# Patient Record
Sex: Male | Born: 1950 | Race: White | Hispanic: No | Marital: Married | Smoking: Never smoker
Health system: Southern US, Community
[De-identification: ages and names within clinical notes are randomized; demographics above are authoritative.]

## PROBLEM LIST (undated history)

## (undated) DIAGNOSIS — E7800 Pure hypercholesterolemia, unspecified: Secondary | ICD-10-CM

## (undated) DIAGNOSIS — R011 Cardiac murmur, unspecified: Secondary | ICD-10-CM

## (undated) DIAGNOSIS — Q631 Lobulated, fused and horseshoe kidney: Secondary | ICD-10-CM

## (undated) DIAGNOSIS — K279 Peptic ulcer, site unspecified, unspecified as acute or chronic, without hemorrhage or perforation: Secondary | ICD-10-CM

## (undated) DIAGNOSIS — N209 Urinary calculus, unspecified: Secondary | ICD-10-CM

## (undated) DIAGNOSIS — I10 Essential (primary) hypertension: Secondary | ICD-10-CM

## (undated) DIAGNOSIS — I2510 Atherosclerotic heart disease of native coronary artery without angina pectoris: Secondary | ICD-10-CM

## (undated) DIAGNOSIS — K219 Gastro-esophageal reflux disease without esophagitis: Secondary | ICD-10-CM

## (undated) DIAGNOSIS — M1990 Unspecified osteoarthritis, unspecified site: Secondary | ICD-10-CM

## (undated) HISTORY — PX: KIDNEY STONE SURGERY: SHX686

## (undated) HISTORY — DX: Essential (primary) hypertension: I10

## (undated) HISTORY — PX: VASECTOMY: SHX75

## (undated) HISTORY — PX: CARPAL TUNNEL RELEASE: SHX101

## (undated) HISTORY — PX: KNEE ARTHROSCOPY: SUR90

## (undated) HISTORY — PX: CARDIAC CATHETERIZATION: SHX172

## (undated) HISTORY — PX: COLONOSCOPY: SHX174

## (undated) HISTORY — PX: OTHER SURGICAL HISTORY: SHX169

## (undated) HISTORY — DX: Peptic ulcer, site unspecified, unspecified as acute or chronic, without hemorrhage or perforation: K27.9

## (undated) HISTORY — DX: Pure hypercholesterolemia, unspecified: E78.00

---

## 2008-10-22 HISTORY — PX: CORONARY ARTERY BYPASS GRAFT: SHX141

## 2009-03-10 ENCOUNTER — Ambulatory Visit (HOSPITAL_COMMUNITY): Admission: RE | Admit: 2009-03-10 | Discharge: 2009-03-10 | Payer: Self-pay | Admitting: Urology

## 2009-07-02 ENCOUNTER — Encounter: Payer: Self-pay | Admitting: Cardiovascular Disease

## 2009-07-06 ENCOUNTER — Ambulatory Visit: Payer: Self-pay | Admitting: Cardiovascular Disease

## 2009-07-06 DIAGNOSIS — R079 Chest pain, unspecified: Secondary | ICD-10-CM

## 2009-07-06 DIAGNOSIS — I10 Essential (primary) hypertension: Secondary | ICD-10-CM | POA: Insufficient documentation

## 2009-07-06 DIAGNOSIS — E782 Mixed hyperlipidemia: Secondary | ICD-10-CM | POA: Insufficient documentation

## 2009-07-11 ENCOUNTER — Encounter: Payer: Self-pay | Admitting: Cardiovascular Disease

## 2009-07-11 ENCOUNTER — Ambulatory Visit: Payer: Self-pay | Admitting: Cardiology

## 2009-07-19 ENCOUNTER — Ambulatory Visit: Payer: Self-pay | Admitting: Cardiology

## 2009-07-19 ENCOUNTER — Encounter: Payer: Self-pay | Admitting: Adult Health

## 2009-07-19 ENCOUNTER — Ambulatory Visit (HOSPITAL_COMMUNITY): Admission: RE | Admit: 2009-07-19 | Discharge: 2009-07-19 | Payer: Self-pay | Admitting: Cardiology

## 2009-07-19 ENCOUNTER — Encounter (INDEPENDENT_AMBULATORY_CARE_PROVIDER_SITE_OTHER): Payer: Self-pay | Admitting: *Deleted

## 2009-07-19 LAB — CONVERTED CEMR LAB
Basophils Absolute: 0.1 10*3/uL (ref 0.0–0.1)
CO2: 30 meq/L (ref 19–32)
INR: 0.9 (ref 0.0–1.5)
MCHC: 35.3 g/dL (ref 30.0–36.0)
MCV: 92.9 fL (ref 78.0–100.0)
RDW: 13.5 % (ref 11.5–15.5)
Sodium: 138 meq/L (ref 135–145)
WBC: 7.2 10*3/uL (ref 4.0–10.5)
aPTT: 30 s (ref 24–37)

## 2009-07-21 ENCOUNTER — Ambulatory Visit: Payer: Self-pay | Admitting: Surgery

## 2009-07-21 ENCOUNTER — Ambulatory Visit: Payer: Self-pay | Admitting: Cardiology

## 2009-07-21 ENCOUNTER — Inpatient Hospital Stay (HOSPITAL_COMMUNITY): Admission: AD | Admit: 2009-07-21 | Discharge: 2009-07-26 | Payer: Self-pay | Admitting: Cardiology

## 2009-07-21 ENCOUNTER — Encounter: Payer: Self-pay | Admitting: Surgery

## 2009-07-21 ENCOUNTER — Inpatient Hospital Stay (HOSPITAL_BASED_OUTPATIENT_CLINIC_OR_DEPARTMENT_OTHER): Admission: RE | Admit: 2009-07-21 | Discharge: 2009-07-21 | Payer: Self-pay | Admitting: Cardiology

## 2009-07-25 ENCOUNTER — Encounter: Payer: Self-pay | Admitting: Cardiology

## 2009-07-31 ENCOUNTER — Encounter: Payer: Self-pay | Admitting: Cardiovascular Disease

## 2009-08-02 ENCOUNTER — Ambulatory Visit: Payer: Self-pay | Admitting: Surgery

## 2009-08-16 ENCOUNTER — Encounter: Admission: RE | Admit: 2009-08-16 | Discharge: 2009-08-16 | Payer: Self-pay | Admitting: Surgery

## 2009-08-17 ENCOUNTER — Ambulatory Visit: Payer: Self-pay | Admitting: Cardiovascular Disease

## 2009-08-19 ENCOUNTER — Ambulatory Visit: Payer: Self-pay | Admitting: Surgery

## 2009-08-25 ENCOUNTER — Encounter: Payer: Self-pay | Admitting: Cardiovascular Disease

## 2009-09-26 ENCOUNTER — Encounter: Payer: Self-pay | Admitting: Cardiovascular Disease

## 2009-10-07 ENCOUNTER — Telehealth: Payer: Self-pay | Admitting: Cardiovascular Disease

## 2009-10-18 ENCOUNTER — Encounter (INDEPENDENT_AMBULATORY_CARE_PROVIDER_SITE_OTHER): Payer: Self-pay | Admitting: *Deleted

## 2009-10-22 HISTORY — PX: HERNIA REPAIR: SHX51

## 2009-10-27 ENCOUNTER — Telehealth (INDEPENDENT_AMBULATORY_CARE_PROVIDER_SITE_OTHER): Payer: Self-pay | Admitting: *Deleted

## 2009-10-31 ENCOUNTER — Encounter: Payer: Self-pay | Admitting: Cardiovascular Disease

## 2009-11-08 ENCOUNTER — Ambulatory Visit: Payer: Self-pay | Admitting: Cardiovascular Disease

## 2009-11-21 ENCOUNTER — Telehealth: Payer: Self-pay | Admitting: Cardiovascular Disease

## 2009-11-28 ENCOUNTER — Telehealth (INDEPENDENT_AMBULATORY_CARE_PROVIDER_SITE_OTHER): Payer: Self-pay | Admitting: *Deleted

## 2009-12-15 ENCOUNTER — Telehealth: Payer: Self-pay | Admitting: Cardiovascular Disease

## 2009-12-26 ENCOUNTER — Telehealth: Payer: Self-pay | Admitting: Cardiovascular Disease

## 2010-01-05 IMAGING — CR DG CHEST 2V
2 series · 2 of 2 positions shown · non-contrast
Comparison: Chest x-ray of 07/25/2009

CLINICAL DATA: As post CABG, follow-up

CHEST - 2 VIEW

[view not recorded (1 of 2)]
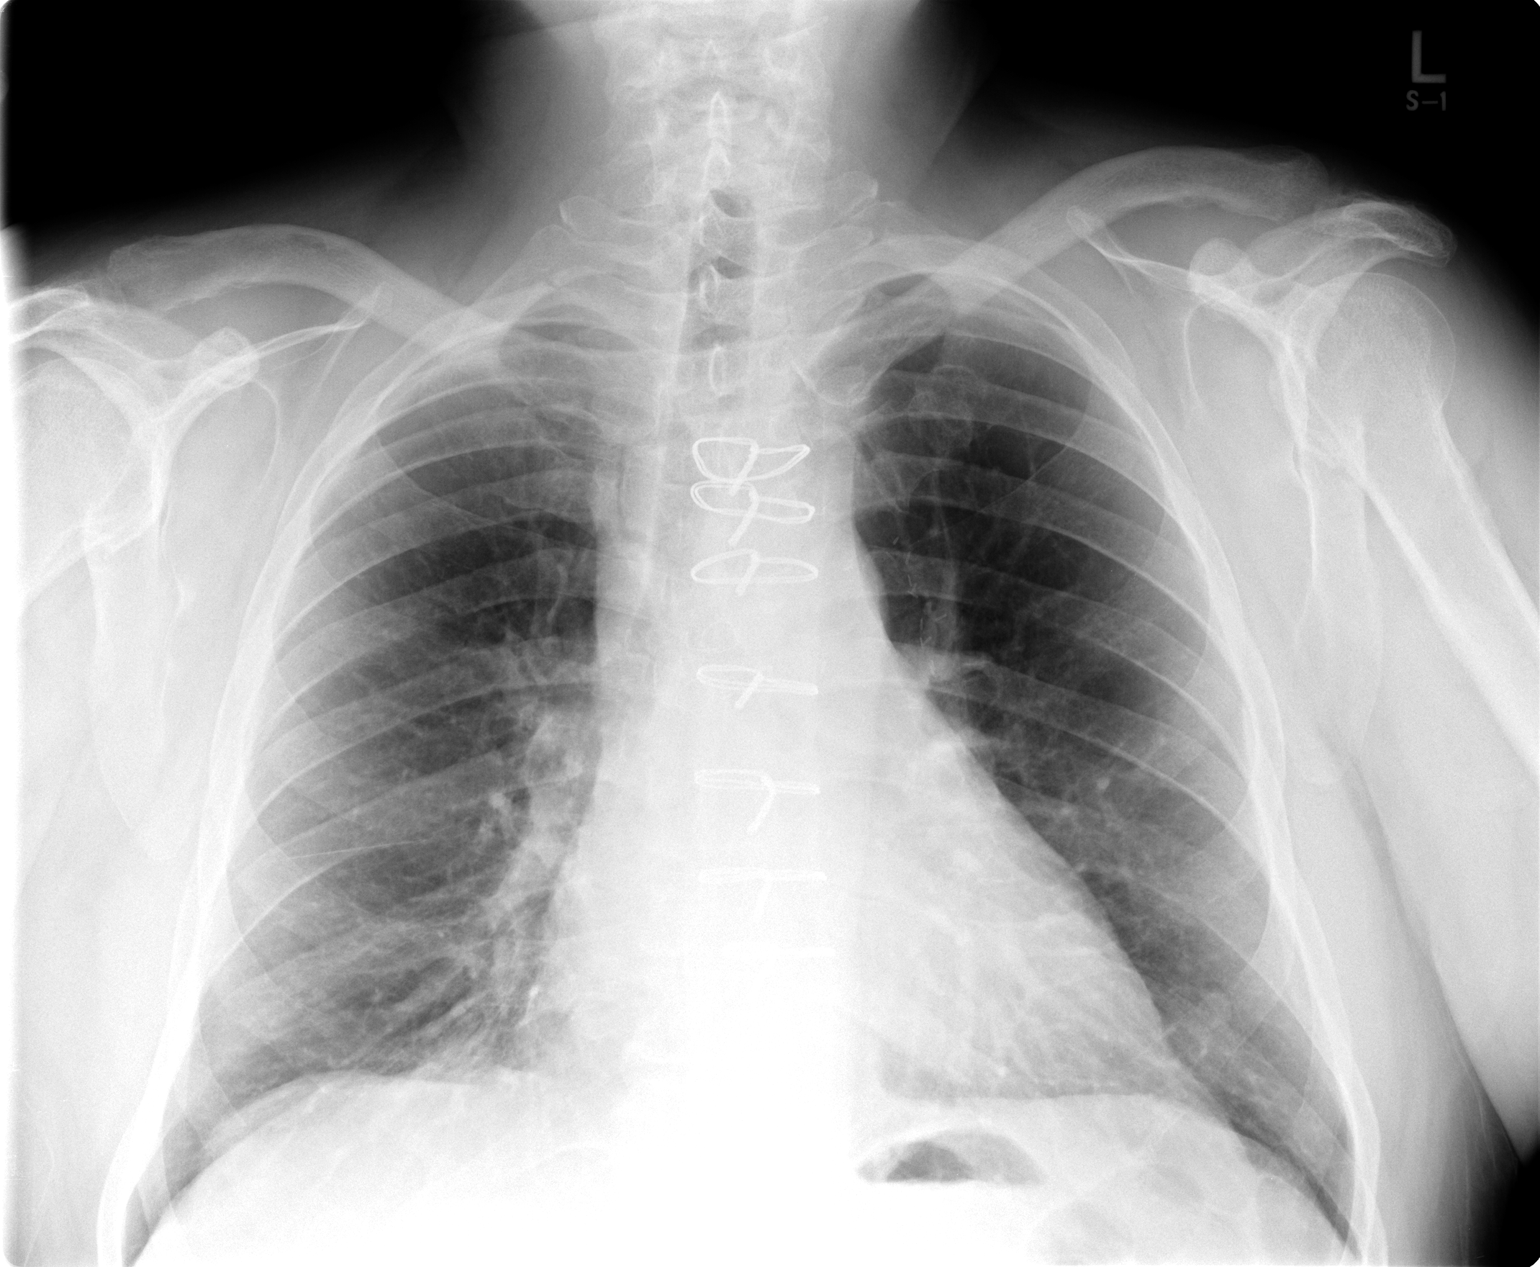

[view not recorded (2 of 2)]
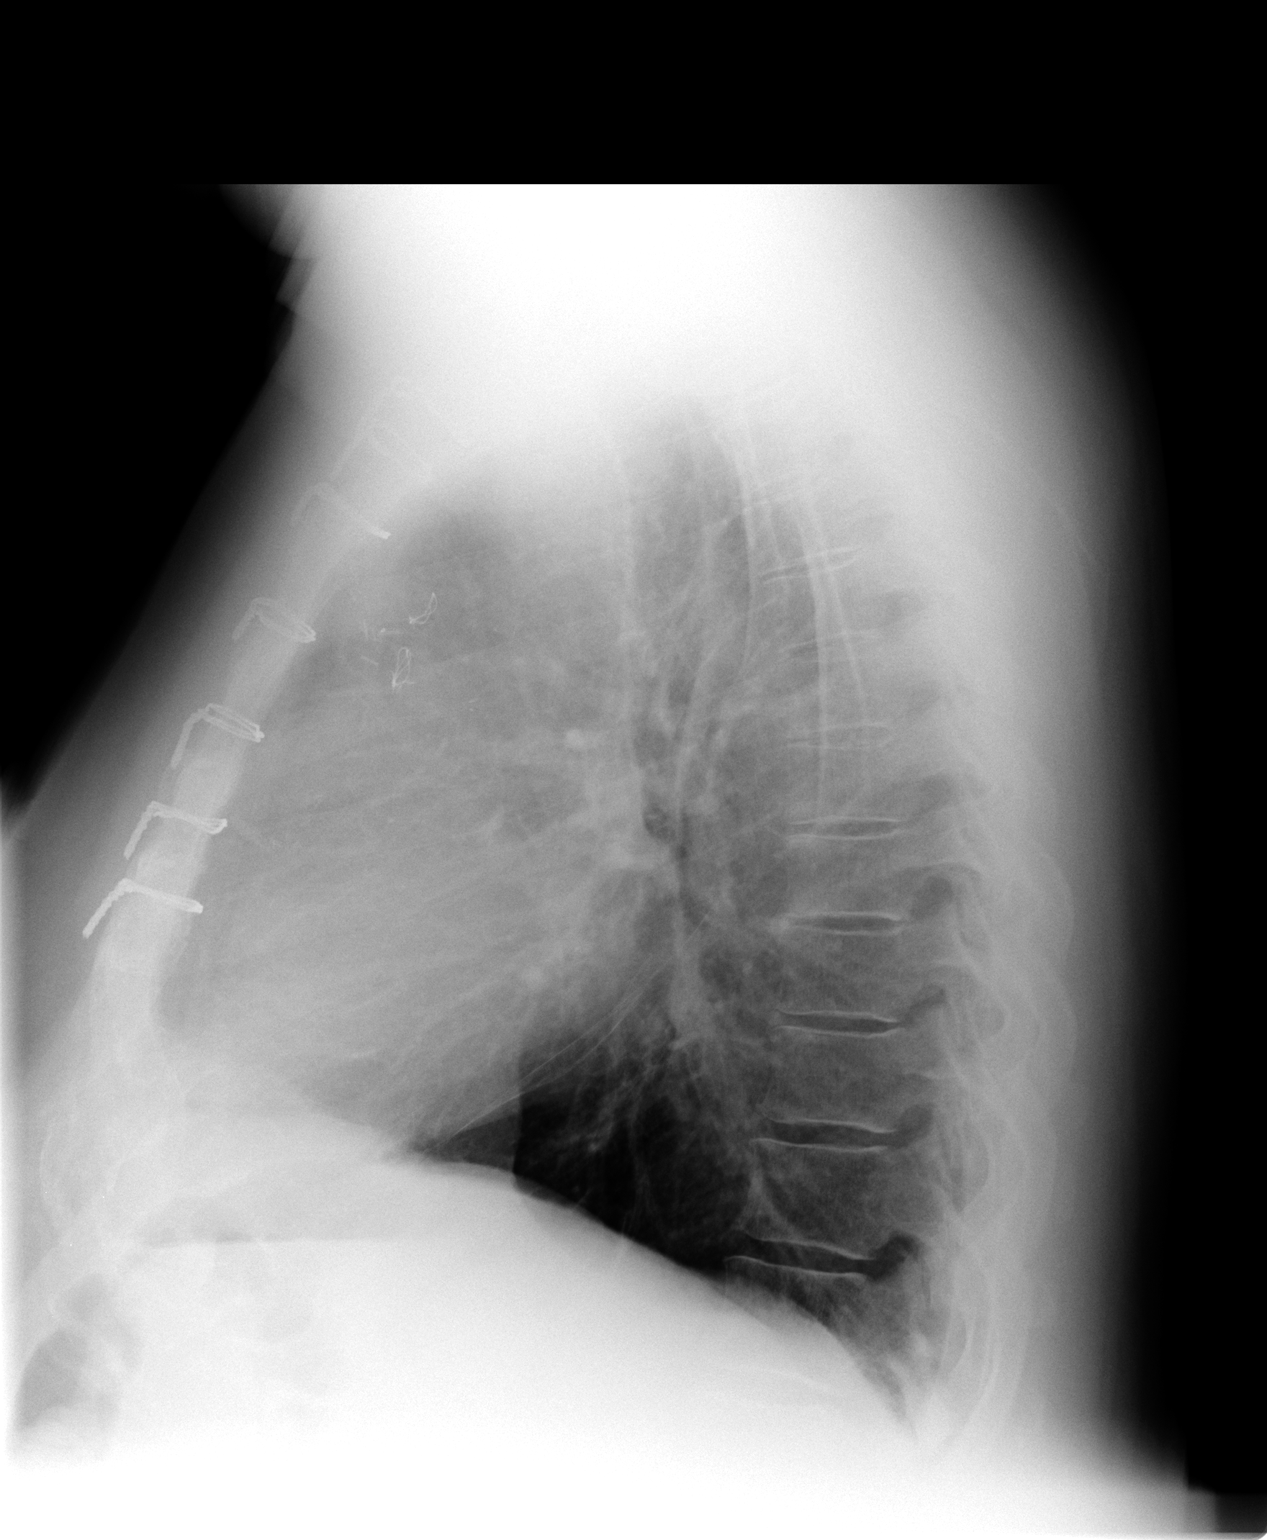

[2 of 2 positions shown; findings below may reference images not displayed]

FINDINGS: Aeration has improved.  There has been resolution of
bilateral pleural effusions and bibasilar atelectasis in the
interval.  Cardiomegaly is stable.  Median sternotomy sutures
appear intact.
IMPRESSION: Resolution of effusions and basilar atelectasis.

## 2010-05-08 ENCOUNTER — Ambulatory Visit: Payer: Self-pay | Admitting: Cardiovascular Disease

## 2010-11-21 NOTE — Progress Notes (Signed)
  recieved request for Records form [employer] forwarded to [place] for processing [facility]  November 28, 2009 10:22 AM

## 2010-11-21 NOTE — Progress Notes (Signed)
Summary: inflammatory meds  Phone Note Call from Patient Call back at Home Phone [phone]   Caller: Patient Reason for Call: Talk to Nurse Details for Reason: pt went to ortho md on last week for djd.. is possible for pt to go back on inflammatory meds.  Initial call taken by: Lorne Skeens,  November 21, 2009 9:11 AM  Follow-up for Phone Call        spoke with pt, okay given for him to take antiinflammatories. Deliah Goody, RN  November 21, 2009 10:09 AM

## 2010-11-21 NOTE — Progress Notes (Signed)
Summary: insurance letter/work  Phone Note Other Incoming Call back at [phone]   Caller: Arelina[employer] Summary of Call: checking on fax  she sent on 3/2 specialty letter for pt to go back to work Initial call taken by: Migdalia Dk,  December 26, 2009 9:07 AM  Follow-up for Phone Call        spoke with arelina, she will refax information Deliah Goody, RN  December 26, 2009 9:40 AM

## 2010-11-21 NOTE — Letter (Signed)
Summary: Dept. Of Transportation Request foe Additional Information  Dept. Of Transportation Request foe Additional Information   Imported By: Kassie Mends 11/10/2009 15:09:15  _____________________________________________________________________  External Attachment:    Type:   Image     Comment:   External Document

## 2010-11-21 NOTE — Assessment & Plan Note (Signed)
Summary: f48m/dm   Primary Provider:  Dr. Reuel Boom  CC:  chest pain.  History of Present Illness: Ricardo Jimenez is seen today post CABG 9/10 .  He was to have an outpatient cath but had resting pain and was cathed by Dr. Lillia Mountain with severe 3VD.  He had a total collaterilized LAD, 95% dominant circ and 90% non-dominant RCA that supplied his LAD collaterals.  He had a SVG to OM1, SVG OM3, SVG D1 and Lima to LAD.  I reviewed his cath films and hospital records.  He has recovered well andhas already been D/C by  Dr. Laneta Simmers . He is back working at [employer].   H e has some musculoskeletal pain but no dyspnea palpitations or edema.  he has been compliant with his meds. Dr Donzetta Sprung folows his lipids and LFTS twice a year.  He may need a left knee replacement He is cleared for surgery if need be.  He has had the right knee done in [place] before but will likely have this one in [place]  Current Problems (verified): 1)  Mixed Hyperlipidemia  (ICD-272.2) 2)  Essential Hypertension, Benign  (ICD-401.1) 3)  Chest Pain  (ICD-786.50)  Current Medications (verified): 1)  Multivitamins   Tabs (Multiple Vitamin) .[name].. 1 Tab By Mouth Once Daily 2)  Zantac 150 Mg Tabs (Ranitidine Hcl) .... Take 1 Tablet By Mouth Two Times A Day 3)  Fish Oil   Oil (Fish Oil) .[name].. 1 Tab By Mouth Once Daily 4)  Gabapentin 100 Mg Caps (Gabapentin) .... 6 Tabs By Mouth Once Daily 5)  Metoprolol Succinate 25 Mg Xr24h-Tab (Metoprolol Succinate) .[name].. 1 Tab By Mouth Two Times A Day 6)  Aspirin 325 Mg  Tabs (Aspirin) .[name].. 1 Tab By Mouth Once Daily 7)  Vytorin 10-40 Mg Tabs (Ezetimibe-Simvastatin) .... Take One Tablet By Mouth Dailyat Bedtime 8)  Colace 100 Mg Caps (Docusate Sodium) .... As Needed 9)  Tylenol Pm Extra Strength 500-25 Mg Tabs (Diphenhydramine-Apap (Sleep)) .... As Needed 10)  Tylenol Arthritis Pain 650 Mg Cr-Tabs (Acetaminophen) .... As Needed 11)  Potassium Chloride Cr 10 Meq Cr-Tabs (Potassium Chloride) .[name].. 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   Vital Signs:  Patient profile:   60 year old male Height:      69 inches Weight:      223 pounds BMI:     33.05 Pulse rate:   65 / minute Resp:     12 per minute BP sitting:   109 / 68  (left arm)  Vitals Entered By: Kem Parkinson (May 08, 2010 9:32 AM)  Physical Exam  General:  Affect appropriate Healthy:  appears stated age HEENT: normal Neck supple with no adenopathy JVP normal no bruits no thyromegaly Lungs clear with no wheezing and good diaphragmatic motion Heart:  S1/S2 no murmur,rub, gallop or click PMI normal Abdomen: benighn, BS positve, no tenderness, no AAA  no bruit.  No HSM or HJR Distal pulses intact with no bruits No edema Neuro non-focal Skin warm and dry S/P RTKR   Impression & Recommendations:  Problem # 1:  MIXED HYPERLIPIDEMIA (ICD-272.2) Continue statin labs per Dr Reuel Boom His updated medication list for this problem includes:    Vytorin 10-40 Mg Tabs (Ezetimibe-simvastatin) .[name]... Take one tablet by mouth dailyat bedtime  Problem # 2:  ESSENTIAL HYPERTENSION, BENIGN (ICD-401.1) Well controlled His updated medication list for this problem includes:    Metoprolol Succinate 25 Mg Xr24h-tab (Metoprolol succinate) .[name]... 1 tab by  mouth two times a day    Aspirin 325 Mg Tabs (Aspirin) .[name]... 1 tab by mouth once daily  Orders: EKG w/ Interpretation (93000)  Problem # 3:  CHEST PAIN (ICD-786.50) CABG 9/10.  Normal ECG with no angina His updated medication list for this problem includes:    Metoprolol Succinate 25 Mg Xr24h-tab (Metoprolol succinate) .[name]... 1 tab by mouth two times a day    Aspirin 325 Mg Tabs (Aspirin) .[name]... 1 tab by mouth once daily  Patient Instructions: 1)  Your physician recommends that you schedule a follow-up appointment in: YEAR WITH DR [place] Emms 2)  Your physician recommends that you continue on your current medications as directed. Please refer to the Current Medication list given to you today.   EKG Report  Procedure date:  05/08/2010  Findings:      NSR 65 Normal ECG

## 2010-11-21 NOTE — Progress Notes (Signed)
Summary: speak to nurse  Phone Note Call from Patient Call back at Home Phone [phone]   Caller: Patient Reason for Call: Talk to Nurse Summary of Call: req to speak to nurse Initial call taken by: Migdalia Dk,  December 15, 2009 10:46 AM  Follow-up for Phone Call        Called [employer]...form to Monia Pouch was signed on 12/07/2009 and mailed...patient aware. Follow-up by: Suzan Garibaldi RN

## 2010-11-21 NOTE — Progress Notes (Signed)
  Walk in Patient Form Recieved " Patient left Department of transporation Medical Examination" paper forwarded to [facility]. [facility] [place]  October 27, 2009 3:21 PM

## 2010-11-21 NOTE — Assessment & Plan Note (Signed)
Summary: F3M/DM   Primary Provider:  Dr. Reuel Boom  CC:  sob also pain in left arm.  History of Present Illness: Ricardo Jimenez is seen today post CABG.  He was to have an outpatient cath but had resting pain and was cathed by Dr. Lillia Mountain with severe 3VD.  He had a total collaterilized LAD, 95% dominant circ and 90% non-dominant RCA that supplied his LAD collaterals.  He had a SVG to OM1, SVG OM3, SVG D1 and Lima to LAD.  I reviewed his cath films and hospital records.  He has recovered well andhas already been D/C by  Dr. Laneta Simmers . He is back working at [employer].   H e has some musculoskeletal pain but no dyspnea palpitations or edema.  he has been compliant with his meds.  Current Problems (verified): 1)  Mixed Hyperlipidemia  (ICD-272.2) 2)  Essential Hypertension, Benign  (ICD-401.1) 3)  Chest Pain  (ICD-786.50)  Current Medications (verified): 1)  Multivitamins   Tabs (Multiple Vitamin) .[name].. 1 Tab By Mouth Once Daily 2)  Zantac 150 Mg Tabs (Ranitidine Hcl) .... Take 1 Tablet By Mouth Two Times A Day 3)  Fish Oil   Oil (Fish Oil) .[name].. 1 Tab By Mouth Once Daily 4)  Gabapentin 600 Mg Tabs (Gabapentin) .[name].. 1 Tab By Mouth Once Daily 5)  Metoprolol Succinate 25 Mg Xr24h-Tab (Metoprolol Succinate) .[name].. 1 Tab By Mouth Two Times A Day 6)  Aspirin 325 Mg  Tabs (Aspirin) .[name].. 1 Tab By Mouth Once Daily 7)  Vytorin 10-40 Mg Tabs (Ezetimibe-Simvastatin) .... Take One Tablet By Mouth Dailyat Bedtime 8)  Oxycodone-Acetaminophen 5-500 Mg Caps (Oxycodone-Acetaminophen) .... As Needed 9)  Colace 100 Mg Caps (Docusate Sodium) .... As Needed 10)  Tylenol Pm Extra Strength 500-25 Mg Tabs (Diphenhydramine-Apap (Sleep)) .... As Needed 11)  Tylenol Arthritis Pain 650 Mg Cr-Tabs (Acetaminophen) .... As Needed  Allergies (verified): No Known Drug Allergies  Past History:  Past Medical History: Last updated: 07/06/2009 Bilatateral Carpal Tunnel Surgery Right knee  replacement Arthritis HTN Hypercholesterolemia PUD  Past Surgical History: cath 07/21/2009 CABG  Review of Systems       Denies fever, malais, weight loss, blurry vision, decreased visual acuity, cough, sputum, SOB, hemoptysis, pleuritic pain, palpitaitons, heartburn, abdominal pain, melena, lower extremity edema, claudication, or rash. all other sytems reviewed and negative  Vital Signs:  Patient profile:   60 year old male Height:      69 inches Weight:      225 pounds BMI:     33.35 Pulse rate:   81 / minute Resp:     12 per minute BP sitting:   111 / 74  (left arm)  Vitals Entered By: Kem Parkinson (November 08, 2009 2:52 PM)  Physical Exam  General:  Affect appropriate Healthy:  appears stated age HEENT: normal Neck supple with no adenopathy Recent right hernia surgery with moderate calcified hematoma under incision and no signs of  infection JVP normal no bruits no thyromegaly Lungs clear with no wheezing and good diaphragmatic motion Heart:  S1/S2 no murmur,rub, gallop or click PMI normal Abdomen: benighn, BS positve, no tenderness, no AAA no bruit.  No HSM or HJR Distal pulses intact with no bruits No edema Neuro non-focal Skin warm and dry    Impression & Recommendations:  Problem # 1:  CHEST PAIN (ICD-786.50) S/P CABG 04/2009.  Normal LV function.  Activity currently limited by recent hernia surgery His updated medication list for this problem includes:    Metoprolol  Succinate 25 Mg Xr24h-tab (Metoprolol succinate) .[name]... 1 tab by mouth two times a day    Aspirin 325 Mg Tabs (Aspirin) .[name]... 1 tab by mouth once daily  Problem # 2:  ESSENTIAL HYPERTENSION, BENIGN (ICD-401.1) Well controlled continue low soidum diet His updated medication list for this problem includes:    Metoprolol Succinate 25 Mg Xr24h-tab (Metoprolol succinate) .[name]... 1 tab by mouth two times a day    Aspirin 325 Mg Tabs (Aspirin) .[name]... 1 tab by mouth once daily  Problem # 3:   MIXED HYPERLIPIDEMIA (ICD-272.2) Lipid and liver in 6 months His updated medication list for this problem includes:    Vytorin 10-40 Mg Tabs (Ezetimibe-simvastatin) .[name]... Take one tablet by mouth dailyat bedtime  Patient Instructions: 1)  Your physician recommends that you schedule a follow-up appointment in: 6 months

## 2011-01-25 LAB — CBC
HCT: 34.4 % — ABNORMAL LOW (ref 39.0–52.0)
HCT: 34.8 % — ABNORMAL LOW (ref 39.0–52.0)
HCT: 35.3 % — ABNORMAL LOW (ref 39.0–52.0)
HCT: 36.4 % — ABNORMAL LOW (ref 39.0–52.0)
HCT: 44.5 % (ref 39.0–52.0)
Hemoglobin: 11.1 g/dL — ABNORMAL LOW (ref 13.0–17.0)
Hemoglobin: 12 g/dL — ABNORMAL LOW (ref 13.0–17.0)
Hemoglobin: 12.1 g/dL — ABNORMAL LOW (ref 13.0–17.0)
Hemoglobin: 12.2 g/dL — ABNORMAL LOW (ref 13.0–17.0)
Hemoglobin: 15.7 g/dL (ref 13.0–17.0)
MCHC: 34.4 g/dL (ref 30.0–36.0)
MCHC: 35 g/dL (ref 30.0–36.0)
MCHC: 35.1 g/dL (ref 30.0–36.0)
MCHC: 35.3 g/dL (ref 30.0–36.0)
MCV: 92.9 fL (ref 78.0–100.0)
MCV: 93.1 fL (ref 78.0–100.0)
MCV: 93.3 fL (ref 78.0–100.0)
MCV: 93.3 fL (ref 78.0–100.0)
MCV: 94.2 fL (ref 78.0–100.0)
MCV: 94.8 fL (ref 78.0–100.0)
Platelets: 110 10*3/uL — ABNORMAL LOW (ref 150–400)
Platelets: 122 10*3/uL — ABNORMAL LOW (ref 150–400)
Platelets: 126 10*3/uL — ABNORMAL LOW (ref 150–400)
Platelets: 130 10*3/uL — ABNORMAL LOW (ref 150–400)
Platelets: 144 10*3/uL — ABNORMAL LOW (ref 150–400)
RBC: 3.41 MIL/uL — ABNORMAL LOW (ref 4.22–5.81)
RBC: 3.72 MIL/uL — ABNORMAL LOW (ref 4.22–5.81)
RBC: 3.73 MIL/uL — ABNORMAL LOW (ref 4.22–5.81)
RBC: 3.74 MIL/uL — ABNORMAL LOW (ref 4.22–5.81)
RDW: 13.1 % (ref 11.5–15.5)
RDW: 13.2 % (ref 11.5–15.5)
RDW: 13.3 % (ref 11.5–15.5)
RDW: 13.6 % (ref 11.5–15.5)
RDW: 13.7 % (ref 11.5–15.5)
WBC: 10.2 10*3/uL (ref 4.0–10.5)
WBC: 6.3 10*3/uL (ref 4.0–10.5)
WBC: 8.8 10*3/uL (ref 4.0–10.5)
WBC: 8.9 10*3/uL (ref 4.0–10.5)
WBC: 9.1 10*3/uL (ref 4.0–10.5)
WBC: 9.2 10*3/uL (ref 4.0–10.5)

## 2011-01-25 LAB — COMPREHENSIVE METABOLIC PANEL
ALT: 27 U/L (ref 0–53)
Albumin: 3.8 g/dL (ref 3.5–5.2)
BUN: 14 mg/dL (ref 6–23)
CO2: 26 mEq/L (ref 19–32)
Chloride: 100 mEq/L (ref 96–112)
Creatinine, Ser: 1 mg/dL (ref 0.4–1.5)
GFR calc Af Amer: >60 mL/min (ref >60)
Sodium: 136 mEq/L (ref 135–145)
Total Bilirubin: 1.6 mg/dL — ABNORMAL HIGH (ref 0.3–1.2)
Total Protein: 6.8 g/dL (ref 6.0–8.3)

## 2011-01-25 LAB — PROTIME-INR: INR: 1.3 (ref 0.00–1.49)

## 2011-01-25 LAB — CREATININE, SERUM
GFR calc Af Amer: >60 mL/min (ref >60)
GFR calc non Af Amer: >60 mL/min (ref >60)

## 2011-01-25 LAB — POCT I-STAT 4, (NA,K, GLUC, HGB,HCT)
Glucose, Bld: 107 mg/dL — ABNORMAL HIGH (ref 70–99)
Glucose, Bld: 120 mg/dL — ABNORMAL HIGH (ref 70–99)
Glucose, Bld: 93 mg/dL (ref 70–99)
Glucose, Bld: 97 mg/dL (ref 70–99)
HCT: 27 % — ABNORMAL LOW (ref 39.0–52.0)
HCT: 39 % (ref 39.0–52.0)
HCT: 42 % (ref 39.0–52.0)
Hemoglobin: 10.9 g/dL — ABNORMAL LOW (ref 13.0–17.0)
Hemoglobin: 9.2 g/dL — ABNORMAL LOW (ref 13.0–17.0)
Hemoglobin: 9.9 g/dL — ABNORMAL LOW (ref 13.0–17.0)
Potassium: 3.8 mEq/L (ref 3.5–5.1)
Potassium: 4.1 mEq/L (ref 3.5–5.1)
Potassium: 4.9 mEq/L (ref 3.5–5.1)
Potassium: 5.4 mEq/L — ABNORMAL HIGH (ref 3.5–5.1)
Sodium: 129 mEq/L — ABNORMAL LOW (ref 135–145)
Sodium: 131 mEq/L — ABNORMAL LOW (ref 135–145)
Sodium: 138 mEq/L (ref 135–145)

## 2011-01-25 LAB — POCT I-STAT, CHEM 8
BUN: 17 mg/dL (ref 6–23)
Calcium, Ion: 1.16 mmol/L (ref 1.12–1.32)
Chloride: 104 mEq/L (ref 96–112)
Chloride: 99 mEq/L (ref 96–112)
Creatinine, Ser: 0.9 mg/dL (ref 0.4–1.5)
Glucose, Bld: 126 mg/dL — ABNORMAL HIGH (ref 70–99)
HCT: 34 % — ABNORMAL LOW (ref 39.0–52.0)
Hemoglobin: 11.6 g/dL — ABNORMAL LOW (ref 13.0–17.0)
Potassium: 4.8 mEq/L (ref 3.5–5.1)
TCO2: 24 mmol/L (ref 0–100)

## 2011-01-25 LAB — GLUCOSE, CAPILLARY
Glucose-Capillary: 117 mg/dL — ABNORMAL HIGH (ref 70–99)
Glucose-Capillary: 117 mg/dL — ABNORMAL HIGH (ref 70–99)
Glucose-Capillary: 123 mg/dL — ABNORMAL HIGH (ref 70–99)

## 2011-01-25 LAB — BASIC METABOLIC PANEL
BUN: 13 mg/dL (ref 6–23)
BUN: 13 mg/dL (ref 6–23)
BUN: 20 mg/dL (ref 6–23)
CO2: 26 mEq/L (ref 19–32)
CO2: 29 mEq/L (ref 19–32)
Calcium: 8.1 mg/dL — ABNORMAL LOW (ref 8.4–10.5)
Calcium: 8.1 mg/dL — ABNORMAL LOW (ref 8.4–10.5)
Chloride: 104 mEq/L (ref 96–112)
Chloride: 96 mEq/L (ref 96–112)
Chloride: 97 mEq/L (ref 96–112)
Creatinine, Ser: 0.88 mg/dL (ref 0.4–1.5)
Creatinine, Ser: 0.89 mg/dL (ref 0.4–1.5)
Creatinine, Ser: 1 mg/dL (ref 0.4–1.5)
GFR calc Af Amer: >60 mL/min (ref >60)
GFR calc Af Amer: >60 mL/min (ref >60)
GFR calc non Af Amer: >60 mL/min (ref >60)
GFR calc non Af Amer: >60 mL/min (ref >60)
GFR calc non Af Amer: >60 mL/min (ref >60)
Glucose, Bld: 105 mg/dL — ABNORMAL HIGH (ref 70–99)
Glucose, Bld: 133 mg/dL — ABNORMAL HIGH (ref 70–99)
Glucose, Bld: 141 mg/dL — ABNORMAL HIGH (ref 70–99)
Potassium: 4.2 mEq/L (ref 3.5–5.1)
Potassium: 4.3 mEq/L (ref 3.5–5.1)
Sodium: 131 mEq/L — ABNORMAL LOW (ref 135–145)
Sodium: 132 mEq/L — ABNORMAL LOW (ref 135–145)

## 2011-01-25 LAB — MAGNESIUM
Magnesium: 2.2 mg/dL (ref 1.5–2.5)
Magnesium: 2.2 mg/dL (ref 1.5–2.5)
Magnesium: 2.7 mg/dL — ABNORMAL HIGH (ref 1.5–2.5)

## 2011-01-25 LAB — POCT I-STAT 3, ART BLOOD GAS (G3+)
Bicarbonate: 24.6 mEq/L — ABNORMAL HIGH (ref 20.0–24.0)
O2 Saturation: 95 %
O2 Saturation: 96 %
TCO2: 26 mmol/L (ref 0–100)
pCO2 arterial: 33.8 mmHg — ABNORMAL LOW (ref 35.0–45.0)
pCO2 arterial: 44.6 mmHg (ref 35.0–45.0)
pO2, Arterial: 327 mmHg — ABNORMAL HIGH (ref 80.0–100.0)
pO2, Arterial: 69 mmHg — ABNORMAL LOW (ref 80.0–100.0)
pO2, Arterial: 86 mmHg (ref 80.0–100.0)

## 2011-01-25 LAB — POCT I-STAT GLUCOSE: Operator id: 3406

## 2011-01-25 LAB — APTT: aPTT: 32 seconds (ref 24–37)

## 2011-01-25 LAB — TSH: TSH: 5.598 u[IU]/mL — ABNORMAL HIGH (ref 0.350–4.500)

## 2011-01-25 LAB — HEMOGLOBIN AND HEMATOCRIT, BLOOD
HCT: 29.9 % — ABNORMAL LOW (ref 39.0–52.0)
Hemoglobin: 10.4 g/dL — ABNORMAL LOW (ref 13.0–17.0)

## 2011-01-26 LAB — BLOOD GAS, ARTERIAL
pCO2 arterial: 32.4 mmHg — ABNORMAL LOW (ref 35.0–45.0)
pH, Arterial: 7.463 — ABNORMAL HIGH (ref 7.350–7.450)
pO2, Arterial: 96.4 mmHg (ref 80.0–100.0)

## 2011-01-26 LAB — CARDIAC PANEL(CRET KIN+CKTOT+MB+TROPI)
CK, MB: 1.6 ng/mL (ref 0.3–4.0)
Total CK: 114 U/L (ref 7–232)
Troponin I: 0.01 ng/mL (ref 0.00–0.06)

## 2011-01-26 LAB — PROTIME-INR
INR: 1 (ref 0.00–1.49)
Prothrombin Time: 12.8 seconds (ref 11.6–15.2)

## 2011-01-26 LAB — TYPE AND SCREEN: Antibody Screen: NEGATIVE

## 2011-01-30 LAB — BASIC METABOLIC PANEL
CO2: 27 mEq/L (ref 19–32)
Chloride: 107 mEq/L (ref 96–112)
GFR calc non Af Amer: >60 mL/min (ref >60)
Glucose, Bld: 106 mg/dL — ABNORMAL HIGH (ref 70–99)
Potassium: 4.7 mEq/L (ref 3.5–5.1)
Sodium: 140 mEq/L (ref 135–145)

## 2011-01-30 LAB — URINALYSIS, MICROSCOPIC ONLY
Ketones, ur: NEGATIVE mg/dL
Nitrite: NEGATIVE
Protein, ur: NEGATIVE mg/dL
Urobilinogen, UA: 0.2 mg/dL (ref 0.0–1.0)

## 2011-01-30 LAB — CBC
HCT: 44.7 % (ref 39.0–52.0)
Hemoglobin: 15.6 g/dL (ref 13.0–17.0)
MCV: 92.4 fL (ref 78.0–100.0)
RDW: 13.3 % (ref 11.5–15.5)

## 2011-02-04 ENCOUNTER — Other Ambulatory Visit: Payer: Self-pay | Admitting: Cardiology

## 2011-02-06 NOTE — Telephone Encounter (Signed)
Dr. Nishan pt. 

## 2011-02-21 ENCOUNTER — Encounter: Payer: Self-pay | Admitting: Cardiovascular Disease

## 2011-02-26 ENCOUNTER — Ambulatory Visit (INDEPENDENT_AMBULATORY_CARE_PROVIDER_SITE_OTHER): Payer: BC Managed Care – PPO | Admitting: Cardiovascular Disease

## 2011-02-26 ENCOUNTER — Encounter: Payer: Self-pay | Admitting: Cardiovascular Disease

## 2011-02-26 DIAGNOSIS — R079 Chest pain, unspecified: Secondary | ICD-10-CM

## 2011-02-26 DIAGNOSIS — E782 Mixed hyperlipidemia: Secondary | ICD-10-CM

## 2011-02-26 DIAGNOSIS — I10 Essential (primary) hypertension: Secondary | ICD-10-CM

## 2011-02-26 MED ORDER — NITROGLYCERIN 0.4 MG SL SUBL: 0.4000 mg | SUBLINGUAL_TABLET | SUBLINGUAL | Status: DC | PRN

## 2011-02-26 NOTE — Progress Notes (Signed)
Ricardo Jimenez is seen today post CABG 9/10 . He had a total collaterilized LAD, 95% dominant circ and 90% non-dominant RCA that supplied his LAD collaterals.  He had a SVG to OM1, SVG OM3, SVG D1 and Lima to LAD.  I reviewed his cath films and hospital records. [name] He is  working at [employer].  He has some musculoskeletal pain but no dyspnea palpitations or edema.  He has been compliant with his meds. Dr Donzetta Sprung folows his lipids and LFTS twice a year.  Has now had both knees replaced. Seen in ER [facility] two weeks ago and kept overnight.  R/O.  Pain sounded muscular.  Since ECG is normal can have F/U treadmill.    ROS: Denies fever, malais, weight loss, blurry vision, decreased visual acuity, cough, sputum, SOB, hemoptysis, pleuritic pain, palpitaitons, heartburn, abdominal pain, melena, lower extremity edema, claudication, or rash.   General: Affect appropriate Healthy:  appears stated age HEENT: normal Neck supple with no adenopathy JVP normal no bruits no thyromegaly Lungs clear with no wheezing and good diaphragmatic motion Heart:  S1/S2 no murmur,rub, gallop or click PMI normal Abdomen: benighn, BS positve, no tenderness, no AAA no bruit.  No HSM or HJR Distal pulses intact with no bruits No edema Neuro non-focal Skin warm and dry No muscular weakness   Current Outpatient Prescriptions  Medication Sig Dispense Refill  . acetaminophen (TYLENOL ARTHRITIS PAIN) 650 MG CR tablet Take 650 mg by mouth as needed.        [name] aspirin 325 MG tablet Take 325 mg by mouth daily.        . diphenhydramine-acetaminophen (TYLENOL PM EXTRA STRENGTH) 25-500 MG TABS as needed.        . docusate sodium (COLACE) 100 MG capsule Take 100 mg by mouth as needed.        . ezetimibe-simvastatin (VYTORIN) 10-40 MG per tablet Take 1 tablet by mouth at bedtime.        . fish oil-omega-3 fatty acids 1000 MG capsule Take 1 g by mouth daily.        [name] gabapentin (NEURONTIN) 100 MG capsule 6 tabs by mouth once daily       .  metoprolol succinate (TOPROL-XL) 25 MG 24 hr tablet Take 25 mg by mouth 2 (two) times daily.        . Multiple Vitamin (MULTIVITAMIN) tablet Take 1 tablet by mouth daily.        . potassium chloride (MICRO-K) 10 MEQ CR capsule Take 10 mEq by mouth 2 (two) times daily.        . ranitidine (ZANTAC) 150 MG tablet take 1 tablet by mouth twice a day  60 tablet  4    Allergies  Review of patient's allergies indicates no known allergies.  Electrocardiogram:  NSR normal ECG  Assessment and Plan

## 2011-02-26 NOTE — Patient Instructions (Signed)
Your physician has requested that you have an exercise tolerance test. For further information please visit [email or web address]. Please also follow instruction sheet, as given.  Your physician wants you to follow-up in: ONE YEAR You will receive a reminder letter in the mail two months in advance. If you don't receive a letter, please call our office to schedule the follow-up appointment.  

## 2011-02-26 NOTE — Assessment & Plan Note (Signed)
Well controlled.  Continue current medications and low sodium Dash type diet.    

## 2011-02-26 NOTE — Assessment & Plan Note (Signed)
Cholesterol is at goal.  Continue current dose of statin and diet Rx.  No myalgias or side effects.  F/U  LFT's in 6 months. No results found for this basename: LDLCALC   F/U labs per Dr Donzetta Sprung

## 2011-02-26 NOTE — Assessment & Plan Note (Signed)
Atypical. Grafts less than 61 years old.  Will call in new nitro.  F/U ETT

## 2011-03-06 NOTE — Assessment & Plan Note (Signed)
OFFICE VISIT   PELHAM, HENNICK  DOB:  Mar 16, 1951                                        August 19, 2009  CHART #:  [id number]   The patient returned today for followup status post coronary artery  bypass graft x4 on July 22, 2009.  He has seen Dr. Charlton Haws in  followup and no changes were made to his medical regimen.  He has been  walking daily without chest pain or shortness of breath.   PHYSICAL EXAMINATION:  Vital Signs:  Today, blood pressure 110/70, pulse  is 79 and regular, respiratory rate is 18 and unlabored, and oxygen  saturation is 98% on room air.  General:  He looks well.  Cardiac:  Regular rate and rhythm with normal heart sounds.  Lungs:  Clear.  Chest:  Chest incision is healing well and the sternum is stable.  Extremities:  His leg incision is healing well and there is no lower  extremity edema.   Followup chest x-ray shows clear lung fields and no pleural effusions.   MEDICATIONS:  1. Aspirin 325 mg daily.  2. Lasix 40 mg daily.  3. Lopressor 25 mg b.i.d.  4. Potassium 20 mEq daily.  5. Oxycodone 5 mg p.r.n. for pain.  6. Colace 100 mg daily.  7. Fish oil 2000 mg daily.  8. Gabapentin 600 mg at bedtime.  9. Vytorin 10/40 at bedtime.  10.Zantac 150 mg b.i.d.  11.Multivitamin daily.   IMPRESSION:  Overall, the patient is recovering well following his  surgery.  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  He is planning on following up with Dr. Charlton Haws in January 2011.  He will return to see me if he has any problems  with his incisions.   Evelene Croon, M.D.  Electronically Signed   BB/MEDQ  D:  08/19/2009  T:  08/20/2009  Job:  [id number]

## 2011-04-09 ENCOUNTER — Ambulatory Visit (INDEPENDENT_AMBULATORY_CARE_PROVIDER_SITE_OTHER): Payer: BC Managed Care – PPO | Admitting: Cardiovascular Disease

## 2011-04-09 DIAGNOSIS — R079 Chest pain, unspecified: Secondary | ICD-10-CM

## 2011-04-09 NOTE — Progress Notes (Signed)
Exercise Treadmill Test  Pre-Exercise Testing Evaluation Rhythm: normal sinus  Rate: 64   PR:  .15 QRS:  .09  QT:  .41 QTc: .42     Test  Exercise Tolerance Test Ordering MD: Charlton Haws, MD  Interpreting MD:  Charlton Haws, MD  Unique Test No: 1  Treadmill:  1  Indication for ETT: chest pain - rule out ischemia  Contraindication to ETT: No   Stress Modality: exercise - treadmill  Cardiac Imaging Performed: non   Protocol: standard Bruce - maximal  Max BP:  192 46  Max MPHR (bpm):  160 85% MPR (bpm):  136  MPHR obtained (bpm):  133 % MPHR obtained:  88%  Reached 85% MPHR (min:sec):   Total Exercise Time (min-sec):  6:30  Workload in METS:  10.8 Borg Scale: 18  Reason ETT Terminated:  fatigue    ST Segment Analysis At Rest: normal ST segments - no evidence of significant ST depression With Exercise: no evidence of significant ST depression  Other Information Arrhythmia:  No Angina during ETT:  absent (0) Quality of ETT:  diagnostic  ETT Interpretation:  normal - no evidence of ischemia by ST analysis  Comments: 2-5 beats of NSVT in mid exercise.  No angina. Study done on Toprol.    Recommendations: Continue medical Rx  Charlton Haws

## 2011-07-21 ENCOUNTER — Other Ambulatory Visit: Payer: Self-pay | Admitting: Cardiovascular Disease

## 2011-12-14 ENCOUNTER — Other Ambulatory Visit: Payer: Self-pay | Admitting: Cardiovascular Disease

## 2012-01-01 ENCOUNTER — Encounter: Payer: Self-pay | Admitting: Cardiovascular Disease

## 2012-01-01 ENCOUNTER — Ambulatory Visit (INDEPENDENT_AMBULATORY_CARE_PROVIDER_SITE_OTHER): Payer: BC Managed Care – PPO | Admitting: Cardiovascular Disease

## 2012-01-01 DIAGNOSIS — I10 Essential (primary) hypertension: Secondary | ICD-10-CM

## 2012-01-01 DIAGNOSIS — Z01810 Encounter for preprocedural cardiovascular examination: Secondary | ICD-10-CM

## 2012-01-01 DIAGNOSIS — E782 Mixed hyperlipidemia: Secondary | ICD-10-CM

## 2012-01-01 NOTE — Assessment & Plan Note (Signed)
CABG 2010  Normal ETT last June No SSCP Clear to have knee surgery

## 2012-01-01 NOTE — Assessment & Plan Note (Signed)
Well controlled.  Continue current medications and low sodium Dash type diet.    

## 2012-01-01 NOTE — Patient Instructions (Signed)
Your physician wants you to follow-up in:  6 MONTHS WITH DR NISHAN  You will receive a reminder letter in the mail two months in advance. If you don't receive a letter, please call our office to schedule the follow-up appointment. Your physician recommends that you continue on your current medications as directed. Please refer to the Current Medication list given to you today. 

## 2012-01-01 NOTE — Progress Notes (Signed)
Ricardo Jimenez is seen today post CABG 9/10 . He had a total collaterilized LAD, 95% dominant circ and 90% non-dominant RCA that supplied his LAD collaterals. He had a SVG to OM1, SVG OM3, SVG D1 and Lima to LAD. I reviewed his cath films and hospital records. [name] He is working at [employer]. He has some musculoskeletal pain but no dyspnea palpitations or edema. He has been compliant with his meds. Dr Donzetta Sprung folows his lipids and LFTS twice a year. Has now had both knees replaced. Seen in [facility] two weeks ago and kept overnight. R/O. Pain sounded muscular. Since ECG is normal can have F/U treadmill.   Normal ETT 04/09/11  ROS: Denies fever, malais, weight loss, blurry vision, decreased visual acuity, cough, sputum, SOB, hemoptysis, pleuritic pain, palpitaitons, heartburn, abdominal pain, melena, lower extremity edema, claudication, or rash.  All other systems reviewed and negative  General: Affect appropriate Healthy:  appears stated age HEENT: normal Neck supple with no adenopathy JVP normal no bruits no thyromegaly Lungs clear with no wheezing and good diaphragmatic motion Heart:  S1/S2 no murmur, no rub, gallop or click PMI normal Abdomen: benighn, BS positve, no tenderness, no AAA no bruit.  No HSM or HJR Distal pulses intact with no bruits No edema Neuro non-focal Skin warm and dry No muscular weakness   Current Outpatient Prescriptions  Medication Sig Dispense Refill  . aspirin 325 MG tablet Take 325 mg by mouth daily.        . diphenhydramine-acetaminophen (TYLENOL PM EXTRA STRENGTH) 25-500 MG TABS as needed.        . docusate sodium (COLACE) 100 MG capsule Take 100 mg by mouth as needed.        . ezetimibe-simvastatin (VYTORIN) 10-40 MG per tablet Take 1 tablet by mouth at bedtime.        . fish oil-omega-3 fatty acids 1000 MG capsule Take 1 g by mouth daily.        [name] gabapentin (NEURONTIN) 100 MG capsule 6 tabs by mouth once daily       . metoprolol succinate (TOPROL-XL) 25 MG 24 hr  tablet Take 25 mg by mouth 2 (two) times daily.        . Multiple Vitamin (MULTIVITAMIN) tablet Take 1 tablet by mouth daily.        . nitroGLYCERIN (NITROSTAT) 0.4 MG SL tablet Place 1 tablet (0.4 mg total) under the tongue every 5 (five) minutes as needed for chest pain.  25 tablet  12  . potassium chloride (MICRO-K) 10 MEQ CR capsule Take 10 mEq by mouth 2 (two) times daily.        . ranitidine (ZANTAC) 150 MG tablet take 1 tablet by mouth twice a day  60 tablet  4    Allergies  Review of patient's allergies indicates no known allergies.  Electrocardiogram: NSR rate 60 normal ECG  Assessment and Plan

## 2012-01-01 NOTE — Assessment & Plan Note (Signed)
Cholesterol is at goal.  Continue current dose of statin and diet Rx.  No myalgias or side effects.  F/U  LFT's in 6 months. No results found for this basename: LDLCALC             

## 2012-01-25 ENCOUNTER — Encounter (HOSPITAL_COMMUNITY): Payer: Self-pay | Admitting: Pharmacy Technician

## 2012-01-31 ENCOUNTER — Other Ambulatory Visit: Payer: Self-pay | Admitting: Orthopedic Surgery

## 2012-02-01 NOTE — Pre-Procedure Instructions (Addendum)
19 Ricardo Jimenez  02/01/2012   Your procedure is scheduled on:  4.22.13  Report to [facility] at 800 AM.  Call this number if you have problems the morning of surgery: [phone]   Remember:   Do not eat food:After Midnight.  May have clear liquids: up to 4 Hours before arrival. 400 am  Clear liquids include soda, tea, black coffee, apple or grape juice, broth.  Take these medicines the morning of surgery with A SIP OF WATER: gabapentin, metropolol, zantac STOP multi vit, aspirin, fish oil ,  ,gluscosamine-chondroitin now   Do not wear jewelry, make-up or nail polish.  Do not wear lotions, powders, or perfumes. You may wear deodorant.  Do not shave 48 hours prior to surgery.  Do not bring valuables to the hospital.  Contacts, dentures or bridgework may not be worn into surgery.  Leave suitcase in the car. After surgery it may be brought to your room.  For patients admitted to the hospital, checkout time is 11:00 AM the day of discharge.   Patients discharged the day of surgery will not be allowed to drive home.  Name and phone number of your driver: Lanora Manis [phone]  Special Instructions: Incentive Spirometry - Practice and bring it with you on the day of surgery. and CHG Shower Use Special Wash: 1/2 bottle night before surgery and 1/2 bottle morning of surgery.   Please read over the following fact sheets that you were given: Blood Transfusion Information, Total Joint Packet, MRSA Information and Surgical Site Infection Prevention,pain, coughing and deep breathing

## 2012-02-04 ENCOUNTER — Encounter (HOSPITAL_COMMUNITY)
Admission: RE | Admit: 2012-02-04 | Discharge: 2012-02-04 | Disposition: A | Discharge status: Home / Self Care | Payer: BC Managed Care – PPO | Source: Ambulatory Visit | Attending: Orthopedic Surgery | Admitting: Orthopedic Surgery

## 2012-02-04 ENCOUNTER — Encounter (HOSPITAL_COMMUNITY): Payer: Self-pay

## 2012-02-04 HISTORY — DX: Gastro-esophageal reflux disease without esophagitis: K21.9

## 2012-02-04 HISTORY — DX: Urinary calculus, unspecified: N20.9

## 2012-02-04 LAB — CBC
HCT: 41.8 % (ref 39.0–52.0)
Hemoglobin: 14.2 g/dL (ref 13.0–17.0)
MCV: 91.7 fL (ref 78.0–100.0)
RBC: 4.56 MIL/uL (ref 4.22–5.81)
WBC: 5 10*3/uL (ref 4.0–10.5)

## 2012-02-04 LAB — COMPREHENSIVE METABOLIC PANEL
Alkaline Phosphatase: 77 U/L (ref 39–117)
BUN: 10 mg/dL (ref 6–23)
CO2: 28 mEq/L (ref 19–32)
Chloride: 106 mEq/L (ref 96–112)
Creatinine, Ser: 0.94 mg/dL (ref 0.50–1.35)
GFR calc non Af Amer: 88 mL/min — ABNORMAL LOW (ref >90)
Glucose, Bld: 105 mg/dL — ABNORMAL HIGH (ref 70–99)
Potassium: 4.6 mEq/L (ref 3.5–5.1)
Total Bilirubin: 0.3 mg/dL (ref 0.3–1.2)

## 2012-02-04 LAB — URINALYSIS, ROUTINE W REFLEX MICROSCOPIC
Glucose, UA: NEGATIVE mg/dL
Ketones, ur: NEGATIVE mg/dL
Protein, ur: 30 mg/dL — AB
pH: 6 (ref 5.0–8.0)

## 2012-02-04 LAB — DIFFERENTIAL
Basophils Absolute: 0 10*3/uL (ref 0.0–0.1)
Basophils Relative: 1 % (ref 0–1)
Eosinophils Absolute: 0.2 10*3/uL (ref 0.0–0.7)
Neutro Abs: 2.3 10*3/uL (ref 1.7–7.7)
Neutrophils Relative %: 46 % (ref 43–77)

## 2012-02-04 LAB — URINE MICROSCOPIC-ADD ON

## 2012-02-04 LAB — PROTIME-INR: INR: 0.98 (ref 0.00–1.49)

## 2012-02-04 LAB — SURGICAL PCR SCREEN: Staphylococcus aureus: NEGATIVE

## 2012-02-04 LAB — TYPE AND SCREEN: ABO/RH(D): O NEG

## 2012-02-04 MED ORDER — CHLORHEXIDINE GLUCONATE 4 % EX LIQD: 60.0000 mL | Freq: Once | CUTANEOUS | Status: DC

## 2012-02-05 LAB — URINE CULTURE
Colony Count: NO GROWTH
Culture  Setup Time: 201304151145
Culture: NO GROWTH

## 2012-02-10 MED ORDER — ACETAMINOPHEN 10 MG/ML IV SOLN
1000.0000 mg | Freq: Four times a day (QID) | INTRAVENOUS | Status: DC
  Administered 2012-02-11: 1000 mg via INTRAVENOUS
  Filled 2012-02-10 (×4): qty 100

## 2012-02-10 MED ORDER — CEFAZOLIN SODIUM-DEXTROSE 2-3 GM-% IV SOLR
2.0000 g | INTRAVENOUS | Status: AC
  Administered 2012-02-11: 2 g via INTRAVENOUS
  Filled 2012-02-10: qty 50

## 2012-02-11 ENCOUNTER — Encounter (HOSPITAL_COMMUNITY): Payer: Self-pay | Admitting: Certified Registered Nurse Anesthetist

## 2012-02-11 ENCOUNTER — Inpatient Hospital Stay (HOSPITAL_COMMUNITY)
Admission: RE | Admit: 2012-02-11 | Discharge: 2012-02-13 | DRG: 209 | Disposition: A | Discharge status: Home Health | Payer: BC Managed Care – PPO | Source: Ambulatory Visit | Attending: Orthopedic Surgery | Admitting: Orthopedic Surgery

## 2012-02-11 ENCOUNTER — Encounter (HOSPITAL_COMMUNITY): Payer: Self-pay | Admitting: *Deleted

## 2012-02-11 ENCOUNTER — Ambulatory Visit (HOSPITAL_COMMUNITY): Payer: BC Managed Care – PPO | Admitting: Certified Registered Nurse Anesthetist

## 2012-02-11 ENCOUNTER — Encounter (HOSPITAL_COMMUNITY)
Admission: RE | Disposition: A | Discharge status: Home Health | Payer: Self-pay | Source: Ambulatory Visit | Attending: Orthopedic Surgery

## 2012-02-11 DIAGNOSIS — E7800 Pure hypercholesterolemia, unspecified: Secondary | ICD-10-CM | POA: Diagnosis present

## 2012-02-11 DIAGNOSIS — D62 Acute posthemorrhagic anemia: Secondary | ICD-10-CM | POA: Diagnosis not present

## 2012-02-11 DIAGNOSIS — Z87442 Personal history of urinary calculi: Secondary | ICD-10-CM

## 2012-02-11 DIAGNOSIS — Z01812 Encounter for preprocedural laboratory examination: Secondary | ICD-10-CM

## 2012-02-11 DIAGNOSIS — Z8711 Personal history of peptic ulcer disease: Secondary | ICD-10-CM

## 2012-02-11 DIAGNOSIS — Q638 Other specified congenital malformations of kidney: Secondary | ICD-10-CM

## 2012-02-11 DIAGNOSIS — E782 Mixed hyperlipidemia: Secondary | ICD-10-CM | POA: Diagnosis present

## 2012-02-11 DIAGNOSIS — I10 Essential (primary) hypertension: Secondary | ICD-10-CM | POA: Diagnosis present

## 2012-02-11 DIAGNOSIS — K219 Gastro-esophageal reflux disease without esophagitis: Secondary | ICD-10-CM | POA: Diagnosis present

## 2012-02-11 DIAGNOSIS — M1710 Unilateral primary osteoarthritis, unspecified knee: Principal | ICD-10-CM | POA: Diagnosis present

## 2012-02-11 DIAGNOSIS — M1712 Unilateral primary osteoarthritis, left knee: Secondary | ICD-10-CM

## 2012-02-11 DIAGNOSIS — Z79899 Other long term (current) drug therapy: Secondary | ICD-10-CM

## 2012-02-11 HISTORY — PX: TOTAL KNEE ARTHROPLASTY: SHX125

## 2012-02-11 SURGERY — ARTHROPLASTY, KNEE, TOTAL
Anesthesia: General | Site: Knee | Laterality: Left | Wound class: Clean

## 2012-02-11 MED ORDER — METHOCARBAMOL 100 MG/ML IJ SOLN
500.0000 mg | Freq: Four times a day (QID) | INTRAVENOUS | Status: DC | PRN
  Filled 2012-02-11: qty 5

## 2012-02-11 MED ORDER — CEFAZOLIN SODIUM-DEXTROSE 2-3 GM-% IV SOLR
2.0000 g | Freq: Four times a day (QID) | INTRAVENOUS | Status: AC
  Administered 2012-02-11 – 2012-02-12 (×3): 2 g via INTRAVENOUS
  Filled 2012-02-11 (×4): qty 50

## 2012-02-11 MED ORDER — ONDANSETRON HCL 4 MG PO TABS: 4.0000 mg | ORAL_TABLET | Freq: Four times a day (QID) | ORAL | Status: DC | PRN

## 2012-02-11 MED ORDER — FENTANYL CITRATE 0.05 MG/ML IJ SOLN
100.0000 ug | INTRAMUSCULAR | Status: DC | PRN
  Administered 2012-02-11: 100 ug via INTRAVENOUS

## 2012-02-11 MED ORDER — ACETAMINOPHEN 10 MG/ML IV SOLN
1000.0000 mg | Freq: Once | INTRAVENOUS | Status: DC
  Filled 2012-02-11: qty 100

## 2012-02-11 MED ORDER — ALUM & MAG HYDROXIDE-SIMETH 200-200-20 MG/5ML PO SUSP: 30.0000 mL | ORAL | Status: DC | PRN

## 2012-02-11 MED ORDER — BUPIVACAINE-EPINEPHRINE PF 0.5-1:200000 % IJ SOLN
INTRAMUSCULAR | Status: DC | PRN
  Administered 2012-02-11: 30 mL

## 2012-02-11 MED ORDER — FENTANYL CITRATE 0.05 MG/ML IJ SOLN
INTRAMUSCULAR | Status: DC | PRN
  Administered 2012-02-11 (×3): 50 ug via INTRAVENOUS

## 2012-02-11 MED ORDER — FLEET ENEMA 7-19 GM/118ML RE ENEM: 1.0000 | ENEMA | Freq: Once | RECTAL | Status: AC | PRN

## 2012-02-11 MED ORDER — ACETAMINOPHEN 10 MG/ML IV SOLN
INTRAVENOUS | Status: AC
  Filled 2012-02-11: qty 100

## 2012-02-11 MED ORDER — MIDAZOLAM HCL 2 MG/2ML IJ SOLN
2.0000 mg | INTRAMUSCULAR | Status: DC | PRN
  Administered 2012-02-11: 2 mg via INTRAVENOUS

## 2012-02-11 MED ORDER — MIDAZOLAM HCL 2 MG/2ML IJ SOLN
INTRAMUSCULAR | Status: AC
  Filled 2012-02-11: qty 2

## 2012-02-11 MED ORDER — GABAPENTIN 300 MG PO CAPS: 300.0000 mg | ORAL_CAPSULE | Freq: Two times a day (BID) | ORAL | Status: DC

## 2012-02-11 MED ORDER — ONDANSETRON HCL 4 MG/2ML IJ SOLN: 4.0000 mg | Freq: Four times a day (QID) | INTRAMUSCULAR | Status: DC | PRN

## 2012-02-11 MED ORDER — HYDROMORPHONE HCL PF 1 MG/ML IJ SOLN
0.2500 mg | INTRAMUSCULAR | Status: DC | PRN
  Administered 2012-02-11 (×2): 0.5 mg via INTRAVENOUS

## 2012-02-11 MED ORDER — METOCLOPRAMIDE HCL 5 MG/ML IJ SOLN: 5.0000 mg | Freq: Three times a day (TID) | INTRAMUSCULAR | Status: DC | PRN

## 2012-02-11 MED ORDER — BUPIVACAINE 0.25 % ON-Q PUMP SINGLE CATH 300ML
300.0000 mL | INJECTION | Status: DC
  Filled 2012-02-11: qty 300

## 2012-02-11 MED ORDER — DOCUSATE SODIUM 100 MG PO CAPS
100.0000 mg | ORAL_CAPSULE | Freq: Two times a day (BID) | ORAL | Status: DC
  Administered 2012-02-11 – 2012-02-13 (×4): 100 mg via ORAL
  Filled 2012-02-11 (×4): qty 1

## 2012-02-11 MED ORDER — PROPOFOL 10 MG/ML IV BOLUS
INTRAVENOUS | Status: DC | PRN
  Administered 2012-02-11: 200 mg via INTRAVENOUS

## 2012-02-11 MED ORDER — METOPROLOL TARTRATE 25 MG PO TABS
25.0000 mg | ORAL_TABLET | Freq: Two times a day (BID) | ORAL | Status: DC
  Administered 2012-02-11 – 2012-02-13 (×4): 25 mg via ORAL
  Filled 2012-02-11 (×5): qty 1

## 2012-02-11 MED ORDER — DIPHENHYDRAMINE HCL 12.5 MG/5ML PO ELIX: 12.5000 mg | ORAL_SOLUTION | ORAL | Status: DC | PRN

## 2012-02-11 MED ORDER — SODIUM CHLORIDE 0.9 % IV SOLN
INTRAVENOUS | Status: DC
  Administered 2012-02-11: via INTRAVENOUS

## 2012-02-11 MED ORDER — NITROGLYCERIN 0.4 MG SL SUBL: 0.4000 mg | SUBLINGUAL_TABLET | SUBLINGUAL | Status: DC | PRN

## 2012-02-11 MED ORDER — METHOCARBAMOL 500 MG PO TABS
500.0000 mg | ORAL_TABLET | Freq: Four times a day (QID) | ORAL | Status: DC | PRN
  Administered 2012-02-12 (×2): 500 mg via ORAL
  Filled 2012-02-11 (×3): qty 1

## 2012-02-11 MED ORDER — DEXAMETHASONE SODIUM PHOSPHATE 4 MG/ML IJ SOLN
INTRAMUSCULAR | Status: DC | PRN
  Administered 2012-02-11: 10 mg via INTRAVENOUS

## 2012-02-11 MED ORDER — LACTATED RINGERS IV SOLN
INTRAVENOUS | Status: DC
  Administered 2012-02-11: 10:00:00 via INTRAVENOUS

## 2012-02-11 MED ORDER — BUPIVACAINE ON-Q PAIN PUMP (FOR ORDER SET NO CHG)
INJECTION | Status: DC
  Filled 2012-02-11: qty 1

## 2012-02-11 MED ORDER — FENTANYL CITRATE 0.05 MG/ML IJ SOLN
INTRAMUSCULAR | Status: AC
  Filled 2012-02-11: qty 2

## 2012-02-11 MED ORDER — EPHEDRINE SULFATE 50 MG/ML IJ SOLN
INTRAMUSCULAR | Status: DC | PRN
  Administered 2012-02-11: 10 mg via INTRAVENOUS

## 2012-02-11 MED ORDER — HYDROMORPHONE HCL PF 1 MG/ML IJ SOLN
0.5000 mg | INTRAMUSCULAR | Status: DC | PRN
  Administered 2012-02-11 – 2012-02-12 (×4): 1 mg via INTRAVENOUS
  Filled 2012-02-11 (×4): qty 1

## 2012-02-11 MED ORDER — NEOSTIGMINE METHYLSULFATE 1 MG/ML IJ SOLN
INTRAMUSCULAR | Status: DC | PRN
  Administered 2012-02-11: 5 mg via INTRAVENOUS

## 2012-02-11 MED ORDER — BUPIVACAINE 0.25 % ON-Q PUMP SINGLE CATH 300ML
INJECTION | Status: DC | PRN
  Administered 2012-02-11: 300 mL

## 2012-02-11 MED ORDER — METOCLOPRAMIDE HCL 10 MG PO TABS: 5.0000 mg | ORAL_TABLET | Freq: Three times a day (TID) | ORAL | Status: DC | PRN

## 2012-02-11 MED ORDER — OXYCODONE HCL 5 MG PO TABS
5.0000 mg | ORAL_TABLET | ORAL | Status: DC | PRN
  Administered 2012-02-12 – 2012-02-13 (×4): 10 mg via ORAL
  Filled 2012-02-11 (×4): qty 2

## 2012-02-11 MED ORDER — OXYCODONE HCL 10 MG PO TB12
10.0000 mg | ORAL_TABLET | Freq: Two times a day (BID) | ORAL | Status: DC
  Administered 2012-02-11 – 2012-02-13 (×4): 10 mg via ORAL
  Filled 2012-02-11 (×4): qty 1

## 2012-02-11 MED ORDER — BUPIVACAINE-EPINEPHRINE 0.25% -1:200000 IJ SOLN
INTRAMUSCULAR | Status: DC | PRN
  Administered 2012-02-11: 20 mL

## 2012-02-11 MED ORDER — ACETAMINOPHEN 325 MG PO TABS: 650.0000 mg | ORAL_TABLET | Freq: Four times a day (QID) | ORAL | Status: DC | PRN

## 2012-02-11 MED ORDER — SENNOSIDES-DOCUSATE SODIUM 8.6-50 MG PO TABS: 1.0000 | ORAL_TABLET | Freq: Every evening | ORAL | Status: DC | PRN

## 2012-02-11 MED ORDER — BISACODYL 5 MG PO TBEC: 5.0000 mg | DELAYED_RELEASE_TABLET | Freq: Every day | ORAL | Status: DC | PRN

## 2012-02-11 MED ORDER — PHENOL 1.4 % MT LIQD: 1.0000 | OROMUCOSAL | Status: DC | PRN

## 2012-02-11 MED ORDER — CELECOXIB 200 MG PO CAPS
200.0000 mg | ORAL_CAPSULE | Freq: Two times a day (BID) | ORAL | Status: DC
  Administered 2012-02-11 – 2012-02-13 (×4): 200 mg via ORAL
  Filled 2012-02-11 (×5): qty 1

## 2012-02-11 MED ORDER — POTASSIUM CHLORIDE CRYS ER 10 MEQ PO TBCR
5.0000 meq | EXTENDED_RELEASE_TABLET | Freq: Two times a day (BID) | ORAL | Status: DC
  Administered 2012-02-11: 5 meq via ORAL
  Filled 2012-02-11 (×5): qty 1

## 2012-02-11 MED ORDER — GLYCOPYRROLATE 0.2 MG/ML IJ SOLN
INTRAMUSCULAR | Status: DC | PRN
  Administered 2012-02-11: .9 mg via INTRAVENOUS

## 2012-02-11 MED ORDER — GABAPENTIN 300 MG PO CAPS
600.0000 mg | ORAL_CAPSULE | Freq: Every day | ORAL | Status: DC
  Administered 2012-02-11 – 2012-02-12 (×2): 600 mg via ORAL
  Filled 2012-02-11 (×3): qty 2

## 2012-02-11 MED ORDER — MENTHOL 3 MG MT LOZG
1.0000 | LOZENGE | OROMUCOSAL | Status: DC | PRN
  Filled 2012-02-11: qty 9

## 2012-02-11 MED ORDER — ROCURONIUM BROMIDE 100 MG/10ML IV SOLN
INTRAVENOUS | Status: DC | PRN
  Administered 2012-02-11: 50 mg via INTRAVENOUS

## 2012-02-11 MED ORDER — SODIUM CHLORIDE 0.9 % IV SOLN: INTRAVENOUS | Status: DC

## 2012-02-11 MED ORDER — SODIUM CHLORIDE 0.9 % IR SOLN
Status: DC | PRN
  Administered 2012-02-11: 4000 mL

## 2012-02-11 MED ORDER — GABAPENTIN 300 MG PO CAPS
300.0000 mg | ORAL_CAPSULE | Freq: Every day | ORAL | Status: DC
  Administered 2012-02-12 – 2012-02-13 (×2): 300 mg via ORAL
  Filled 2012-02-11 (×2): qty 1

## 2012-02-11 MED ORDER — FAMOTIDINE 20 MG PO TABS
20.0000 mg | ORAL_TABLET | Freq: Every day | ORAL | Status: DC
  Administered 2012-02-12 – 2012-02-13 (×2): 20 mg via ORAL
  Filled 2012-02-11 (×2): qty 1

## 2012-02-11 MED ORDER — EZETIMIBE-SIMVASTATIN 10-40 MG PO TABS
1.0000 | ORAL_TABLET | Freq: Every day | ORAL | Status: DC
  Administered 2012-02-11 – 2012-02-12 (×2): 1 via ORAL
  Filled 2012-02-11 (×3): qty 1

## 2012-02-11 MED ORDER — ZOLPIDEM TARTRATE 5 MG PO TABS
5.0000 mg | ORAL_TABLET | Freq: Every evening | ORAL | Status: DC | PRN
  Administered 2012-02-11 – 2012-02-13 (×2): 5 mg via ORAL
  Filled 2012-02-11 (×2): qty 1

## 2012-02-11 MED ORDER — POTASSIUM CITRATE ER 5 MEQ (540 MG) PO TBCR: 5.0000 meq | EXTENDED_RELEASE_TABLET | Freq: Two times a day (BID) | ORAL | Status: DC

## 2012-02-11 MED ORDER — ENOXAPARIN SODIUM 40 MG/0.4ML ~~LOC~~ SOLN
40.0000 mg | SUBCUTANEOUS | Status: DC
  Administered 2012-02-12 – 2012-02-13 (×2): 40 mg via SUBCUTANEOUS
  Filled 2012-02-11 (×3): qty 0.4

## 2012-02-11 MED ORDER — LACTATED RINGERS IV SOLN
INTRAVENOUS | Status: DC | PRN
  Administered 2012-02-11 (×2): via INTRAVENOUS

## 2012-02-11 MED ORDER — ACETAMINOPHEN 650 MG RE SUPP: 650.0000 mg | Freq: Four times a day (QID) | RECTAL | Status: DC | PRN

## 2012-02-11 MED ORDER — ONDANSETRON HCL 4 MG/2ML IJ SOLN
INTRAMUSCULAR | Status: DC | PRN
  Administered 2012-02-11 (×2): 4 mg via INTRAVENOUS

## 2012-02-11 SURGICAL SUPPLY — 60 items
BANDAGE ESMARK 6X9 LF (GAUZE/BANDAGES/DRESSINGS) ×1 IMPLANT
BLADE SAGITTAL 13X1.27X60 (BLADE) ×2 IMPLANT
BLADE SAW SGTL 83.5X18.5 (BLADE) ×2 IMPLANT
BNDG ESMARK 6X9 LF (GAUZE/BANDAGES/DRESSINGS) ×2
BOWL SMART MIX CTS (DISPOSABLE) ×2 IMPLANT
CATH KIT ON Q 5IN SLV (PAIN MANAGEMENT) ×2 IMPLANT
CEMENT BONE SIMPLEX SPEEDSET (Cement) ×4 IMPLANT
CLOTH BEACON ORANGE TIMEOUT ST (SAFETY) ×2 IMPLANT
COVER BACK TABLE 24X17X13 BIG (DRAPES) ×2 IMPLANT
COVER SURGICAL LIGHT HANDLE (MISCELLANEOUS) ×4 IMPLANT
CUFF TOURNIQUET SINGLE 34IN LL (TOURNIQUET CUFF) ×2 IMPLANT
DRAPE EXTREMITY T 121X128X90 (DRAPE) ×2 IMPLANT
DRAPE INCISE IOBAN 66X45 STRL (DRAPES) ×4 IMPLANT
DRAPE PROXIMA HALF (DRAPES) ×2 IMPLANT
DRAPE U-SHAPE 47X51 STRL (DRAPES) ×2 IMPLANT
DRSG ADAPTIC 3X8 NADH LF (GAUZE/BANDAGES/DRESSINGS) IMPLANT
DRSG PAD ABDOMINAL 8X10 ST (GAUZE/BANDAGES/DRESSINGS) IMPLANT
DURAPREP 26ML APPLICATOR (WOUND CARE) ×4 IMPLANT
ELECT REM PT RETURN 9FT ADLT (ELECTROSURGICAL) ×2
ELECTRODE REM PT RTRN 9FT ADLT (ELECTROSURGICAL) ×1 IMPLANT
EVACUATOR 1/8 PVC DRAIN (DRAIN) ×2 IMPLANT
GLOVE BIOGEL M 7.0 STRL (GLOVE) IMPLANT
GLOVE BIOGEL PI IND STRL 7.5 (GLOVE) IMPLANT
GLOVE BIOGEL PI IND STRL 8 (GLOVE) ×1 IMPLANT
GLOVE BIOGEL PI IND STRL 8.5 (GLOVE) ×2 IMPLANT
GLOVE BIOGEL PI INDICATOR 7.5 (GLOVE)
GLOVE BIOGEL PI INDICATOR 8 (GLOVE) ×1
GLOVE BIOGEL PI INDICATOR 8.5 (GLOVE) ×2
GLOVE SURG ORTHO 8.0 STRL STRW (GLOVE) ×6 IMPLANT
GLOVE SURG SS PI 7.5 STRL IVOR (GLOVE) ×2 IMPLANT
GOWN PREVENTION PLUS XLARGE (GOWN DISPOSABLE) ×4 IMPLANT
GOWN STRL NON-REIN LRG LVL3 (GOWN DISPOSABLE) IMPLANT
GOWN STRL REIN XL XLG (GOWN DISPOSABLE) ×2 IMPLANT
HANDPIECE INTERPULSE COAX TIP (DISPOSABLE) ×1
HOOD PEEL AWAY FACE SHEILD DIS (HOOD) ×6 IMPLANT
KIT BASIN OR (CUSTOM PROCEDURE TRAY) ×2 IMPLANT
KIT ROOM TURNOVER OR (KITS) ×2 IMPLANT
MANIFOLD NEPTUNE II (INSTRUMENTS) ×2 IMPLANT
NEEDLE 22X1 1/2 (OR ONLY) (NEEDLE) ×2 IMPLANT
NS IRRIG 1000ML POUR BTL (IV SOLUTION) ×2 IMPLANT
PACK TOTAL JOINT (CUSTOM PROCEDURE TRAY) ×2 IMPLANT
PAD ARMBOARD 7.5X6 YLW CONV (MISCELLANEOUS) ×4 IMPLANT
PADDING CAST COTTON 6X4 STRL (CAST SUPPLIES) IMPLANT
POSITIONER HEAD PRONE TRACH (MISCELLANEOUS) ×2 IMPLANT
SET HNDPC FAN SPRY TIP SCT (DISPOSABLE) ×1 IMPLANT
SPONGE GAUZE 4X4 12PLY (GAUZE/BANDAGES/DRESSINGS) IMPLANT
STAPLER VISISTAT 35W (STAPLE) ×2 IMPLANT
SUCTION FRAZIER TIP 10 FR DISP (SUCTIONS) ×2 IMPLANT
SUT BONE WAX W31G (SUTURE) ×2 IMPLANT
SUT VIC AB 0 CTB1 27 (SUTURE) ×4 IMPLANT
SUT VIC AB 1 CT1 27 (SUTURE) ×1
SUT VIC AB 1 CT1 27XBRD ANBCTR (SUTURE) ×1 IMPLANT
SUT VIC AB 2-0 CT1 27 (SUTURE) ×2
SUT VIC AB 2-0 CT1 TAPERPNT 27 (SUTURE) ×2 IMPLANT
SUT VLOC 180 0 24IN GS25 (SUTURE) ×2 IMPLANT
SYR CONTROL 10ML LL (SYRINGE) ×2 IMPLANT
TOWEL OR 17X24 6PK STRL BLUE (TOWEL DISPOSABLE) ×2 IMPLANT
TOWEL OR 17X26 10 PK STRL BLUE (TOWEL DISPOSABLE) ×2 IMPLANT
TRAY FOLEY CATH 14FR (SET/KITS/TRAYS/PACK) IMPLANT
WATER STERILE IRR 1000ML POUR (IV SOLUTION) ×4 IMPLANT

## 2012-02-11 NOTE — Progress Notes (Signed)
Orthopedic Tech Progress Note Patient Details:  Ricardo Jimenez 07-19-1951 [id number]  CPM Left Knee CPM Left Knee: On Left Knee Flexion (Degrees): 90  Left Knee Extension (Degrees): 0  Patient in at 1215  [name], Marcelle Smiling T 02/11/2012, 1:16 PM

## 2012-02-11 NOTE — Progress Notes (Signed)
Orthopedic Tech Progress Note Patient Details:  Ricardo Jimenez [birth year] [id number]    Trapeze bar patient helper  [name] 02/11/2012, 4:07 PM

## 2012-02-11 NOTE — Anesthesia Procedure Notes (Addendum)
Anesthesia Regional Block:  Femoral nerve block  Pre-Anesthetic Checklist: ,, timeout performed, Correct Patient, Correct Site, Correct Laterality, Correct Procedure, Correct Position, site marked, Risks and benefits discussed, pre-op evaluation,  At surgeon's request and post-op pain management  Laterality: Left  Prep: Maximum Sterile Barrier Precautions used and chloraprep       Needles:  Injection technique: Single-shot  Needle Type: Other   (Arrow 50mm)    Needle Gauge: 22 and 22 G    Additional Needles:  Procedures: nerve stimulator Femoral nerve block  Nerve Stimulator or Paresthesia:  Response: Patellar respose, 0.4 mA,   Additional Responses:   Narrative:  Start time: 02/11/2012 9:35 AM End time: 02/11/2012 9:45 AM Injection made incrementally with aspirations every 5 mL. Anesthesiologist: Fitzgerald,MD  Additional Notes: 2% Lidocaine skin wheel.   Femoral nerve block Procedure Name: LMA Insertion Date/Time: 02/11/2012 10:09 AM Performed by: Margaree Mackintosh Pre-anesthesia Checklist: Patient identified, Timeout performed, Emergency Drugs available, Suction available and Patient being monitored Patient Re-evaluated:Patient Re-evaluated prior to inductionOxygen Delivery Method: Circle system utilized Preoxygenation: Pre-oxygenation with 100% oxygen Intubation Type: IV induction LMA: LMA inserted LMA Size: 4.0 Number of attempts: 1 Placement Confirmation: positive ETCO2 and breath sounds checked- equal and bilateral Tube secured with: Tape Dental Injury: Teeth and Oropharynx as per pre-operative assessment     Procedure Name: Intubation Date/Time: 02/11/2012 10:23 AM Performed by: Margaree Mackintosh Pre-anesthesia Checklist: Patient identified, Timeout performed, Emergency Drugs available, Suction available and Patient being monitored Patient Re-evaluated:Patient Re-evaluated prior to inductionOxygen Delivery Method: Circle system utilized Preoxygenation:  Pre-oxygenation with 100% oxygen Intubation Type: Combination inhalational/ intravenous induction Ventilation: Oral airway inserted - appropriate to patient size and Two handed mask ventilation required Laryngoscope Size: Mac, 3, Miller and 2 Grade View: Grade IV Tube type: Oral Tube size: 7.5 mm Number of attempts: 3 Airway Equipment and Method: Stylet and Video-laryngoscopy Placement Confirmation: ETT inserted through vocal cords under direct vision,  positive ETCO2 and breath sounds checked- equal and bilateral Secured at: 21 cm Tube secured with: Tape Dental Injury: Teeth and Oropharynx as per pre-operative assessment  Difficulty Due To: Difficult Airway- due to anterior larynx and Difficult Airway- due to reduced neck mobility Comments: Glidescope utilized

## 2012-02-11 NOTE — Progress Notes (Signed)
Orthopedic Tech Progress Note Patient Details:  Ricardo Jimenez 16-Jul-1951 [id number] Viewed order from doctor's order list Patient ID: Ricardo Jimenez, male   DOB: 04/11/1951, 61 y.o.   MRN: [id number]   [name] 02/11/2012, 4:07 PM

## 2012-02-11 NOTE — H&P (Signed)
Ricardo Jimenez MRN:  [id number] DOB/SEX:  1950-12-16/male  CHIEF COMPLAINT:  Painful left Knee  HISTORY: Patient is a 61 y.o. male presented with a history of pain in the left knee. Onset of symptoms was gradual starting several years ago with gradually worsening course since that time. The patient noted no past surgery on the left knee. Prior procedures on the knee include arthroscopy. Patient has been treated conservatively with over-the-counter NSAIDs and activity modification. Patient currently rates pain in the knee at 10 out of 10 with activity. There is pain at night.  PAST MEDICAL HISTORY: Patient Active Problem List  Diagnoses Date Noted  . Preop cardiovascular exam 01/01/2012  . MIXED HYPERLIPIDEMIA 07/06/2009  . ESSENTIAL HYPERTENSION, BENIGN 07/06/2009  . CHEST PAIN 07/06/2009   Past Medical History  Diagnosis Date  . Arthritis   . HTN (hypertension)   . Hypercholesterolemia   . PUD (peptic ulcer disease)   . 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 Kidney anomaly, congenital     horseshoe kidney  . GERD (gastroesophageal reflux disease)    Past Surgical History  Procedure Date  . Other surgical history     BIL.CARPAL TUNNEL SURG.  . Rt.knee replacement   . Cardiac catheterization CABG  . Kidney stone surgery   . Carpal tunnel release     bil  . Knee arthroscopy     rt x2, lft x3  . Coronary artery bypass graft 2010  . Hernia repair 2011    rt ing     MEDICATIONS:   No prescriptions prior to admission    ALLERGIES:  No Known Allergies  REVIEW OF SYSTEMS:  Pertinent items are noted in HPI.   FAMILY HISTORY:   Family History  Problem Relation Age of Onset  . COPD Father     AGE 67    SOCIAL HISTORY:   History  Substance Use Topics  . Smoking status: Never Smoker   . Smokeless tobacco: Not on file   Comment: occ beer  . Alcohol Use: Yes     EXAMINATION:  Vital signs in last 24 hours:    General appearance: alert, cooperative and no  distress Lungs: clear to auscultation bilaterally Heart: regular rate and rhythm, S1, S2 normal, no murmur, click, rub or gallop Abdomen: soft, non-tender; bowel sounds normal; no masses,  no organomegaly Extremities: extremities normal, atraumatic, no cyanosis or edema and Homans sign is negative, no sign of DVT Pulses: 2+ and symmetric Skin: Skin color, texture, turgor normal. No rashes or lesions Neurologic: Alert and oriented X 3, normal strength and tone. Normal symmetric reflexes. Normal coordination and gait  Musculoskeletal:  ROM 0-110, Ligaments intact,  Imaging Review Plain radiographs demonstrate severe degenerative joint disease of the left knee. The overall alignment is mild valgus. The bone quality appears to be good for age and reported activity level.  Assessment/Plan: End stage arthritis, left knee   The patient history, physical examination and imaging studies are consistent with advanced degenerative joint disease of the left knee. The patient has failed conservative treatment.  The clearance notes were reviewed.  After discussion with the patient it was felt that Total Knee Replacement was indicated. The procedure,  risks, and benefits of total knee arthroplasty were presented and reviewed. The risks including but not limited to aseptic loosening, infection, blood clots, vascular injury, stiffness, patella tracking problems complications among others were discussed. The patient acknowledged the explanation, agreed to proceed with the plan.  [name] 02/11/2012, 7:16 AM

## 2012-02-11 NOTE — Anesthesia Preprocedure Evaluation (Signed)
Anesthesia Evaluation  Patient identified by MRN, date of birth, ID band Patient awake    Reviewed: Allergy & Precautions, H&P , NPO status , Patient's Chart, lab work & pertinent test results, reviewed documented beta blocker date and time   Airway Mallampati: II TM Distance: >3 FB Neck ROM: Full    Dental No notable dental hx. (+) Teeth Intact, Dental Advisory Given and Chipped   Pulmonary neg pulmonary ROS,  breath sounds clear to auscultation  Pulmonary exam normal       Cardiovascular hypertension, On Medications and On Home Beta Blockers + CAD and + CABG Rhythm:Regular Rate:Normal     Neuro/Psych negative neurological ROS  negative psych ROS   GI/Hepatic Neg liver ROS, PUD, GERD-  Medicated and Controlled,  Endo/Other  negative endocrine ROS  Renal/GU negative Renal ROS  negative genitourinary   Musculoskeletal   Abdominal   Peds  Hematology negative hematology ROS (+)   Anesthesia Other Findings   Reproductive/Obstetrics negative OB ROS                           Anesthesia Physical Anesthesia Plan  ASA: III  Anesthesia Plan: General   Post-op Pain Management:    Induction: Intravenous  Airway Management Planned: LMA  Additional Equipment:   Intra-op Plan:   Post-operative Plan: Extubation in OR  Informed Consent: I have reviewed the patients History and Physical, chart, labs and discussed the procedure including the risks, benefits and alternatives for the proposed anesthesia with the patient or authorized representative who has indicated his/her understanding and acceptance.   Dental advisory given  Plan Discussed with: CRNA  Anesthesia Plan Comments:         Anesthesia Quick Evaluation

## 2012-02-11 NOTE — Transfer of Care (Signed)
Immediate Anesthesia Transfer of Care Note  Patient: Ricardo Jimenez  Procedure(s) Performed: Procedure(s) (LRB): TOTAL KNEE ARTHROPLASTY (Left)  Patient Location: PACU  Anesthesia Type: GA combined with regional for post-op pain  Level of Consciousness: awake, alert  and oriented  Airway & Oxygen Therapy: Patient Spontanous Breathing and Patient connected to nasal cannula oxygen  Post-op Assessment: Report given to PACU RN and Post -op Vital signs reviewed and stable  Post vital signs: Reviewed and stable  Complications: No apparent anesthesia complications

## 2012-02-11 NOTE — Preoperative (Signed)
Beta Blockers   Reason not to administer Beta Blockers:Not Applicable, took metoprolol this am 

## 2012-02-11 NOTE — Plan of Care (Signed)
Problem: Consults Goal: Diagnosis- Total Joint Replacement Primary Total Knee     

## 2012-02-11 NOTE — Anesthesia Postprocedure Evaluation (Signed)
Anesthesia Post Note  Patient: Ricardo Jimenez  Procedure(s) Performed: Procedure(s) (LRB): TOTAL KNEE ARTHROPLASTY (Left)  Anesthesia type: General  Patient location: PACU  Post pain: Pain level controlled and Adequate analgesia  Post assessment: Post-op Vital signs reviewed, Patient's Cardiovascular Status Stable, Respiratory Function Stable, Patent Airway and Pain level controlled  Last Vitals:  Filed Vitals:   02/11/12 1212  BP:   Pulse: 74  Temp:   Resp: 27    Post vital signs: Reviewed and stable  Level of consciousness: awake, alert  and oriented  Complications: No apparent anesthesia complications

## 2012-02-12 ENCOUNTER — Encounter (HOSPITAL_COMMUNITY): Payer: Self-pay | Admitting: Orthopedic Surgery

## 2012-02-12 LAB — CBC
MCH: 31.3 pg (ref 26.0–34.0)
MCV: 90.6 fL (ref 78.0–100.0)
Platelets: 194 10*3/uL (ref 150–400)
RBC: 3.93 MIL/uL — ABNORMAL LOW (ref 4.22–5.81)

## 2012-02-12 LAB — BASIC METABOLIC PANEL
BUN: 16 mg/dL (ref 6–23)
CO2: 26 mEq/L (ref 19–32)
Calcium: 8.8 mg/dL (ref 8.4–10.5)
Creatinine, Ser: 0.97 mg/dL (ref 0.50–1.35)
Glucose, Bld: 151 mg/dL — ABNORMAL HIGH (ref 70–99)

## 2012-02-12 NOTE — Progress Notes (Signed)
Physical Therapy Treatment Patient Details Name: Ricardo Jimenez MRN: [id number] DOB: 15-May-1951 Today's Date: 02/12/2012 Time: 4098-1191 PT Time Calculation (min): 22 min  PT Assessment / Plan / Recommendation Comments on Treatment Session  Patient making good progress with ambulation distance and tolerance to movement.  Will need supervision for safety at home with LOB posterior.    Follow Up Recommendations  Home health PT    Equipment Recommendations  None recommended by PT    Frequency 7X/week   Plan Discharge plan remains appropriate    Precautions / Restrictions Precautions Precautions: Knee Restrictions Weight Bearing Restrictions:  (WBAT)   Pertinent Vitals/Pain 4/10 left knee with ambulation    Mobility  Bed Mobility Bed Mobility: Supine to Sit Supine to Sit: 5: Supervision Sitting - Scoot to Edge of Bed: 5: Supervision Sit to Supine: 5: Supervision Details for Bed Mobility Assistance: cues for crossed leg technique though not needed Transfers Sit to Stand: 4: Min guard;With upper extremity assist;From bed Stand to Sit: 4: Min guard;Without upper extremity assist Details for Transfer Assistance: for safety, initially LOB posterior upon standing with self recovery legs against bed Ambulation/Gait Ambulation/Gait Assistance: 4: Min guard;5: Supervision Ambulation Distance (Feet): 300 Feet Assistive device: Rolling walker Gait Pattern: Step-through pattern;Decreased step length - left    Exercises Total Joint Exercises Heel Slides: AROM;Left;15 reps;Seated (sliding foot on floor)   PT Goals Acute Rehab PT Goals Pt will go Supine/Side to Sit: with modified independence PT Goal: Supine/Side to Sit - Progress: Progressing toward goal Pt will go Sit to Supine/Side: with modified independence PT Goal: Sit to Supine/Side - Progress: Progressing toward goal Pt will go Sit to Stand: with modified independence PT Goal: Sit to Stand - Progress: Progressing toward  goal Pt will Ambulate: >150 feet;with least restrictive assistive device;with modified independence PT Goal: Ambulate - Progress: Progressing toward goal  Visit Information  Last PT Received On: 02/12/12    Subjective Data  Subjective: Just got settled for nap   Cognition  Overall Cognitive Status: Appears within functional limits for tasks assessed/performed Arousal/Alertness: Awake/alert Orientation Level: Appears intact for tasks assessed Behavior During Session: [facility] for tasks performed    Balance     End of Session PT - End of Session Equipment Utilized During Treatment: Gait belt Activity Tolerance: Patient tolerated treatment well Patient left: in bed;with call bell/phone within reach CPM Left Knee Left Knee Flexion (Degrees): 75  Left Knee Extension (Degrees): -2     Adam Sanjuan,CYNDI 02/12/2012, 3:28 PM

## 2012-02-12 NOTE — Progress Notes (Signed)
Georgena Spurling, MD   Altamese Cabal, PA-C [address] [place], [place], [place]  [address]                             [phone]   PROGRESS NOTE  Subjective:  negative for Chest Pain  negative for Shortness of Breath  negative for Nausea/Vomiting   negative for Calf Pain  negative for Bowel Movement   Tolerating Diet: yes         Patient reports pain as 3 on 0-10 scale.    Objective: Vital signs in last 24 hours:   Patient Vitals for the past 24 hrs:  BP Temp Pulse Resp SpO2 Height Weight  02/12/12 0628 145/77 mmHg 97.2 F (36.2 C) 81  18  98 % - -  02/12/12 0142 137/73 mmHg 97.7 F (36.5 C) 93  18  95 % - -  02/11/12 2200 135/77 mmHg 97.2 F (36.2 C) 100  16  95 % - -  02/11/12 1551 - - - - - 5\' 8"  (1.727 m) 98.431 kg (217 lb)  02/11/12 1330 - 97.3 F (36.3 C) - - - - -  02/11/12 1315 - - 63  7  100 % - -  02/11/12 1312 128/82 mmHg - - - - - -  02/11/12 1308 - - 63  6  100 % - -  02/11/12 1300 132/73 mmHg - 62  13  100 % - -  02/11/12 1255 - - 64  12  100 % - -  02/11/12 1245 - - 65  10  100 % - -  02/11/12 1242 158/72 mmHg - - - - - -  02/11/12 1230 - - 71  15  100 % - -  02/11/12 1227 155/76 mmHg - - - - - -  02/11/12 1215 - - 71  13  99 % - -  02/11/12 1212 144/84 mmHg - 74  27  99 % - -  02/11/12 1209 - 97.8 F (36.6 C) - - - - -  02/11/12 0951 - - 61  12  100 % - -  02/11/12 0950 - - 61  12  98 % - -  02/11/12 0949 - - 60  10  99 % - -  02/11/12 0948 - - 61  15  99 % - -  02/11/12 0947 - - 60  12  99 % - -  02/11/12 0946 - - 62  13  95 % - -  02/11/12 0945 - - 61  15  97 % - -  02/11/12 0944 - - 64  17  95 % - -  02/11/12 0943 - - 63  20  98 % - -  02/11/12 0942 - - 63  17  99 % - -  02/11/12 0941 - - 63  21  100 % - -  02/11/12 0940 - - 62  19  100 % - -  02/11/12 0939 - - 62  20  100 % - -  02/11/12 0938 - - 61  19  100 % - -    @flow {1959:LAST@   Intake/Output from previous day:   04/22 0701 - 04/23 0700 In: 1952.5 [P.O.:240;  I.V.:1712.5] Out: 2125 [Urine:1300; Drains:800]   Intake/Output this shift:   04/23 0701 - 04/23 1900 In: -  Out: 600 [Urine:600]   Intake/Output      04/22 0701 - 04/23 0700 04/23 0701 -  04/24 0700   P.O. 240    I.V. (mL/kg) 1712.5 (17.4)    Total Intake(mL/kg) 1952.5 (19.8)    Urine (mL/kg/hr) 1300 (0.6) 600   Drains 800    Blood 25    Total Output 2125 600   Net -172.5 -600           LABORATORY DATA:  Basename 02/12/12 0518  WBC 16.5*  HGB 12.3*  HCT 35.6*  PLT 194    Basename 02/12/12 0518  NA 136  K 5.4*  CL 101  CO2 26  BUN 16  CREATININE 0.97  GLUCOSE 151*  CALCIUM 8.8   Lab Results  Component Value Date   INR 0.98 02/04/2012   INR 1.3 07/22/2009   INR 1.0 07/22/2009    Examination:  General appearance: alert, cooperative and no distress Extremities: Homans sign is negative, no sign of DVT  Wound Exam: clean, dry, intact   Drainage:  Scant/small amount Serosanguinous exudate  Motor Exam: EHL and FHL Intact  Sensory Exam: Deep Peroneal normal  Vascular Exam:    Assessment:    1 Day Post-Op  Procedure(s) (LRB): TOTAL KNEE ARTHROPLASTY (Left)  ADDITIONAL DIAGNOSIS:  Active Problems:  * No active hospital problems. *   Acute Blood Loss Anemia   Plan: Physical Therapy as ordered Weight Bearing as Tolerated (WBAT)  DVT Prophylaxis:  Lovenox  DISCHARGE PLAN: Home  DISCHARGE NEEDS: HHPT, CPM, Walker and 3-in-1 comode seat         Macai Sisneros 02/12/2012, 8:15 AM

## 2012-02-12 NOTE — Progress Notes (Signed)
Utilization review completed. Lavaughn Haberle, RN, BSN.  02/12/12  

## 2012-02-12 NOTE — Progress Notes (Signed)
OT Screen  OT order received. Chart reviewed. Pt has all DME.Pt at overall S  - mod I with ADL. No OT needs at this times.Ot signing off. [facility], OTR/L  [phone] 02/12/2012

## 2012-02-12 NOTE — Evaluation (Signed)
Physical Therapy Evaluation Patient Details Name: Ricardo Jimenez MRN: [id number] DOB: 15-Dec-1950 Today's Date: 02/12/2012 Time: 4098-1191 PT Time Calculation (min): 30 min  PT Assessment / Plan / Recommendation Clinical Impression  Patient s/p left TKA presents with decreased mobility due to pain and AROM/strength deficits left LE.  He will benefit from skilled PT in the acute setting to maximize independence and safety for d/c home with wife assist.    PT Assessment  Patient needs continued PT services    Follow Up Recommendations  Home health PT    Equipment Recommendations  None recommended by PT    Frequency 7X/week    Precautions / Restrictions Precautions Precautions: Knee Precaution Booklet Issued: No Restrictions Weight Bearing Restrictions: No (WBAT)   Pertinent Vitals/Pain 3/10 left knee with ambulation      Mobility  Bed Mobility Bed Mobility: Supine to Sit;Sitting - Scoot to Edge of Bed Supine to Sit: 4: Min assist Sitting - Scoot to Edge of Bed: 5: Supervision Details for Bed Mobility Assistance: assist for left LE Transfers Transfers: Stand to Sit;Sit to Stand Sit to Stand: 4: Min guard;With upper extremity assist;From bed Stand to Sit: 4: Min guard;With armrests;To chair/3-in-1 Details for Transfer Assistance: cues for hand placement with transfers Ambulation/Gait Ambulation/Gait Assistance: 4: Min guard Ambulation Distance (Feet): 200 Feet Assistive device: Rolling walker Ambulation/Gait Assistance Details: able to perform step through sequence, though little increased pain with stepping past left foot with right    Exercises Total Joint Exercises Ankle Circles/Pumps: AROM;Both;10 reps;Supine Quad Sets: AROM;Left;10 reps;Supine Heel Slides: AAROM;AROM;Right;Left;10 reps;Supine Hip ABduction/ADduction: AAROM;Left;10 reps;Supine   PT Goals Acute Rehab PT Goals PT Goal Formulation: With patient Time For Goal Achievement: 02/16/12 Potential to  Achieve Goals: Good Pt will go Supine/Side to Sit: with modified independence PT Goal: Supine/Side to Sit - Progress: Goal set today Pt will go Sit to Supine/Side: with modified independence PT Goal: Sit to Supine/Side - Progress: Goal set today Pt will go Sit to Stand: with modified independence PT Goal: Sit to Stand - Progress: Goal set today Pt will Ambulate: >150 feet;with least restrictive assistive device;with modified independence PT Goal: Ambulate - Progress: Goal set today Pt will Go Up / Down Stairs: 1-2 stairs;with supervision;with rolling walker PT Goal: Up/Down Stairs - Progress: Goal set today Pt will Perform Home Exercise Program: with supervision, verbal cues required/provided PT Goal: Perform Home Exercise Program - Progress: Goal set today  Visit Information  Last PT Received On: 02/12/12 Assistance Needed: +1    Subjective Data  Subjective: Who will set up my home health Patient Stated Goal: To return home   Prior Functioning  Home Living Lives With: Spouse Type of Home: House Home Access: Stairs to enter Entrance Stairs-Number of Steps: 1 Home Layout: One level Bathroom Shower/Tub: Engineer, manufacturing systems: Standard Home Adaptive Equipment: Bedside commode/3-in-1;Shower chair with back;Walker - rolling;Straight cane Prior Function Level of Independence: Independent Driving: Yes Vocation: Full time employment Comments: Hospital doctor for [employer] Communication: No difficulties    Cognition  Overall Cognitive Status: Appears within functional limits for tasks assessed/performed Arousal/Alertness: Awake/alert Orientation Level: Appears intact for tasks assessed Behavior During Session: [facility] for tasks performed    Extremity/Trunk Assessment Right Lower Extremity Assessment RLE ROM/Strength/Tone: Within functional levels RLE Sensation: WFL - Light Touch Left Lower Extremity Assessment LLE ROM/Strength/Tone: Deficits;Due to pain LLE  ROM/Strength/Tone Deficits: limited knee flexion approx 30* AAROM, knee extension -10* from neutral, ankle WFL LLE Sensation: WFL - Light Touch  Balance    End of Session PT - End of Session Equipment Utilized During Treatment: Gait belt Activity Tolerance: Patient tolerated treatment well Patient left: in chair;with call bell/phone within reach   [facility] 02/12/2012, 11:27 AM

## 2012-02-13 LAB — CBC
MCH: 32.2 pg (ref 26.0–34.0)
MCHC: 34.6 g/dL (ref 30.0–36.0)
MCV: 93 fL (ref 78.0–100.0)
Platelets: 143 10*3/uL — ABNORMAL LOW (ref 150–400)
RDW: 13.2 % (ref 11.5–15.5)
WBC: 9.5 10*3/uL (ref 4.0–10.5)

## 2012-02-13 LAB — BASIC METABOLIC PANEL
Calcium: 8.8 mg/dL (ref 8.4–10.5)
Creatinine, Ser: 1.06 mg/dL (ref 0.50–1.35)
GFR calc Af Amer: 86 mL/min — ABNORMAL LOW (ref >90)
GFR calc non Af Amer: 74 mL/min — ABNORMAL LOW (ref >90)

## 2012-02-13 MED ORDER — OXYCODONE HCL 10 MG PO TB12: 10.0000 mg | ORAL_TABLET | Freq: Two times a day (BID) | ORAL | Status: DC

## 2012-02-13 MED ORDER — ENOXAPARIN SODIUM 40 MG/0.4ML ~~LOC~~ SOLN: 40.0000 mg | SUBCUTANEOUS | Status: DC

## 2012-02-13 MED ORDER — POTASSIUM CHLORIDE 20 MEQ/15ML (10%) PO LIQD
5.0000 meq | Freq: Two times a day (BID) | ORAL | Status: DC
  Filled 2012-02-13 (×2): qty 7.5

## 2012-02-13 MED ORDER — OXYCODONE HCL 5 MG PO TABS: 5.0000 mg | ORAL_TABLET | ORAL | Status: AC | PRN

## 2012-02-13 MED ORDER — METHOCARBAMOL 500 MG PO TABS: 500.0000 mg | ORAL_TABLET | Freq: Four times a day (QID) | ORAL | Status: AC | PRN

## 2012-02-13 NOTE — Discharge Instructions (Signed)
Home Health to be provided by [facility] [phone]  Diet: As you were doing prior to hospitalization   Activity:  Increase activity slowly as tolerated                  No lifting or driving for 6 weeks  Shower:  May shower without a dressing once there is no drainage from your wound.                 Do NOT wash over the wound.  If drainage remains, cover wound with saran                  Wrap and then shower.  Clean incision with betadine and change dressing                        After saran wrap removed.  Dressing:  You may change your dressing on Thursday                    Then change the dressing daily with sterile 4"x4"s gauze dressing                     And TED hose for knees.  Use paper tape to hold dressing in place                     For hips.  You may clean the incision with alcohol prior to redressing.  Weight Bearing:  Weight bearing as tolerated as taught in physical therapy.  Use a                                walker or Crutches as instructed.  To prevent constipation: you may use a stool softener such as -               Colace ( over the counter) 100 mg by mouth twice a day                Drink plenty of fluids ( prune juice may be helpful) and high fiber foods                Miralax ( over the counter) for constipation as needed.    Precautions:  If you experience chest pain or shortness of breath - call 911 immediately               For transfer to the hospital emergency department!!               If you develop a fever greater that 101 F, purulent drainage from wound,                             increased redness or drainage from wound, or calf pain -- Call the office at                                                 [phone].  Follow- Up Appointment:  Please call for an appointment to be seen on 02/26/12  [place] - [phone]                   

## 2012-02-13 NOTE — Progress Notes (Signed)
PT Progress Note:     02/13/12 1200  PT Visit Information  Last PT Received On 02/13/12  Assistance Needed +1  PT Time Calculation  PT Start Time 0845  PT Stop Time 0914  PT Time Calculation (min) 29 min  Precautions  Precautions Knee  Cognition  Overall Cognitive Status Appears within functional limits for tasks assessed/performed  Arousal/Alertness Awake/alert  Orientation Level Appears intact for tasks assessed  Behavior During Session [facility] for tasks performed  Bed Mobility  Bed Mobility Supine to Sit;Sit to Supine  Supine to Sit 6: Modified independent (Device/Increase time);HOB flat  Sitting - Scoot to Edge of Bed 6: Modified independent (Device/Increase time)  Sit to Supine 6: Modified independent (Device/Increase time);HOB flat  Transfers  Transfers Stand to Sit;Sit to Stand  Sit to Stand From bed;With upper extremity assist;5: Supervision  Stand to Sit 5: Supervision;With upper extremity assist;To bed  Details for Transfer Assistance for safety due to LOB posteriorly in last PT session but none noted today  Ambulation/Gait  Ambulation/Gait Assistance 5: Supervision  Ambulation Distance (Feet) 200 Feet  Assistive device Rolling walker  Ambulation/Gait Assistance Details focus of ambulation was fluidity/smoothness of gait pattern.    Gait Pattern Step-through pattern  Stairs Yes  Stairs Assistance 4: Min guard  Stair Management Technique No rails;Forwards;With walker  Number of Stairs 1  (2x's)  Exercises  Exercises Total Joint  Total Joint Exercises  Ankle Circles/Pumps AROM;Both;15 reps;Supine  Quad Sets AROM;Strengthening;Both;15 reps;Supine  Heel Slides AAROM;Strengthening;10 reps;Left;Supine  Straight Leg Raises AAROM;Strengthening;Left;10 reps;Supine  Knee Flexion AAROM;PROM;Left;10 reps;Seated  PT - End of Session  Equipment Utilized During Treatment Gait belt  Activity Tolerance Patient tolerated treatment well  Patient left in bed;with call bell/phone  within reach  Nurse Communication Mobility status  PT - Assessment/Plan  Comments on Treatment Session Pt continues to make great progress with PT goals.  Did well with stair training this AM.  Pt mobilizing at an adequate level to safety d/c home when MD feels pt medically ready.    PT Plan Discharge plan remains appropriate  PT Frequency 7X/week  Follow Up Recommendations Home health PT  Equipment Recommended None recommended by PT  Acute Rehab PT Goals  PT Goal: Supine/Side to Sit - Progress Met  PT Goal: Sit to Supine/Side - Progress Met  PT Goal: Sit to Stand - Progress Progressing toward goal  PT Goal: Ambulate - Progress Progressing toward goal  PT Goal: Up/Down Stairs - Progress Progressing toward goal  PT Goal: Perform Home Exercise Program - Progress Progressing toward goal       Verdell Face, [place] [phone] 02/13/2012

## 2012-02-13 NOTE — Progress Notes (Signed)
PT Cancel Note:   Pt deferring 2nd PT session due to wanting to rest before going home & reports having increased pain after AM session.  Pt reports he has no questions/concerns to address with this PTA before d/cing home.    Verdell Face, [place] [phone] 02/13/2012

## 2012-02-13 NOTE — Progress Notes (Signed)
  Georgena Spurling, MD   Altamese Cabal, PA-C [address] [place], [place], [place]  [address]                             [phone]   PROGRESS NOTE  Subjective:  negative for Chest Pain  negative for Shortness of Breath  negative for Nausea/Vomiting   negative for Calf Pain  negative for Bowel Movement   Tolerating Diet: yes         Patient reports pain as 3 on 0-10 scale.    Objective: Vital signs in last 24 hours:   Patient Vitals for the past 24 hrs:  BP Temp Temp src Pulse Resp SpO2  02/13/12 0604 116/72 mmHg 97.1 F (36.2 C) - 74  18  96 %  02/12/12 2108 115/69 mmHg 97.9 F (36.6 C) Oral 79  18  97 %  02/12/12 1455 116/73 mmHg 97 F (36.1 C) - 71  20  97 %    @flow {1959:LAST@   Intake/Output from previous day:   04/23 0701 - 04/24 0700 In: 960 [P.O.:960] Out: 1650 [Urine:1600; Drains:50]   Intake/Output this shift:       Intake/Output      04/23 0701 - 04/24 0700 04/24 0701 - 04/25 0700   P.O. 960    I.V. (mL/kg)     Total Intake(mL/kg) 960 (9.8)    Urine (mL/kg/hr) 1600 (0.7)    Drains 50    Blood     Total Output 1650    Net -690            LABORATORY DATA:  Basename 02/13/12 0619 02/12/12 0518  WBC 9.5 16.5*  HGB 11.1* 12.3*  HCT 32.1* 35.6*  PLT 143* 194    Basename 02/13/12 0619 02/12/12 0518  NA 138 136  K 4.7 5.4*  CL 103 101  CO2 31 26  BUN 16 16  CREATININE 1.06 0.97  GLUCOSE 107* 151*  CALCIUM 8.8 8.8   Lab Results  Component Value Date   INR 0.98 02/04/2012   INR 1.3 07/22/2009   INR 1.0 07/22/2009    Examination:  General appearance: alert, cooperative and no distress Extremities: Homans sign is negative, no sign of DVT  Wound Exam: clean, dry, intact   Drainage:  None: wound tissue dry  Motor Exam: EHL and FHL Intact  Sensory Exam: Deep Peroneal normal  Vascular Exam:    Assessment:    2 Days Post-Op  Procedure(s) (LRB): TOTAL KNEE ARTHROPLASTY (Left)  ADDITIONAL DIAGNOSIS:  Active Problems:  * No active  hospital problems. *   Acute Blood Loss Anemia   Plan: Physical Therapy as ordered Weight Bearing as Tolerated (WBAT)  DVT Prophylaxis:  Lovenox  DISCHARGE PLAN: Home  DISCHARGE NEEDS: HHPT, CPM, Walker and 3-in-1 comode seat         Misako Roeder 02/13/2012, 12:34 PM

## 2012-02-13 NOTE — Discharge Summary (Signed)
PATIENT ID:      Ricardo Jimenez  MRN:     [id number] DOB/AGE:    1951/03/27 / 61 y.o.     DISCHARGE SUMMARY  ADMISSION DATE:    02/11/2012 DISCHARGE DATE:   02/13/2012   ADMISSION DIAGNOSIS: osteoarthritis left knee    DISCHARGE DIAGNOSIS:  osteoarthritis left knee    ADDITIONAL DIAGNOSIS: Active Problems:  * No active hospital problems. *   Past Medical History  Diagnosis Date  . Arthritis   . HTN (hypertension)   . Hypercholesterolemia   . PUD (peptic ulcer disease)   . 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 Kidney anomaly, congenital     horseshoe kidney  . GERD (gastroesophageal reflux disease)     PROCEDURE: Procedure(s): TOTAL KNEE ARTHROPLASTY on 02/11/2012  CONSULTS:     HISTORY:  See H&P in chart  HOSPITAL COURSE:  [name] is a 61 y.o. admitted on 02/11/2012 and found to have a diagnosis of osteoarthritis left knee.  After appropriate laboratory studies were obtained  they were taken to the operating room on 02/11/2012 and underwent Procedure(s): TOTAL KNEE ARTHROPLASTY.   They were given perioperative antibiotics:  Anti-infectives     Start     Dose/Rate Route Frequency Ordered Stop   02/11/12 1630   ceFAZolin (ANCEF) IVPB 2 g/50 mL premix        2 g 100 mL/hr over 30 Minutes Intravenous Every 6 hours 02/11/12 1402 23-Feb-2012 0444   02/10/12 1353   ceFAZolin (ANCEF) IVPB 2 g/50 mL premix        2 g 100 mL/hr over 30 Minutes Intravenous 60 min pre-op 02/10/12 1353 02/11/12 1025        .  Tolerated the procedure well.  Placed with a foley intraoperatively.  Given Ofirmev at induction and for 48 hours.    POD #1, allowed out of bed to a chair.  PT for ambulation and exercise program.  Foley D/C'd in morning.  IV saline locked.  O2 discontionued.  POD #2, continued PT and ambulation .  The remainder of the hospital course was dedicated to ambulation and strengthening.   The patient was discharged on 2 Days Post-Op in  Good condition.  Blood products  given:none  DIAGNOSTIC STUDIES: Recent vital signs: Patient Vitals for the past 24 hrs:  BP Temp Temp src Pulse Resp SpO2  02/13/12 0604 116/72 mmHg 97.1 F (36.2 C) - 74  18  96 %  2012/02/23 2108 115/69 mmHg 97.9 F (36.6 C) Oral 79  18  97 %  02-23-12 1455 116/73 mmHg 97 F (36.1 C) - 71  20  97 %       Recent laboratory studies:  [facility] 02/13/12 0619 Feb 23, 2012 0518  WBC 9.5 16.5*  HGB 11.1* 12.3*  HCT 32.1* 35.6*  PLT 143* 194    Basename 02/13/12 0619 23-Feb-2012 0518  NA 138 136  K 4.7 5.4*  CL 103 101  CO2 31 26  BUN 16 16  CREATININE 1.06 0.97  GLUCOSE 107* 151*  CALCIUM 8.8 8.8   Lab Results  Component Value Date   INR 0.98 02/04/2012   INR 1.3 07/22/2009   INR 1.0 07/22/2009     Recent Radiographic Studies :  Dg Chest 2 View  02/04/2012  *RADIOLOGY REPORT*  Clinical Data: Preoperative evaluation.  CHEST - 2 VIEW  Comparison: 08/16/2009.  Findings: Cardiac silhouette is upper range of normal size. The patient has undergone previous median sternotomy and  coronary artery bypass grafting.  Mediastinal and hilar contours appear stable.  There is slight flattening of diaphragm on lateral image which may reflect element of hyperinflation.  No pulmonary infiltrates or nodules are evident. No pleural abnormality is evident.  Changes of degenerative disc disease and degenerative spondylosis are present.  IMPRESSION: Slight flattening of diaphragm on lateral image may reflect element of hyperinflation.  No acute superimposed pulmonary or pleural abnormalities are evident.  Post CABG.  Original Report Authenticated By: Crawford Givens, M.D.    DISCHARGE INSTRUCTIONS: Discharge Orders    Future Orders Please Complete By Expires   Diet - low sodium heart healthy      Call MD / Call 911      Comments:   If you experience chest pain or shortness of breath, CALL 911 and be transported to the hospital emergency room.  If you develope a fever above 101 F, pus (white drainage) or increased  drainage or redness at the wound, or calf pain, call your surgeon's office.   Constipation Prevention      Comments:   Drink plenty of fluids.  Prune juice may be helpful.  You may use a stool softener, such as Colace (over the counter) 100 mg twice a day.  Use MiraLax (over the counter) for constipation as needed.   Increase activity slowly as tolerated      Weight Bearing as taught in Physical Therapy      Comments:   Use a walker or crutches as instructed.   Driving restrictions      Comments:   No driving for 6 weeks   Lifting restrictions      Comments:   No lifting for 6 weeks   CPM      Comments:   Continuous passive motion machine (CPM):      Use the CPM from 0 to 90 for 6-8 hours per day.      You may increase by 10 per day.  You may break it up into 2 or 3 sessions per day.      Use CPM for 2 weeks or until you are told to stop.   TED hose      Comments:   Use stockings (TED hose) for 3 weeks on both leg(s).  You may remove them at night for sleeping.   Change dressing      Comments:   Change dressing on thursday, then change the dressing daily with sterile 4 x 4 inch gauze dressing and apply TED hose.  You may clean the incision with alcohol prior to redressing.   Do not put a pillow under the knee. Place it under the heel.         DISCHARGE MEDICATIONS:   Medication List  As of 02/13/2012 12:40 PM   STOP taking these medications         aspirin 325 MG tablet      fish oil-omega-3 fatty acids 1000 MG capsule         TAKE these medications         acetaminophen 650 MG CR tablet   Commonly known as: TYLENOL   Take 650 mg by mouth every morning.      COLACE 100 MG capsule   Generic drug: docusate sodium   Take 100 mg by mouth as needed. For stool softner      enoxaparin 40 MG/0.4ML injection   Commonly known as: LOVENOX   Inject 0.4 mLs (40 mg total) into the skin  daily.      ezetimibe-simvastatin 10-40 MG per tablet   Commonly known as: VYTORIN   Take 1  tablet by mouth at bedtime.      gabapentin 300 MG capsule   Commonly known as: NEURONTIN   Take 300-600 mg by mouth 2 (two) times daily. Take 300 mg in the morning and 600 mg at bedtime        glucosamine-chondroitin 500-400 MG tablet   Take 1 tablet by mouth daily.      methocarbamol 500 MG tablet   Commonly known as: ROBAXIN   Take 1-2 tablets (500-1,000 mg total) by mouth every 6 (six) hours as needed.      metoprolol tartrate 25 MG tablet   Commonly known as: LOPRESSOR   Take 25 mg by mouth 2 (two) times daily.      multivitamin tablet   Take 1 tablet by mouth daily.      nitroGLYCERIN 0.4 MG SL tablet   Commonly known as: NITROSTAT   Place 1 tablet (0.4 mg total) under the tongue every 5 (five) minutes as needed for chest pain.      oxyCODONE 5 MG immediate release tablet   Commonly known as: Oxy IR/ROXICODONE   Take 1-2 tablets (5-10 mg total) by mouth every 3 (three) hours as needed.      oxyCODONE 10 MG 12 hr tablet   Commonly known as: OXYCONTIN   Take 1 tablet (10 mg total) by mouth every 12 (twelve) hours.      potassium citrate 5 MEQ (540 MG) SR tablet   Commonly known as: UROCIT-K   Take 5 mEq by mouth 2 (two) times daily.      ranitidine 150 MG tablet   Commonly known as: ZANTAC   take 1 tablet by mouth twice a day      TYLENOL PM EXTRA STRENGTH 25-500 MG Tabs   Generic drug: diphenhydramine-acetaminophen   Take 1 tablet by mouth at bedtime as needed. For sleep      diphenhydramine-acetaminophen 25-500 MG Tabs   Commonly known as: TYLENOL PM   Take 1 tablet by mouth at bedtime as needed.            FOLLOW UP VISIT:   Follow-up Information    Follow up with Raymon Mutton, MD. Call on 02/26/2012.   Contact information:   [address] [place] [address] [phone]          DISPOSITION:  Home    CONDITION:  Good   Blue Ruggerio 02/13/2012, 12:40 PM

## 2012-02-14 NOTE — Op Note (Signed)
TOTAL KNEE REPLACEMENT OPERATIVE NOTE:  02/11/2012  10:11 PM  PATIENT:  Ricardo Jimenez  61 y.o. male  PRE-OPERATIVE DIAGNOSIS:  osteoarthritis left knee  POST-OPERATIVE DIAGNOSIS:  osteoarthritis left knee  PROCEDURE:  Procedure(s): TOTAL KNEE ARTHROPLASTY  SURGEON:  Surgeon(s): Raymon Mutton, MD  PHYSICIAN ASSISTANT: Altamese Cabal, [facility]  ANESTHESIA:   general  DRAINS: Hemovac and On-Q Marcaine Pain Pump  SPECIMEN: None  COUNTS:  Correct  TOURNIQUET:   Total Tourniquet Time Documented: Thigh (Left) - 52 minutes  DICTATION:  Indication for procedure:    The patient is a 61 y.o. male who has failed conservative treatment for osteoarthritis left knee.  Informed consent was obtained prior to anesthesia. The risks versus benefits of the operation were explain and in a way the patient can, and did, understand.   Description of procedure:     The patient was taken to the operating room and placed under anesthesia.  The patient was positioned in the usual fashion taking care that all body parts were adequately padded and/or protected.  I foley catheter was not placed.  A tourniquet was applied and the leg prepped and draped in the usual sterile fashion.  The extremity was exsanguinated with the esmarch and tourniquet inflated to 350 mmHg.  Pre-operative range of motion was normal.  The knee was in 5 degree of mild varus.  A midline incision approximately 6-7 inches long was made with a #10 blade.  A new blade was used to make a parapatellar arthrotomy going 2-3 cm into the quadriceps tendon, over the patella, and alongside the medial aspect of the patellar tendon.  A synovectomy was then performed with the #10 blade and forceps. I then elevated the deep MCL off the medial tibial metaphysis subperiosteally around to the semimembranosus attachment.    I everted the patella and used calipers to measure patellar thickness.  I used the reamer to ream down to appropriate thickness to  recreate the native thickness.  I then removed excess bone with the rongeur and sagittal saw.  I used the appropriately sized template and drilled the three lug holes.  I then put the trial in place and measured the thickness with the calipers to ensure recreation of the native thickness.  The trial was then removed and the patella subluxed and the knee brought into flexion.  A homan retractor was place to retract and protect the patella and lateral structures.  A Z-retractor was place medially to protect the medial structures.  The extra-medullary alignment system was used to make cut the tibial articular surface perpendicular to the anamotic axis of the tibia and in 3 degrees of posterior slope.  The cut surface and alignment jig was removed.  I then used the intramedullary alignment guide to make a 6 valgus cut on the distal femur.  I then marked out the epicondylar axis on the distal femur.  The posterior condylar axis measured 3 degrees.  I then used the anterior referencing sizer and measured the femur to be a size F.  The 4-In-1 cutting block was screwed into place in external rotation matching the posterior condylar angle, making our cuts perpendicular to the epicondylar axis.  Anterior, posterior and chamfer cuts were made with the sagittal saw.  The cutting block and cut pieces were removed.  A lamina spreader was placed in 90 degrees of flexion.  The ACL, PCL, menisci, and posterior condylar osteophytes were removed.  A 10 mm spacer blocked was found to offer good  flexion and extension gap balance after mild in degree releasing.   The scoop retractor was then placed and the femoral finishing block was pinned in place.  The small sagittal saw was used as well as the lug drill to finish the femur.  The block and cut surfaces were removed and the medullary canal hole filled with autograft bone from the cut pieces.  The tibia was delivered forward in deep flexion and external rotation.  A size 6 tray  was selected and pinned into place centered on the medial 1/3 of the tibial tubercle.  The reamer and keel was used to prepare the tibia through the tray.    I then trialed with the size F femur, size 6 tibia, a 10 mm insert and the 32 patella.  I had excellent flexion/extension gap balance, excellent patella tracking.  Flexion was full and beyond 120 degrees; extension was zero.  These components were chosen and the staff opened them to me on the back table while the knee was lavaged copiously and the cement mixed.  I cemented in the components and removed all excess cement.  The polyethylene tibial component was snapped into place and the knee placed in extension while cement was hardening.  The capsule was infilltrated with 20cc of .25% Marcaine with epinephrine.  A hemovac was place in the joint exiting superolaterally.  A pain pump was place superomedially superficial to the arthrotomy.  Once the cement was hard, the tourniquet was let down.  Hemostasis was obtained.  The arthrotomy was closed with figure-8 #1 vicryl sutures.  The deep soft tissues were closed with #0 vicryls and the subcuticular layer closed with a running #2-0 vicryl.  The skin was reapproximated and closed with skin staples.  The wound was dressed with xeroform, 4 x4's, 2 ABD sponges, a single layer of webril and a TED stocking.   The patient was then awakened, extubated, and taken to the recovery room in stable condition.  BLOOD LOSS:  300cc DRAINS: 1 hemovac, 1 pain catheter COMPLICATIONS:  None.  PLAN OF CARE: Admit to inpatient   PATIENT DISPOSITION:  PACU - hemodynamically stable.   Delay start of Pharmacological VTE agent (>24hrs) due to surgical blood loss or risk of bleeding:  not applicable  Please fax a copy of this op note to my office at [phone] (please only include page 1 and 2 of the Case Information op note)

## 2012-02-15 NOTE — Progress Notes (Signed)
CARE MANAGEMENT NOTE 02/15/2012  Comments:  02/15/12 0830 Vance Peper, RN BSN CM received a call from [facility] on 02/14/12 stating they do not accept patient's insurance. CM contacted several HH agencies in the [place] area to get [facility] PT arrraned, without success. Contacted Everlean Alstrom the [place] and informed him of the situation, stated his office would arrange outpatient therapy and will contact the patient.

## 2012-06-27 ENCOUNTER — Other Ambulatory Visit: Payer: Self-pay | Admitting: Cardiovascular Disease

## 2012-10-08 ENCOUNTER — Telehealth: Payer: Self-pay | Admitting: Cardiovascular Disease

## 2012-10-08 NOTE — Telephone Encounter (Signed)
New Problem: ° ° ° °I called the patient and was unable to reach them. I left a message on their voicemail with my name, the reason I called, the name of their physician, and a number to call back to schedule their appointment. ° °

## 2012-11-19 ENCOUNTER — Ambulatory Visit: Payer: BC Managed Care – PPO | Admitting: Cardiovascular Disease

## 2012-12-16 ENCOUNTER — Ambulatory Visit (INDEPENDENT_AMBULATORY_CARE_PROVIDER_SITE_OTHER): Payer: BC Managed Care – PPO | Admitting: Cardiovascular Disease

## 2012-12-16 ENCOUNTER — Encounter: Payer: Self-pay | Admitting: Cardiovascular Disease

## 2012-12-16 VITALS — BP 122/70 | HR 74 | Ht 69.0 in | Wt 231.8 lb

## 2012-12-16 DIAGNOSIS — I2510 Atherosclerotic heart disease of native coronary artery without angina pectoris: Secondary | ICD-10-CM | POA: Insufficient documentation

## 2012-12-16 DIAGNOSIS — I10 Essential (primary) hypertension: Secondary | ICD-10-CM

## 2012-12-16 DIAGNOSIS — E782 Mixed hyperlipidemia: Secondary | ICD-10-CM

## 2012-12-16 MED ORDER — NITROGLYCERIN 0.4 MG SL SUBL: 0.4000 mg | SUBLINGUAL_TABLET | SUBLINGUAL | Status: AC | PRN

## 2012-12-16 NOTE — Assessment & Plan Note (Signed)
Cholesterol is at goal.  Continue current dose of statin and diet Rx.  No myalgias or side effects.  F/U  LFT's in 6 months. No results found for this basename: [facility]  Labs with Dr Almond Lint

## 2012-12-16 NOTE — Progress Notes (Signed)
Patient ID: Ricardo Jimenez, male   DOB: 04-May-1951, 62 y.o.   MRN: [id number] [name] is seen today post CABG 9/10 . He had a total collaterilized LAD, 95% dominant circ and 90% non-dominant RCA that supplied his LAD collaterals. He had a SVG to OM1, SVG OM3, SVG D1 and [place] to LAD. I reviewed his cath films and hospital records. [name] He is working at [employer]. He has some musculoskeletal pain but no dyspnea palpitations or edema. He has been compliant with his meds. Dr Donzetta Sprung folows his lipids and LFTS twice a year. Has now had both knees replaced.  Normal ETT 04/09/11  No complaints.  Needs new nitro.    ROS: Denies fever, malais, weight loss, blurry vision, decreased visual acuity, cough, sputum, SOB, hemoptysis, pleuritic pain, palpitaitons, heartburn, abdominal pain, melena, lower extremity edema, claudication, or rash.  All other systems reviewed and negative  General: Affect appropriate Healthy:  appears stated age HEENT: normal Neck supple with no adenopathy JVP normal no bruits no thyromegaly Lungs clear with no wheezing and good diaphragmatic motion Heart:  S1/S2 no murmur, no rub, gallop or click PMI normal Abdomen: benighn, BS positve, no tenderness, no AAA no bruit.  No HSM or HJR Distal pulses intact with no bruits No edema S/P bilateral knee replacements Neuro non-focal Skin warm and dry No muscular weakness   Current Outpatient Prescriptions  Medication Sig Dispense Refill  . acetaminophen (TYLENOL) 650 MG CR tablet Take 650 mg by mouth every morning.      . diphenhydramine-acetaminophen (TYLENOL PM EXTRA STRENGTH) 25-500 MG TABS Take 1 tablet by mouth at bedtime as needed. For sleep      . docusate sodium (COLACE) 100 MG capsule Take 100 mg by mouth as needed. For stool softner      . gabapentin (NEURONTIN) 300 MG capsule Take 300-600 mg by mouth 2 (two) times daily. Take 300 mg in the morning and 600 mg at bedtime       . glucosamine-chondroitin 500-400 MG tablet  Take 1 tablet by mouth daily.      . metoprolol tartrate (LOPRESSOR) 25 MG tablet Take 25 mg by mouth 2 (two) times daily.       . Multiple Vitamin (MULTIVITAMIN) tablet Take 1 tablet by mouth daily.        [name] NITROSTAT 0.4 MG SL tablet place 1 tablet under the tongue every 5 minutes if needed for chest pain  25 tablet  12  . potassium citrate (UROCIT-K) 5 MEQ (540 MG) SR tablet Take 5 mEq by mouth 2 (two) times daily.      . ranitidine (ZANTAC) 150 MG tablet take 1 tablet by mouth twice a day  60 tablet  4  . simvastatin (ZOCOR) 20 MG tablet Take 20 mg by mouth every evening.       No current facility-administered medications for this visit.    Allergies  Review of patient's allergies indicates no known allergies.  Electrocardiogram:  01/01/12  SR rate 60 normal   Assessment and Plan

## 2012-12-16 NOTE — Patient Instructions (Addendum)
Your physician wants you to follow-up in: YEAR WITH DR NISHAN  You will receive a reminder letter in the mail two months in advance. If you don't receive a letter, please call our office to schedule the follow-up appointment.  Your physician recommends that you continue on your current medications as directed. Please refer to the Current Medication list given to you today. 

## 2012-12-16 NOTE — Assessment & Plan Note (Signed)
Well controlled.  Continue current medications and low sodium Dash type diet.    

## 2012-12-16 NOTE — Assessment & Plan Note (Signed)
Stable with no angina and good activity level.  Continue medical Rx Refill for nitro called in 

## 2013-12-24 ENCOUNTER — Encounter: Payer: Self-pay | Admitting: General Surgery

## 2013-12-24 ENCOUNTER — Other Ambulatory Visit: Payer: Self-pay | Admitting: Cardiovascular Disease

## 2013-12-24 ENCOUNTER — Ambulatory Visit (INDEPENDENT_AMBULATORY_CARE_PROVIDER_SITE_OTHER): Payer: Self-pay | Admitting: Cardiovascular Disease

## 2013-12-24 ENCOUNTER — Encounter: Payer: Self-pay | Admitting: Cardiovascular Disease

## 2013-12-24 VITALS — BP 147/87 | HR 64 | Ht 69.0 in | Wt 236.0 lb

## 2013-12-24 DIAGNOSIS — I209 Angina pectoris, unspecified: Secondary | ICD-10-CM

## 2013-12-24 DIAGNOSIS — I208 Other forms of angina pectoris: Secondary | ICD-10-CM

## 2013-12-24 DIAGNOSIS — I2510 Atherosclerotic heart disease of native coronary artery without angina pectoris: Secondary | ICD-10-CM

## 2013-12-24 DIAGNOSIS — I10 Essential (primary) hypertension: Secondary | ICD-10-CM

## 2013-12-24 LAB — BASIC METABOLIC PANEL
BUN: 20 mg/dL (ref 6–23)
CHLORIDE: 105 meq/L (ref 96–112)
CO2: 26 meq/L (ref 19–32)
Calcium: 9.3 mg/dL (ref 8.4–10.5)
Creatinine, Ser: 1.2 mg/dL (ref 0.4–1.5)
GFR: 63.69 mL/min (ref >60.00)
GLUCOSE: 107 mg/dL — AB (ref 70–99)
POTASSIUM: 3.9 meq/L (ref 3.5–5.1)
Sodium: 139 mEq/L (ref 135–145)

## 2013-12-24 LAB — CBC WITH DIFFERENTIAL/PLATELET
Basophils Absolute: 0 10*3/uL (ref 0.0–0.1)
Basophils Relative: 0.7 % (ref 0.0–3.0)
EOS ABS: 0.3 10*3/uL (ref 0.0–0.7)
EOS PCT: 4.6 % (ref 0.0–5.0)
HEMATOCRIT: 43.1 % (ref 39.0–52.0)
Hemoglobin: 14.6 g/dL (ref 13.0–17.0)
LYMPHS ABS: 2.1 10*3/uL (ref 0.7–4.0)
Lymphocytes Relative: 37.9 % (ref 12.0–46.0)
MCHC: 34 g/dL (ref 30.0–36.0)
MCV: 92.3 fl (ref 78.0–100.0)
MONO ABS: 0.5 10*3/uL (ref 0.1–1.0)
Monocytes Relative: 8.9 % (ref 3.0–12.0)
Neutro Abs: 2.7 10*3/uL (ref 1.4–7.7)
Neutrophils Relative %: 47.9 % (ref 43.0–77.0)
PLATELETS: 192 10*3/uL (ref 150.0–400.0)
RBC: 4.67 Mil/uL (ref 4.22–5.81)
RDW: 13.5 % (ref 11.5–14.6)
WBC: 5.6 10*3/uL (ref 4.5–10.5)

## 2013-12-24 LAB — PROTIME-INR
INR: 1.1 ratio — ABNORMAL HIGH (ref 0.8–1.0)
PROTHROMBIN TIME: 11.4 s (ref 10.2–12.4)

## 2013-12-24 MED ORDER — ISOSORBIDE MONONITRATE ER 30 MG PO TB24: 30.0000 mg | ORAL_TABLET | Freq: Every day | ORAL | Status: DC

## 2013-12-24 NOTE — Assessment & Plan Note (Signed)
New onset angina.  No rest pain.  ECG normal at rest  Have arranged cath with Dr Tamala Julian Tuesday.  Patient did not want to proceed tomorrow Add imdur 30 mg to beta blocker He knows to go to ER for any prolonged pain not helped by nitro.  Labs today.  Risks discussed willing to proceed.

## 2013-12-24 NOTE — Assessment & Plan Note (Signed)
Well controlled.  Continue current medications and low sodium Dash type diet.    

## 2013-12-24 NOTE — Patient Instructions (Signed)
Your physician has recommended you make the following change in your medication: 1. Start Imdur 30 MG 1 tablet Daily  Your physician has requested that you have a cardiac catheterization. Cardiac catheterization is used to diagnose and/or treat various heart conditions. Doctors may recommend this procedure for a number of different reasons. The most common reason is to evaluate chest pain. Chest pain can be a symptom of coronary artery disease (CAD), and cardiac catheterization can show whether plaque is narrowing or blocking your heart's arteries. This procedure is also used to evaluate the valves, as well as measure the blood flow and oxygen levels in different parts of your heart. For further information please visit [email or web address]. Please follow instruction sheet, as given. ( scheduled on 12/29/13 at 9:30)

## 2013-12-24 NOTE — Assessment & Plan Note (Signed)
Cholesterol is at goal.  Continue current dose of statin and diet Rx.  No myalgias or side effects.  F/U  LFT's in 6 months. No results found for this basename: [facility]  Labs with primary in [place]

## 2013-12-24 NOTE — Progress Notes (Signed)
Patient ID: Ricardo Jimenez, male   DOB: 1951/01/01, 63 y.o.   MRN: [id number] Ricardo Jimenez is seen today post CABG 9/10 . He had a total collaterilized LAD, 95% dominant circ and 90% non-dominant RCA that supplied his LAD collaterals. He had a SVG to OM1, SVG OM3, SVG D1 and [place] to LAD. I reviewed his cath films and hospital records. [name] He is working at [employer]. He has some musculoskeletal pain but no dyspnea palpitations or edema. He has been compliant with his meds. Dr Gar Ponto folows his lipids and LFTS twice a year. Has now had both knees replaced.  Normal ETT 04/09/11   Over last week has had SSCP consistant with angina No rest pain First episode with bowling.  Lasted a minute or two and relieved with one nitro Recurred during basketball practices and opening gym for [employer].  He does not want cath tomorrow as offerred due to  [facility] responsibilities.      ROS: Denies fever, malais, weight loss, blurry vision, decreased visual acuity, cough, sputum, SOB, hemoptysis, pleuritic pain, palpitaitons, heartburn, abdominal pain, melena, lower extremity edema, claudication, or rash.  All other systems reviewed and negative  General: Affect appropriate Healthy:  appears stated age 63: normal Neck supple with no adenopathy JVP normal no bruits no thyromegaly Lungs clear with no wheezing and good diaphragmatic motion Heart:  S1/S2 no murmur, no rub, gallop or click PMI normal Abdomen: benighn, BS positve, no tenderness, no AAA no bruit.  No HSM or HJR Distal pulses intact with no bruits No edema Neuro non-focal Skin warm and dry No muscular weakness   Current Outpatient Prescriptions  Medication Sig Dispense Refill  . acetaminophen (TYLENOL) 650 MG CR tablet Take 650 mg by mouth every morning.      . diphenhydramine-acetaminophen (TYLENOL PM EXTRA STRENGTH) 25-500 MG TABS Take 1 tablet by mouth at bedtime as needed. For sleep      . docusate sodium (COLACE) 100 MG capsule  Take 100 mg by mouth as needed. For stool softner      . gabapentin (NEURONTIN) 300 MG capsule Take 300-600 mg by mouth 2 (two) times daily. Take 300 mg in the morning and 600 mg at bedtime       . glucosamine-chondroitin 500-400 MG tablet Take 1 tablet by mouth daily.      . metoprolol tartrate (LOPRESSOR) 25 MG tablet Take 25 mg by mouth 2 (two) times daily.       . Multiple Vitamin (MULTIVITAMIN) tablet Take 1 tablet by mouth daily.        . nitroGLYCERIN (NITROSTAT) 0.4 MG SL tablet Place 1 tablet (0.4 mg total) under the tongue every 5 (five) minutes as needed for chest pain.  25 tablet  4  . potassium citrate (UROCIT-K) 5 MEQ (540 MG) SR tablet Take 5 mEq by mouth 2 (two) times daily.      . ranitidine (ZANTAC) 150 MG tablet take 1 tablet by mouth twice a day  60 tablet  4  . simvastatin (ZOCOR) 20 MG tablet Take 20 mg by mouth every evening.       No current facility-administered medications for this visit.    Allergies  Review of patient's allergies indicates no known allergies.  Electrocardiogram:  NSR normal ECG   Assessment and Plan

## 2013-12-27 ENCOUNTER — Encounter (HOSPITAL_COMMUNITY): Payer: Self-pay | Admitting: Nurse Practitioner

## 2013-12-27 ENCOUNTER — Inpatient Hospital Stay (HOSPITAL_COMMUNITY)
Admission: AD | Admit: 2013-12-27 | Discharge: 2013-12-29 | DRG: 247 | Disposition: A | Discharge status: Home / Self Care | Payer: No Typology Code available for payment source | Source: Other Acute Inpatient Hospital | Attending: Internal Medicine | Admitting: Internal Medicine

## 2013-12-27 DIAGNOSIS — Z955 Presence of coronary angioplasty implant and graft: Secondary | ICD-10-CM

## 2013-12-27 DIAGNOSIS — R079 Chest pain, unspecified: Secondary | ICD-10-CM

## 2013-12-27 DIAGNOSIS — I2582 Chronic total occlusion of coronary artery: Secondary | ICD-10-CM | POA: Diagnosis present

## 2013-12-27 DIAGNOSIS — Z79899 Other long term (current) drug therapy: Secondary | ICD-10-CM

## 2013-12-27 DIAGNOSIS — Q638 Other specified congenital malformations of kidney: Secondary | ICD-10-CM

## 2013-12-27 DIAGNOSIS — K219 Gastro-esophageal reflux disease without esophagitis: Secondary | ICD-10-CM | POA: Diagnosis present

## 2013-12-27 DIAGNOSIS — Z8711 Personal history of peptic ulcer disease: Secondary | ICD-10-CM

## 2013-12-27 DIAGNOSIS — E782 Mixed hyperlipidemia: Secondary | ICD-10-CM

## 2013-12-27 DIAGNOSIS — Z96659 Presence of unspecified artificial knee joint: Secondary | ICD-10-CM

## 2013-12-27 DIAGNOSIS — I10 Essential (primary) hypertension: Secondary | ICD-10-CM

## 2013-12-27 DIAGNOSIS — Z7982 Long term (current) use of aspirin: Secondary | ICD-10-CM

## 2013-12-27 DIAGNOSIS — I2510 Atherosclerotic heart disease of native coronary artery without angina pectoris: Secondary | ICD-10-CM

## 2013-12-27 DIAGNOSIS — I25810 Atherosclerosis of coronary artery bypass graft(s) without angina pectoris: Secondary | ICD-10-CM | POA: Diagnosis present

## 2013-12-27 DIAGNOSIS — I214 Non-ST elevation (NSTEMI) myocardial infarction: Principal | ICD-10-CM

## 2013-12-27 DIAGNOSIS — Z7902 Long term (current) use of antithrombotics/antiplatelets: Secondary | ICD-10-CM

## 2013-12-27 DIAGNOSIS — M1990 Unspecified osteoarthritis, unspecified site: Secondary | ICD-10-CM | POA: Diagnosis present

## 2013-12-27 HISTORY — DX: Atherosclerotic heart disease of native coronary artery without angina pectoris: I25.10

## 2013-12-27 HISTORY — DX: Lobulated, fused and horseshoe kidney: Q63.1

## 2013-12-27 HISTORY — DX: Unspecified osteoarthritis, unspecified site: M19.90

## 2013-12-27 LAB — TROPONIN I
Troponin I: 0.37 ng/mL (ref <0.30)
Troponin I: <0.3 ng/mL (ref <0.30)

## 2013-12-27 LAB — HEPARIN LEVEL (UNFRACTIONATED): Heparin Unfractionated: 0.37 IU/mL (ref 0.30–0.70)

## 2013-12-27 MED ORDER — GABAPENTIN 300 MG PO CAPS
600.0000 mg | ORAL_CAPSULE | Freq: Every day | ORAL | Status: DC
  Administered 2013-12-27 – 2013-12-28 (×2): 600 mg via ORAL
  Filled 2013-12-27 (×4): qty 2

## 2013-12-27 MED ORDER — GABAPENTIN 300 MG PO CAPS
300.0000 mg | ORAL_CAPSULE | Freq: Every morning | ORAL | Status: DC
  Administered 2013-12-27 – 2013-12-29 (×3): 300 mg via ORAL
  Filled 2013-12-27 (×3): qty 1

## 2013-12-27 MED ORDER — SODIUM CHLORIDE 0.9 % IJ SOLN: 3.0000 mL | INTRAMUSCULAR | Status: DC | PRN

## 2013-12-27 MED ORDER — SODIUM CHLORIDE 0.9 % IJ SOLN
3.0000 mL | Freq: Two times a day (BID) | INTRAMUSCULAR | Status: DC
  Administered 2013-12-27: 3 mL via INTRAVENOUS

## 2013-12-27 MED ORDER — POTASSIUM CITRATE ER 10 MEQ (1080 MG) PO TBCR
10.0000 meq | EXTENDED_RELEASE_TABLET | Freq: Every morning | ORAL | Status: DC
  Filled 2013-12-27: qty 1

## 2013-12-27 MED ORDER — HEPARIN (PORCINE) IN NACL 100-0.45 UNIT/ML-% IJ SOLN
1700.0000 [IU]/h | INTRAMUSCULAR | Status: DC
  Administered 2013-12-28: 1500 [IU]/h via INTRAVENOUS
  Filled 2013-12-27 (×3): qty 250

## 2013-12-27 MED ORDER — FAMOTIDINE 20 MG PO TABS
20.0000 mg | ORAL_TABLET | Freq: Every day | ORAL | Status: DC
  Administered 2013-12-27 – 2013-12-28 (×2): 20 mg via ORAL
  Filled 2013-12-27 (×3): qty 1

## 2013-12-27 MED ORDER — ONE-DAILY MULTI VITAMINS PO TABS: 1.0000 | ORAL_TABLET | Freq: Every day | ORAL | Status: DC

## 2013-12-27 MED ORDER — GABAPENTIN 300 MG PO CAPS: 300.0000 mg | ORAL_CAPSULE | Freq: Two times a day (BID) | ORAL | Status: DC

## 2013-12-27 MED ORDER — HEPARIN BOLUS VIA INFUSION
2500.0000 [IU] | Freq: Once | INTRAVENOUS | Status: AC
  Administered 2013-12-27: 2500 [IU] via INTRAVENOUS
  Filled 2013-12-27: qty 2500

## 2013-12-27 MED ORDER — ISOSORBIDE MONONITRATE ER 30 MG PO TB24
30.0000 mg | ORAL_TABLET | Freq: Every day | ORAL | Status: DC
  Administered 2013-12-27 – 2013-12-29 (×3): 30 mg via ORAL
  Filled 2013-12-27 (×3): qty 1

## 2013-12-27 MED ORDER — ASPIRIN 81 MG PO CHEW
81.0000 mg | CHEWABLE_TABLET | ORAL | Status: AC
  Administered 2013-12-28: 81 mg via ORAL

## 2013-12-27 MED ORDER — METOPROLOL TARTRATE 25 MG PO TABS
25.0000 mg | ORAL_TABLET | Freq: Two times a day (BID) | ORAL | Status: DC
  Administered 2013-12-27 – 2013-12-29 (×4): 25 mg via ORAL
  Filled 2013-12-27 (×7): qty 1

## 2013-12-27 MED ORDER — POTASSIUM CITRATE ER 5 MEQ (540 MG) PO TBCR: 5.0000 meq | EXTENDED_RELEASE_TABLET | Freq: Two times a day (BID) | ORAL | Status: DC

## 2013-12-27 MED ORDER — POTASSIUM CHLORIDE CRYS ER 10 MEQ PO TBCR
10.0000 meq | EXTENDED_RELEASE_TABLET | Freq: Every day | ORAL | Status: DC
  Administered 2013-12-27 – 2013-12-28 (×2): 10 meq via ORAL
  Filled 2013-12-27 (×4): qty 1

## 2013-12-27 MED ORDER — SODIUM CHLORIDE 0.9 % IV SOLN
1.0000 mL/kg/h | INTRAVENOUS | Status: DC
  Administered 2013-12-28 (×2): 1 mL/kg/h via INTRAVENOUS

## 2013-12-27 MED ORDER — DIPHENHYDRAMINE HCL 25 MG PO CAPS: 25.0000 mg | ORAL_CAPSULE | Freq: Every evening | ORAL | Status: DC | PRN

## 2013-12-27 MED ORDER — ADULT MULTIVITAMIN W/MINERALS CH
1.0000 | ORAL_TABLET | Freq: Every day | ORAL | Status: DC
  Administered 2013-12-27 – 2013-12-28 (×2): 1 via ORAL
  Filled 2013-12-27 (×3): qty 1

## 2013-12-27 MED ORDER — SIMVASTATIN 20 MG PO TABS
20.0000 mg | ORAL_TABLET | Freq: Every evening | ORAL | Status: DC
  Administered 2013-12-27 – 2013-12-28 (×2): 20 mg via ORAL
  Filled 2013-12-27 (×2): qty 1

## 2013-12-27 MED ORDER — SODIUM CHLORIDE 0.9 % IV SOLN: 250.0000 mL | INTRAVENOUS | Status: DC | PRN

## 2013-12-27 MED ORDER — DIPHENHYDRAMINE-APAP (SLEEP) 25-500 MG PO TABS: 1.0000 | ORAL_TABLET | Freq: Every evening | ORAL | Status: DC | PRN

## 2013-12-27 MED ORDER — NITROGLYCERIN 0.4 MG SL SUBL: 0.4000 mg | SUBLINGUAL_TABLET | SUBLINGUAL | Status: DC | PRN

## 2013-12-27 MED ORDER — ASPIRIN EC 81 MG PO TBEC
81.0000 mg | DELAYED_RELEASE_TABLET | Freq: Every day | ORAL | Status: DC
  Administered 2013-12-29: 09:00:00 81 mg via ORAL
  Filled 2013-12-27 (×2): qty 1

## 2013-12-27 MED ORDER — SODIUM CHLORIDE 0.9 % IJ SOLN: 3.0000 mL | Freq: Two times a day (BID) | INTRAMUSCULAR | Status: DC

## 2013-12-27 NOTE — Progress Notes (Signed)
ANTICOAGULATION CONSULT NOTE - Follow Up Consult  Pharmacy Consult for Heparin Indication: chest pain/ACS  No Known Allergies  Patient Measurements: Height: 5\' 10"  (177.8 cm) Weight: 235 lb 14.3 oz (107 kg) IBW/kg (Calculated) : 73   X1.25= 91 Heparin Dosing Weight: 96  Vital Signs: Temp: 97.5 F (36.4 C) (03/08 1332) Temp src: Oral (03/08 1332) BP: 95/64 mmHg (03/08 1332) Pulse Rate: 55 (03/08 1332)  Labs:  Recent Labs  12/27/13 1235 12/27/13 1245  HEPARINUNFRC <0.10*  --   TROPONINI  --  0.37*    Estimated Creatinine Clearance: 77.2 ml/min (by C-G formula based on Cr of 1.2).   Medical History: Past Medical History  Diagnosis Date  . CAD (coronary artery disease)     a. 06/2009 Cath: 3VD, nondominant RCA;  b. 06/2009 CABG x 4: LIMA->LAD, VG->OM1, VG->OM3, VG->D1;  c. 03/2011 ETT: normal.  . HTN (hypertension)   . Hypercholesterolemia   . PUD (peptic ulcer disease)   . 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 GERD (gastroesophageal reflux disease)   . Osteoarthritis   . Horseshoe kidney     Assessment: 63 yo M admitted 12/27/2013 on transfer from [facility] with CP.  Pharmacy consulted to continue heparin.  PMH: CAD (CABG 2010), HTN, hyperlipidemia, PUD, GERD, OA,   Coag:  ACS, on heparin from [facility] at 1000 units/hr. Plan for cath lab tomorrow. Heparin level reported as < 0.1  Goal of Therapy:  Heparin level 0.3-0.7 units/ml Monitor platelets by anticoagulation protocol: Yes   Plan:  1. Increase heparin to 1500 units/hr, 2500 unitsx1 2. Follow up heparin level at steady state 3. Daily heparin level and CBC   Thank you for allowing pharmacy to be a part of this patients care team.  Rowe Maico Pharm.D., BCPS Clinical Pharmacist 12/27/2013 2:30 PM Pager: [phone] Phone: [phone]

## 2013-12-27 NOTE — Progress Notes (Addendum)
ANTICOAGULATION CONSULT NOTE - Initial Consult  Pharmacy Consult for Heparin Indication: chest pain/ACS  No Known Allergies  Patient Measurements: Height: 5\' 10"  (177.8 cm) Weight: 235 lb 14.3 oz (107 kg) IBW/kg (Calculated) : 73   X1.25= 91 Heparin Dosing Weight: 96  Vital Signs: Temp: 98 F (36.7 C) (03/08 0955) BP: 120/71 mmHg (03/08 0955) Pulse Rate: 67 (03/08 0955)  Labs: No results found for this basename: HGB, HCT, PLT, APTT, LABPROT, INR, HEPARINUNFRC, CREATININE, CKTOTAL, CKMB, TROPONINI,  in the last 72 hours  Estimated Creatinine Clearance: 77.2 ml/min (by C-G formula based on Cr of 1.2).   Medical History: Past Medical History  Diagnosis Date  . CAD (coronary artery disease)     a. 06/2009 Cath: 3VD, nondominant RCA;  b. 06/2009 CABG x 4: LIMA->LAD, VG->OM1, VG->OM3, VG->D1;  c. 03/2011 ETT: normal.  . HTN (hypertension)   . Hypercholesterolemia   . PUD (peptic ulcer disease)   . 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 GERD (gastroesophageal reflux disease)   . Osteoarthritis   . Horseshoe kidney     Assessment: 63 yo M admitted 12/27/2013 on transfer from [facility] with CP.  Pharmacy consulted to continue heparin.  PMH: CAD (CABG 2010), HTN, hyperlipidemia, PUD, GERD, OA,   Coag:  ACS, on heparin from [facility] at 1000 units/hr. Plan for cath lab tomorrow  Goal of Therapy:  Heparin level 0.3-0.7 units/ml Monitor platelets by anticoagulation protocol: Yes   Plan:  1. Continue heparin at 1000 units/hr 2. Follow up heparin level at steady state 3. Daily heparin level and CBC   Thank you for allowing pharmacy to be a part of this patients care team.  Rowe Brigham Pharm.D., BCPS Clinical Pharmacist 12/27/2013 10:16 AM Pager: [phone] Phone: [phone]

## 2013-12-27 NOTE — Progress Notes (Signed)
CRITICAL VALUE ALERT  Critical value received:  Troponin 0.37  Date of notification:  12/27/2013  Time of notification:  0174  Critical value read back:yes  Nurse who received alert:  Dorna Bloom, RN  MD notified (1st page):  Ignacia Bayley, NP

## 2013-12-27 NOTE — Progress Notes (Signed)
[place] for Heparin Indication: chest pain/ACS  No Known Allergies  Patient Measurements: Height: 5\' 10"  (177.8 cm) Weight: 235 lb 14.3 oz (107 kg) IBW/kg (Calculated) : 73    Heparin Dosing Weight: 96  Vital Signs: Temp: 98.5 F (36.9 C) (03/08 2100) Temp src: Oral (03/08 1332) BP: 110/63 mmHg (03/08 2137) Pulse Rate: 65 (03/08 2137)  Labs:  Recent Labs  12/27/13 1235 12/27/13 1245 12/27/13 1858 12/27/13 2145  HEPARINUNFRC <0.10*  --   --  0.37  TROPONINI  --  0.37* <0.30  --     Estimated Creatinine Clearance: 77.2 ml/min (by C-G formula based on Cr of 1.2).  Assessment: 63 y.o. male with chest pain for heparin   Goal of Therapy:  Heparin level 0.3-0.7 units/ml Monitor platelets by anticoagulation protocol: Yes   Plan:  Continue Heparin at current rate  Phillis Knack, PharmD, BCPS

## 2013-12-27 NOTE — H&P (Signed)
Patient ID: Ricardo Jimenez MRN: [id number], DOB/AGE: 06-25-1951   Admit date: 12/27/2013  Primary Physician: Gar Ponto, MD Primary Cardiologist: P. Johnsie Cancel, MD   Pt. Profile:  63 y/o male with a h/o CAD s/p CABG in 2010 who was recently seen by P. Johnsie Cancel, MD on 3/5 secondary to [place] and was set up for cath but was admitted to [facility] yesterday with recurrent pain and found to have a mildly elevated troponin.  Problem List  Past Medical History  Diagnosis Date  . CAD (coronary artery disease)     a. 06/2009 Cath: 3VD, nondominant RCA;  b. 06/2009 CABG x 4: LIMA->LAD, VG->OM1, VG->OM3, VG->D1;  c. 03/2011 ETT: normal.  . HTN (hypertension)   . Hypercholesterolemia   . PUD (peptic ulcer disease)   . 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 GERD (gastroesophageal reflux disease)   . Osteoarthritis   . Horseshoe kidney     Past Surgical History  Procedure Laterality Date  . Other surgical history      BIL.CARPAL TUNNEL SURG.  . Rt.knee replacement    . Cardiac catheterization  CABG  . Kidney stone surgery    . Carpal tunnel release      bil  . Knee arthroscopy      rt x2, lft x3  . Coronary artery bypass graft  2010  . Hernia repair  2011    rt ing  . Total knee arthroplasty  02/11/2012    Procedure: TOTAL KNEE ARTHROPLASTY;  Surgeon: Rudean Haskell, MD;  Location: [place];  Service: Orthopedics;  Laterality: Left;  left total knee replacement    Allergies  No Known Allergies  HPI  63 y/o male with a h/o CAD s/p CABG x 4 in 06/2009.  He was recently seen in clinic by Dr. Johnsie Cancel on 3/5, and reported recurrent exertional chest discomfort over the preceding week.  Ss were similar to prior angina and decision was made to pursue diagnostic catheterization, which was scheduled for 3/10.  Unfortunately, he continued to experience exertional angina with less and less activity and over the past few nights, he has also had nocturnal angina.  Last night, he noted exertional chest  discomfort while helping move some things at church, which resolved with rest.  When he tried to get to sleep last night, he had recurrent chest discomfort which did not initially resolve with sl ntg.  His wife drove him to the [facility] ED and by the time he arrived, he was pain free.  There, initial ECG showed lateral ST depression, which resolved by f/u ECG under an hour later.  His troponin was mildly elevated @ 0.06 and he was placed on heparin and transferred to [facility] for further evaluation.  He is currently pain free and denies palpitations, dyspnea, pnd, orthopnea, n, v, dizziness, syncope, edema, weight gain, or early satiety.   Home Medications  Prior to Admission medications   Medication Sig Start Date End Date Taking? Authorizing Provider  acetaminophen (TYLENOL) 650 MG CR tablet Take 650 mg by mouth every morning.    Historical Provider, MD  diphenhydramine-acetaminophen (TYLENOL PM EXTRA STRENGTH) 25-500 MG TABS Take 1 tablet by mouth at bedtime as needed. For sleep    Historical Provider, MD  docusate sodium (COLACE) 100 MG capsule Take 100 mg by mouth as needed. For stool softner    Historical Provider, MD  gabapentin (NEURONTIN) 300 MG capsule Take 300-600 mg by mouth 2 (two) times daily. Take 300  mg in the morning and 600 mg at bedtime     Historical Provider, MD  glucosamine-chondroitin 500-400 MG tablet Take 1 tablet by mouth daily.    Historical Provider, MD  isosorbide mononitrate (IMDUR) 30 MG 24 hr tablet Take 1 tablet (30 mg total) by mouth daily. 12/24/13   Josue Hector, MD  metoprolol tartrate (LOPRESSOR) 25 MG tablet Take 25 mg by mouth 2 (two) times daily.  12/15/11   Historical Provider, MD  Multiple Vitamin (MULTIVITAMIN) tablet Take 1 tablet by mouth daily.      Historical Provider, MD  nitroGLYCERIN (NITROSTAT) 0.4 MG SL tablet Place 1 tablet (0.4 mg total) under the tongue every 5 (five) minutes as needed for chest pain. 12/16/12   Josue Hector, MD  potassium citrate  (UROCIT-K) 5 MEQ (540 MG) SR tablet Take 5 mEq by mouth 2 (two) times daily.    Historical Provider, MD  ranitidine (ZANTAC) 150 MG tablet take 1 tablet by mouth twice a day 07/21/11   Josue Hector, MD  simvastatin (ZOCOR) 20 MG tablet Take 20 mg by mouth every evening.    Historical Provider, MD   Family History  Family History  Problem Relation Age of Onset  . COPD Father     AGE 59   Social History  History   Social History  . Marital Status: Married    Spouse Name: N/A    Number of Children: N/A  . Years of Education: N/A   Occupational History  . Not on file.   Social History Main Topics  . Smoking status: Never Smoker   . Smokeless tobacco: Not on file     Comment: occ beer  . Alcohol Use: Yes  . Drug Use: No  . Sexual Activity: Not on file   Other Topics Concern  . Not on file   Social History Narrative   Married and lives in [place], [place] with his wife.  2 children.  Works as Photographer.    Review of Systems General:  No chills, fever, night sweats or weight changes.  Cardiovascular:  +++ chest pain, +++ dyspnea on exertion, no edema, orthopnea, palpitations, paroxysmal nocturnal dyspnea. Dermatological: No rash, lesions/masses Respiratory: No cough, +++ dyspnea Urologic: No hematuria, dysuria Abdominal:   No nausea, vomiting, diarrhea, bright red blood per rectum, melena, or hematemesis Neurologic:  No visual changes, wkns, changes in mental status. All other systems reviewed and are otherwise negative except as noted above.  Physical Exam  Blood pressure 120/71, pulse 67, temperature 98 F (36.7 C), resp. rate 18, height 5\' 10"  (1.778 m), weight 235 lb 14.3 oz (107 kg), SpO2 98.00%.  General: Pleasant, NAD Psych: Normal affect. Neuro: Alert and oriented X 3. Moves all extremities spontaneously. HEENT: Normal  Neck: Supple without bruits or JVD. Lungs:  Resp regular and unlabored, CTA. Heart: RRR no s3, s4, or murmurs. Abdomen: Soft, non-tender,  non-distended, BS + x 4.  Extremities: No clubbing, cyanosis or edema. DP/PT/Radials 2+ and equal bilaterally.  Nl Allen's on left.  Labs   Hemoglobin 14.5, hematocrit 42.7, WBC 7.4, platelets 163  Sodium 139, potassium 3.9, chloride 107, CO2 24, BUN 19, creatinine 1.16, glucose 108  CK 113, MB 2.0, BNP 179, troponin I 0.06  PT 13.7, INR 1.1  Radiology/Studies  Chest x-ray from [facility] hospital suggests probable CHF.   ECG  3/8 5:56 AM: SB, 58, baseline artifact, 39mm lat ST dep.  3/8 6:46 AM: SB 58, resolution of lateral  changes.  ASSESSMENT AND PLAN  1.  NSTEMI/CAD:  Pt has been having exertional angina for the past 7-10 days and over the past several nights has also been having rest/nocturnal angina.  Last night's episode was longer than prior episodes thus prompting his presentation to the ED where his initial ECG showed lateral ischemia, which improved on f/u.  Troponin is mildly elevated @ 0.06.  He is currently pain free.  Cont IV heparin and home doses of bb, statin, asa, and oral nitrate.  Will plan for cath tomorrow or sooner if necessary for recurrent/recalcitrant chest pain.  The patient understands that risks include but are not limited to stroke (1 in 1000), death (1 in 53), kidney failure [usually temporary] (1 in 500), bleeding (1 in 200), allergic reaction [possibly serious] (1 in 200), and agrees to proceed.  Of note, CXR from [facility] was read as possible CHF.  Pt is euvolemic on exam.  BNP only 179.  No need for diuresis.  2.  HTN:  Stable.  Cont bb.  3.  HL:  Cont statin.  Signed, Murray Hodgkins, NP 12/27/2013, 10:30 AM  Cardiology Attending  Patient seen and examined. Agree with above history, physical exam, assessment and plan. I have reviewed all pertinent findings. He has been transferred with [place]. We will plan left heart cath tomorrow. Additional rec's pending results of the heart cath.  Mikle Bosworth.D.

## 2013-12-28 ENCOUNTER — Encounter (HOSPITAL_COMMUNITY)
Admission: AD | Disposition: A | Discharge status: Home / Self Care | Payer: No Typology Code available for payment source | Source: Other Acute Inpatient Hospital | Attending: Internal Medicine

## 2013-12-28 DIAGNOSIS — E785 Hyperlipidemia, unspecified: Secondary | ICD-10-CM

## 2013-12-28 DIAGNOSIS — I25810 Atherosclerosis of coronary artery bypass graft(s) without angina pectoris: Secondary | ICD-10-CM

## 2013-12-28 HISTORY — PX: LEFT HEART CATHETERIZATION WITH CORONARY/GRAFT ANGIOGRAM: SHX5450

## 2013-12-28 LAB — TROPONIN I: Troponin I: <0.3 ng/mL (ref <0.30)

## 2013-12-28 LAB — COMPREHENSIVE METABOLIC PANEL
ALBUMIN: 3.4 g/dL — AB (ref 3.5–5.2)
ALT: 18 U/L (ref 0–53)
AST: 25 U/L (ref 0–37)
Alkaline Phosphatase: 55 U/L (ref 39–117)
BUN: 17 mg/dL (ref 6–23)
CO2: 24 mEq/L (ref 19–32)
CREATININE: 1.08 mg/dL (ref 0.50–1.35)
Calcium: 9 mg/dL (ref 8.4–10.5)
Chloride: 106 mEq/L (ref 96–112)
GFR calc Af Amer: 82 mL/min — ABNORMAL LOW (ref >90)
GFR calc non Af Amer: 71 mL/min — ABNORMAL LOW (ref >90)
Glucose, Bld: 99 mg/dL (ref 70–99)
POTASSIUM: 4.9 meq/L (ref 3.7–5.3)
Sodium: 142 mEq/L (ref 137–147)
TOTAL PROTEIN: 6.7 g/dL (ref 6.0–8.3)
Total Bilirubin: 0.4 mg/dL (ref 0.3–1.2)

## 2013-12-28 LAB — CBC
HCT: 40.8 % (ref 39.0–52.0)
Hemoglobin: 14.2 g/dL (ref 13.0–17.0)
MCH: 31.7 pg (ref 26.0–34.0)
MCHC: 34.8 g/dL (ref 30.0–36.0)
MCV: 91.1 fL (ref 78.0–100.0)
PLATELETS: 164 10*3/uL (ref 150–400)
RBC: 4.48 MIL/uL (ref 4.22–5.81)
RDW: 13.3 % (ref 11.5–15.5)
WBC: 6.6 10*3/uL (ref 4.0–10.5)

## 2013-12-28 LAB — LIPID PANEL
Cholesterol: 230 mg/dL — ABNORMAL HIGH (ref 0–200)
HDL: 43 mg/dL (ref >39)
LDL CALC: 138 mg/dL — AB (ref 0–99)
TRIGLYCERIDES: 243 mg/dL — AB (ref <150)
Total CHOL/HDL Ratio: 5.3 RATIO
VLDL: 49 mg/dL — ABNORMAL HIGH (ref 0–40)

## 2013-12-28 LAB — HEPARIN LEVEL (UNFRACTIONATED)
Heparin Unfractionated: 0.25 IU/mL — ABNORMAL LOW (ref 0.30–0.70)
Heparin Unfractionated: 0.42 IU/mL (ref 0.30–0.70)

## 2013-12-28 LAB — POCT ACTIVATED CLOTTING TIME: ACTIVATED CLOTTING TIME: 664 s

## 2013-12-28 SURGERY — LEFT HEART CATHETERIZATION WITH CORONARY/GRAFT ANGIOGRAM
Anesthesia: LOCAL

## 2013-12-28 MED ORDER — ATORVASTATIN CALCIUM 80 MG PO TABS
80.0000 mg | ORAL_TABLET | Freq: Every day | ORAL | Status: DC
  Filled 2013-12-28: qty 1

## 2013-12-28 MED ORDER — TICAGRELOR 90 MG PO TABS
90.0000 mg | ORAL_TABLET | Freq: Two times a day (BID) | ORAL | Status: DC
  Administered 2013-12-29: 09:00:00 90 mg via ORAL
  Filled 2013-12-28 (×2): qty 1

## 2013-12-28 MED ORDER — HYDROCODONE-ACETAMINOPHEN 5-325 MG PO TABS: 1.0000 | ORAL_TABLET | ORAL | Status: DC | PRN

## 2013-12-28 MED ORDER — SODIUM CHLORIDE 0.9 % IV SOLN: INTRAVENOUS | Status: AC

## 2013-12-28 MED ORDER — ACETAMINOPHEN 325 MG PO TABS
650.0000 mg | ORAL_TABLET | ORAL | Status: DC | PRN
  Administered 2013-12-28: 650 mg via ORAL
  Filled 2013-12-28: qty 2

## 2013-12-28 MED ORDER — SODIUM CHLORIDE 0.9 % IV SOLN
1.7500 mg/kg/h | Freq: Once | INTRAVENOUS | Status: DC
  Filled 2013-12-28: qty 250

## 2013-12-28 MED FILL — Heparin Sodium (Porcine) 100 Unt/ML in Sodium Chloride 0.45%: INTRAMUSCULAR | Qty: 250 | Status: AC

## 2013-12-28 NOTE — CV Procedure (Signed)
Cardiac Catheterization Operative Report  Ricardo Jimenez [id number] 3/9/20155:57 PM Gar Ponto, MD  Procedure Performed:  1. PTCA/DES x 1 distal body of SVG to OM1 2. PTCA with balloon angioplasty of mid AV groove Circumflex through the SVG into OM1   Operator: Lauree Chandler, MD  Indication: 63 yo male with CAD s/p CABG with unstable angina. Diagnostic cath per Dr. Haroldine Laws with severe disease distal body of SVG to OM1. This graft retrograde fills the mid AV groove Circumflex and two small distal OM branches.                                        Procedure Details: The risks, benefits, complications, treatment options, and expected outcomes were discussed with the patient. The patient and/or family concurred with the proposed plan, giving informed consent. When I entered the case, there was a 5 [place] sheath present in the right femoral artery. I upsized the sheath to a 6 [place] system and then gave a bolus of Angiomax followed by a drip. He was given Brilinta 180 mg po x 1. An AL-1 guiding catheter was used to engage the SVG to the OM. When the ACT was over 200, I passed a Cougar IC wire down the SVG into the first OM branch. I could not distally protect the vessel due to the distal location of the lesion in the body of the vein graft. A 2.5 x 12 mm balloon was used to post-dilate the stenosis. A 3.5 x 18 mm Xience DES was deployed in the distal body of the vein graft. After stent deployment, there was excellent flow into the OM1 but no re-flow into the AV groove segment that had filled retrograde from the OM through the SVG. I attempted to restore flow by using IC NTG and IC Verapamil and this was felt to be a "no re-flow " phenomenon but I could not re-establish flow with medications alone. I then passed a wire into the AV groove circumflex through the SVG. I could not deliver a balloon into the AV groove Circumflex. I then placed a GuideLiner into the mid body of the SVG by  using a 2.0 x 15 mm balloon to guide this catheter down the graft. With this in place, I was able to pass a 2.0 x 15 mm balloon down the SVG and retrograde back into the AV groove Circumflex. The balloon was inflated 5 times in the mid AV groove Circumflex. Flow was restored into the AV groove Circumflex and into OM2 however I could not restore flow into OM3. The patient had no chest pain. Final angiography demonstrated excellent flow down the SVG into OM1, excellent flow retrograde from OM1 into the AV groove Circumflex and filling the distal OM branch.   There were no immediate complications. The patient was taken to the recovery area in stable condition.   Impression: 1. Unstable angina/NSTEMI secondary to severe stenosis distal body of SVG into OM, now s/p successful PTCA/DES x 1 distal body of SVG to OM1 2. Severe disease in the mid AV groove Circumflex that is supplied by retrograde flow from the vein graft and through the OM branch, now s/p successful PTCA/balloon angioplasty only of the mid AV groove Circumflex.   Recommendations: He will need ASA and Brilinta for at least one year. Continue other cardiac medications. Discharge home in am if stable.  Complications:  None; patient tolerated the procedure well.

## 2013-12-28 NOTE — CV Procedure (Signed)
Cardiac Cath Procedure Note:  Indication: NSTEMI  Procedures performed:  1) Selective coronary angiography 2) SVG angiography 3) LIMA angiography 4) Left heart catheterization 5) Left ventriculogram  Description of procedure:   The risks and indication of the procedure were explained. Consent was signed and placed on the chart. An appropriate timeout was taken prior to the procedure. The right groin was prepped and draped in the routine sterile fashion and anesthetized with 1% local lidocaine.   A 5 FR arterial sheath was placed in the right femoral artery using a modified Seldinger technique. Standard catheters including a JL4, JR4 and angled pigtail were used. All catheter exchanges were made over a wire.  Complications:  None apparent  Findings:  Ao Pressure: 92/58 (72) LV Pressure: 102/6/16 There was no signficant gradient across the aortic valve on pullback.  Left main: Mild distal tapering  LAD: Totally occluded proximally  LCX: Totally occluded proximally  RCA: Non-dominant vessel. No significant stenosis  LIMA - LAD: Widely patent   SVG-Diagonal: Diffuse 40% through midsection. Otherwise patent  SVG - OM1: Mild plaque throughout with 99%b stenosis distally. Graft back fills OM-1, mid AV groove LCX and OM2 and OM3. Moderate plaque through mid AV groove LCX  SVG - OM3: Occluded ostially   LV-gram done in the RAO projection: Ejection fraction = 55%. No regional wall motion abnormalities.   Assessment: 1. Severe 2v CAD 2. Graft anatomy as above with occlude SVG-OM3 and 99% lesion in SVG-OM1  Plan/Discussion:  Culprit lesion is lesion in SVG-OM1. Plan PCI.   Josie Burleigh 4:42 PM

## 2013-12-28 NOTE — Progress Notes (Signed)
ANTICOAGULATION CONSULT NOTE - Follow Up Consult  Pharmacy Consult for Heparin  Indication: chest pain/ACS  No Known Allergies  Patient Measurements: Height: 5\' 10"  (177.8 cm) Weight: 235 lb 14.3 oz (107 kg) IBW/kg (Calculated) : 73 Heparin Dosing Weight: 96 kg  Vital Signs: Temp: 97.7 F (36.5 C) (03/09 0500) BP: 144/83 mmHg (03/09 0500) Pulse Rate: 66 (03/09 0500)  Labs:  Recent Labs  12/27/13 1245 12/27/13 1858 12/27/13 2145 12/28/13 0255 12/28/13 1225  HGB  --   --   --  14.2  --   HCT  --   --   --  40.8  --   PLT  --   --   --  164  --   HEPARINUNFRC  --   --  0.37 0.25* 0.42  CREATININE  --   --   --  1.08  --   TROPONINI 0.37* <0.30  --  <0.30  --     Estimated Creatinine Clearance: 85.8 ml/min (by C-G formula based on Cr of 1.08).   Medications: Heparin @ 1700 units/hr  Assessment: 63 y/o M on heparin for CP, plan is for cath today. Has required rate increases d/t subtherapeutic levels. Most recent heparin level therapeutic at 0.42units/mL. CBC is stable and no bleeding noted.  Goal of Therapy:  Heparin level 0.3-0.7 units/ml Monitor platelets by anticoagulation protocol: Yes   Plan:  1. Continue heparin at 1700 units/hr 2. Daily CBC/HL- will not order confirmatory level for this evening as plan is for cath today 3. Monitor for bleeding  Nicky Kras D. Charnise Lovan, PharmD, BCPS Clinical Pharmacist Pager: [phone] 12/28/2013 2:10 PM

## 2013-12-28 NOTE — H&P (View-Only) (Signed)
  Patient: Ricardo Jimenez / Admit Date: 12/27/2013 / Date of Encounter: 12/28/2013, 10:32 AM  Subjective  Had mild recurrence of CP overnight. No CP or SOB this AM.  Objective   Telemetry: NSR  Physical Exam: Blood pressure 144/83, pulse 66, temperature 97.7 F (36.5 C), temperature source Oral, resp. rate 16, height 5' 10" (1.778 m), weight 235 lb 14.3 oz (107 kg), SpO2 97.00%. General: Well developed, well nourished WM, in no acute distress. Head: Normocephalic, atraumatic, sclera non-icteric, no xanthomas, nares are without discharge. Neck: Negative for carotid bruits. JVP not elevated. Lungs: Clear bilaterally to auscultation without wheezes, rales, or rhonchi. Breathing is unlabored. Heart: RRR S1 S2 without murmurs, rubs, or gallops.  Abdomen: Soft, non-tender, non-distended with normoactive bowel sounds. No rebound/guarding. Extremities: No clubbing or cyanosis. No edema. Distal pedal pulses are 2+ and equal bilaterally. Neuro: Alert and oriented X 3. Moves all extremities spontaneously. Psych:  Responds to questions appropriately with a normal affect.  No intake or output data in the 24 hours ending 12/28/13 1032  Inpatient Medications:  . aspirin EC  81 mg Oral Daily  . famotidine  20 mg Oral Daily  . gabapentin  300 mg Oral q morning - 10a  . gabapentin  600 mg Oral QHS  . isosorbide mononitrate  30 mg Oral Daily  . metoprolol tartrate  25 mg Oral BID  . multivitamin with minerals  1 tablet Oral Daily  . potassium chloride  10 mEq Oral Daily  . simvastatin  20 mg Oral QPM  . sodium chloride  3 mL Intravenous Q12H  . sodium chloride  3 mL Intravenous Q12H   Infusions:  . sodium chloride 1 mL/kg/hr (12/28/13 0746)  . heparin 1,700 Units/hr (12/28/13 0445)    Labs:  Recent Labs  12/28/13 0255  NA 142  K 4.9  CL 106  CO2 24  GLUCOSE 99  BUN 17  CREATININE 1.08  CALCIUM 9.0    Recent Labs  12/28/13 0255  AST 25  ALT 18  ALKPHOS 55  BILITOT 0.4  PROT 6.7   ALBUMIN 3.4*    Recent Labs  12/28/13 0255  WBC 6.6  HGB 14.2  HCT 40.8  MCV 91.1  PLT 164    Recent Labs  12/27/13 1245 12/27/13 1858 12/28/13 0255  TROPONINI 0.37* <0.30 <0.30   Radiology/Studies:  CXR at [facility] read as CHF but BNP was low and patient was not felt to require diuresis   Assessment and Plan  1. NSTEMI/CAD: Pt has been having exertional angina for the past 7-10 days and over the past several nights has also been having rest/nocturnal angina. Last night's episode was longer than prior episodes thus prompting his presentation to the ED where his initial ECG showed lateral ischemia, which improved on f/u. Troponin was mildly elevated at [facility], first set here 0.37. Subsequent sets negative. Cont IV heparin and home doses of bb, statin, asa, and oral nitrate. Cath today as planned.  2. HTN: Stable. Cont BB. 3. HL: escalate statin. LDL uncontrolled -138 on Zocor. F/u lipids/LFTs in 6 weeks.  Signed, Kaylib Furness PA-C  

## 2013-12-28 NOTE — H&P (View-Only) (Signed)
Pt. Seen and examined. Agree with the NP/PA-C note as written.  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 NSTEMI by troponin. Plan for LHC with possible PCI today.  Pixie Casino, MD, [facility] Attending Cardiologist [place]

## 2013-12-28 NOTE — Interval H&P Note (Signed)
Cath Lab Visit (complete for each Cath Lab visit)  Clinical Evaluation Leading to the Procedure:   ACS: yes  Non-ACS:    Anginal Classification: CCS IV  Anti-ischemic medical therapy: Maximal Therapy (2 or more classes of medications)  Non-Invasive Test Results: No non-invasive testing performed  Prior CABG: Previous CABG      History and Physical Interval Note:  12/28/2013 4:12 PM  Ricardo Jimenez  has presented today for surgery, with the diagnosis of cp  The various methods of treatment have been discussed with the patient and family. After consideration of risks, benefits and other options for treatment, the patient has consented to  Procedure(s): LEFT HEART CATHETERIZATION WITH CORONARY/GRAFT ANGIOGRAM (N/A) and possible angioplasty as a surgical intervention .  The patient's history has been reviewed, patient examined, no change in status, stable for surgery.  I have reviewed the patient's chart and labs.  Questions were answered to the patient's satisfaction.     Naksh Radi

## 2013-12-28 NOTE — Progress Notes (Signed)
ANTICOAGULATION CONSULT NOTE - Follow Up Consult  Pharmacy Consult for Heparin  Indication: chest pain/ACS  No Known Allergies  Patient Measurements: Height: 5\' 10"  (177.8 cm) Weight: 235 lb 14.3 oz (107 kg) IBW/kg (Calculated) : 73 Heparin Dosing Weight: 96 kg  Vital Signs: Temp: 98.5 F (36.9 C) (03/08 2100) BP: 110/63 mmHg (03/08 2137) Pulse Rate: 65 (03/08 2137)  Labs:  Recent Labs  12/27/13 1235 12/27/13 1245 12/27/13 1858 12/27/13 2145 12/28/13 0255  HGB  --   --   --   --  14.2  HCT  --   --   --   --  40.8  PLT  --   --   --   --  164  HEPARINUNFRC <0.10*  --   --  0.37 0.25*  TROPONINI  --  0.37* <0.30  --   --     Estimated Creatinine Clearance: 77.2 ml/min (by C-G formula based on Cr of 1.2).   Medications: Heparin 1500 units/hr  Assessment: 63 y/o M on heparin for CP. HL is 0.25. Other labs as above.   Goal of Therapy:  Heparin level 0.3-0.7 units/ml Monitor platelets by anticoagulation protocol: Yes   Plan:  -Increase heparin to 1700 units/hr -1200 HL -Daily CBC/HL -Monitor for bleeding  Narda Bonds 12/28/2013,3:36 AM

## 2013-12-28 NOTE — Progress Notes (Signed)
  Patient: Ricardo Jimenez / Admit Date: 12/27/2013 / Date of Encounter: 12/28/2013, 10:32 AM  Subjective  Had mild recurrence of CP overnight. No CP or SOB this AM.  Objective   Telemetry: NSR  Physical Exam: Blood pressure 144/83, pulse 66, temperature 97.7 F (36.5 C), temperature source Oral, resp. rate 16, height 5\' 10"  (1.778 m), weight 235 lb 14.3 oz (107 kg), SpO2 97.00%. General: Well developed, well nourished WM, in no acute distress. Head: Normocephalic, atraumatic, sclera non-icteric, no xanthomas, nares are without discharge. Neck: Negative for carotid bruits. JVP not elevated. Lungs: Clear bilaterally to auscultation without wheezes, rales, or rhonchi. Breathing is unlabored. Heart: RRR S1 S2 without murmurs, rubs, or gallops.  Abdomen: Soft, non-tender, non-distended with normoactive bowel sounds. No rebound/guarding. Extremities: No clubbing or cyanosis. No edema. Distal pedal pulses are 2+ and equal bilaterally. Neuro: Alert and oriented X 3. Moves all extremities spontaneously. Psych:  Responds to questions appropriately with a normal affect.  No intake or output data in the 24 hours ending 12/28/13 1032  Inpatient Medications:  . aspirin EC  81 mg Oral Daily  . famotidine  20 mg Oral Daily  . gabapentin  300 mg Oral q morning - 10a  . gabapentin  600 mg Oral QHS  . isosorbide mononitrate  30 mg Oral Daily  . metoprolol tartrate  25 mg Oral BID  . multivitamin with minerals  1 tablet Oral Daily  . potassium chloride  10 mEq Oral Daily  . simvastatin  20 mg Oral QPM  . sodium chloride  3 mL Intravenous Q12H  . sodium chloride  3 mL Intravenous Q12H   Infusions:  . sodium chloride 1 mL/kg/hr (12/28/13 0746)  . heparin 1,700 Units/hr (12/28/13 0445)    Labs:  Recent Labs  12/28/13 0255  NA 142  K 4.9  CL 106  CO2 24  GLUCOSE 99  BUN 17  CREATININE 1.08  CALCIUM 9.0    Recent Labs  12/28/13 0255  AST 25  ALT 18  ALKPHOS 55  BILITOT 0.4  PROT 6.7   ALBUMIN 3.4*    Recent Labs  12/28/13 0255  WBC 6.6  HGB 14.2  HCT 40.8  MCV 91.1  PLT 164    Recent Labs  12/27/13 1245 12/27/13 1858 12/28/13 0255  TROPONINI 0.37* <0.30 <0.30   Radiology/Studies:  CXR at [facility] read as CHF but BNP was low and patient was not felt to require diuresis   Assessment and Plan  1. NSTEMI/CAD: Pt has been having exertional angina for the past 7-10 days and over the past several nights has also been having rest/nocturnal angina. Last night's episode was longer than prior episodes thus prompting his presentation to the ED where his initial ECG showed lateral ischemia, which improved on f/u. Troponin was mildly elevated at [facility], first set here 0.37. Subsequent sets negative. Cont IV heparin and home doses of bb, statin, asa, and oral nitrate. Cath today as planned.  2. HTN: Stable. Cont BB. 3. HL: escalate statin. LDL uncontrolled -138 on Zocor. F/u lipids/LFTs in 6 weeks.  Signed, Melina Copa PA-C

## 2013-12-28 NOTE — Progress Notes (Signed)
Pt. Seen and examined. Agree with the NP/PA-C note as written.  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 NSTEMI by troponin. Plan for LHC with possible PCI today.  Glorimar Stroope C. Tayvien Kane, MD, FACC Attending Cardiologist [facility]   

## 2013-12-29 ENCOUNTER — Encounter (HOSPITAL_COMMUNITY)
Admission: AD | Disposition: A | Discharge status: Home / Self Care | Payer: Self-pay | Source: Other Acute Inpatient Hospital | Attending: Internal Medicine

## 2013-12-29 ENCOUNTER — Ambulatory Visit (HOSPITAL_COMMUNITY)
Admission: RE | Admit: 2013-12-29 | Payer: PRIVATE HEALTH INSURANCE | Source: Ambulatory Visit | Admitting: Interventional Cardiology

## 2013-12-29 ENCOUNTER — Encounter (HOSPITAL_COMMUNITY): Payer: Self-pay | Admitting: Nurse Practitioner

## 2013-12-29 DIAGNOSIS — E782 Mixed hyperlipidemia: Secondary | ICD-10-CM

## 2013-12-29 DIAGNOSIS — I10 Essential (primary) hypertension: Secondary | ICD-10-CM

## 2013-12-29 DIAGNOSIS — I2510 Atherosclerotic heart disease of native coronary artery without angina pectoris: Secondary | ICD-10-CM

## 2013-12-29 LAB — BASIC METABOLIC PANEL
BUN: 16 mg/dL (ref 6–23)
CHLORIDE: 104 meq/L (ref 96–112)
CO2: 20 meq/L (ref 19–32)
CREATININE: 1.03 mg/dL (ref 0.50–1.35)
Calcium: 9.2 mg/dL (ref 8.4–10.5)
GFR calc Af Amer: 87 mL/min — ABNORMAL LOW (ref >90)
GFR calc non Af Amer: 75 mL/min — ABNORMAL LOW (ref >90)
Glucose, Bld: 90 mg/dL (ref 70–99)
POTASSIUM: 5.2 meq/L (ref 3.7–5.3)
Sodium: 141 mEq/L (ref 137–147)

## 2013-12-29 LAB — CBC
HCT: 42.8 % (ref 39.0–52.0)
HEMOGLOBIN: 14.8 g/dL (ref 13.0–17.0)
MCH: 31.5 pg (ref 26.0–34.0)
MCHC: 34.6 g/dL (ref 30.0–36.0)
MCV: 91.1 fL (ref 78.0–100.0)
Platelets: 163 10*3/uL (ref 150–400)
RBC: 4.7 MIL/uL (ref 4.22–5.81)
RDW: 13.2 % (ref 11.5–15.5)
WBC: 7.7 10*3/uL (ref 4.0–10.5)

## 2013-12-29 SURGERY — LEFT HEART CATHETERIZATION WITH CORONARY/GRAFT ANGIOGRAM
Anesthesia: LOCAL

## 2013-12-29 MED ORDER — TICAGRELOR 90 MG PO TABS: 90.0000 mg | ORAL_TABLET | Freq: Two times a day (BID) | ORAL | Status: DC

## 2013-12-29 MED ORDER — ASPIRIN 81 MG PO TBEC: 81.0000 mg | DELAYED_RELEASE_TABLET | Freq: Every day | ORAL | Status: DC

## 2013-12-29 MED ORDER — ATORVASTATIN CALCIUM 80 MG PO TABS: 80.0000 mg | ORAL_TABLET | Freq: Every day | ORAL | Status: DC

## 2013-12-29 MED FILL — Midazolam HCl Inj 2 MG/2ML (Base Equivalent): INTRAMUSCULAR | Qty: 2 | Status: AC

## 2013-12-29 MED FILL — Bivalirudin Trifluoroacetate For IV Soln 250 MG (Base Equiv): INTRAVENOUS | Qty: 250 | Status: AC

## 2013-12-29 MED FILL — Ticagrelor Tab 90 MG: ORAL | Qty: 1 | Status: AC

## 2013-12-29 MED FILL — Lidocaine HCl Local Preservative Free (PF) Inj 1%: INTRAMUSCULAR | Qty: 30 | Status: AC

## 2013-12-29 MED FILL — Nitroglycerin IV Soln 200 MCG/ML in D5W: INTRAVENOUS | Qty: 1 | Status: AC

## 2013-12-29 MED FILL — Verapamil HCl IV Soln 2.5 MG/ML: INTRAVENOUS | Qty: 2 | Status: AC

## 2013-12-29 MED FILL — Sodium Chloride IV Soln 0.9%: INTRAVENOUS | Qty: 50 | Status: AC

## 2013-12-29 MED FILL — Heparin Sodium (Porcine) 2 Unit/ML in Sodium Chloride 0.9%: INTRAMUSCULAR | Qty: 1000 | Status: AC

## 2013-12-29 MED FILL — Fentanyl Citrate Inj 0.05 MG/ML: INTRAMUSCULAR | Qty: 2 | Status: AC

## 2013-12-29 NOTE — Care Management Note (Signed)
    Page 1 of 1   12/29/2013     10:37:42 AM   CARE MANAGEMENT NOTE 12/29/2013  Patient:  Ricardo Jimenez,Ricardo Jimenez   Account Number:  [id number]  Date Initiated:  12/29/2013  Documentation initiated by:  [facility]  Subjective/Objective Assessment:   63 yo male  NSTEMI/CAD:  Pt has been having exertional angina for the past 7-10 days and over the past several nights has also been having rest/nocturnal angina.//Home with spouse.     Action/Plan:   Procedure(s):LEFT HEART CATHETERIZATION WITH CORONARY/GRAFT ANGIOGRAM (N/A) and possible angioplasty as a surgical intervention.//Benefits check for Brilinta   Anticipated DC Date:  12/29/2013   Anticipated DC Plan:  [place]  CM consult      Choice offered to / List presented to:             Status of service:  Completed, signed off Medicare Important Message given?   (If response is "NO", the following Medicare IM given date fields will be blank) Date Medicare IM given:   Date Additional Medicare IM given:    Discharge Disposition:    Per UR Regulation:  Reviewed for med. necessity/level of care/duration of stay  If discussed at [place] of Stay Meetings, dates discussed:    Comments:  12/29/13 [place], RN, BSN, NCM [phone] Spoke with pt at bedside regarding benefits check for Brilinta 90mg  BID.  Pt has brochure with 30 day free card and refill assistance card intact.  Pt utilizes [facility] in [place] for prescription needs.  NCM called pharmacy to confirm availability of medication. Information relayed to pt.  Pt verbalizes importance of filling medication upon discharge.

## 2013-12-29 NOTE — Progress Notes (Signed)
CARDIAC REHAB PHASE I   PRE:  Rate/Rhythm: 83 SR  BP:  Supine: 136/75  Sitting:   Standing:    SaO2:   MODE:  Ambulation: 1000 ft   POST:  Rate/Rhythm: 93 SR  BP:  Supine:   Sitting: 138/80  Standing:    SaO2:  3664-4034 Pt tolerated ambulation well without c/o of cp or SOB. VS stable Completed MI and stent education with pt and wife. They voice understanding. Pt agrees to [facility] in [place], will send referral.  [name] RN 12/29/2013 9:09 AM

## 2013-12-29 NOTE — Progress Notes (Signed)
Patient Name: Ricardo Jimenez Date of Encounter: 12/29/2013   Principal Problem:   NSTEMI (non-ST elevated myocardial infarction) Active Problems:   CAD (coronary artery disease)   Essential hypertension, benign   Mixed hyperlipidemia   SUBJECTIVE  No chest pain or sob overnight.  S/P PCI/DES to the VG->OM and PTCA of AV groove LCX.  CURRENT MEDS . aspirin EC  81 mg Oral Daily  . atorvastatin  80 mg Oral q1800  . famotidine  20 mg Oral Daily  . gabapentin  300 mg Oral q morning - 10a  . gabapentin  600 mg Oral QHS  . isosorbide mononitrate  30 mg Oral Daily  . metoprolol tartrate  25 mg Oral BID  . multivitamin with minerals  1 tablet Oral Daily  . potassium chloride  10 mEq Oral Daily  . sodium chloride  3 mL Intravenous Q12H  . Ticagrelor  90 mg Oral BID   OBJECTIVE  Filed Vitals:   12/28/13 2100 12/28/13 2354 12/29/13 0429 12/29/13 0431  BP: 95/65 109/65 120/85   Pulse: 63 65 75   Temp:  97.6 F (36.4 C) 98.2 F (36.8 C)   TempSrc:  Oral Oral   Resp:  18 17   Height:      Weight:  231 lb 14.8 oz (105.2 kg)  231 lb 14.8 oz (105.2 kg)  SpO2: 99% 97% 96%     Intake/Output Summary (Last 24 hours) at 12/29/13 0734 Last data filed at 12/29/13 0710  Gross per 24 hour  Intake 541.25 ml  Output   1800 ml  Net -1258.75 ml   Filed Weights   12/28/13 0500 12/28/13 2354 12/29/13 0431  Weight: 233 lb 11 oz (106 kg) 231 lb 14.8 oz (105.2 kg) 231 lb 14.8 oz (105.2 kg)   PHYSICAL EXAM  General: Pleasant, NAD. Neuro: Alert and oriented X 3. Moves all extremities spontaneously. Psych: Normal affect. HEENT:  Normal  Neck: Supple without bruits or JVD. Lungs:  Resp regular and unlabored, CTA. Heart: RRR no s3, s4, soft systolic murmur @ bilat USB. Abdomen: Soft, non-tender, non-distended, BS + x 4.  Extremities: No clubbing, cyanosis or edema. DP/PT/Radials 1+ and equal bilaterally.  R groin w/o bleeding/bruit/hematoma.  Accessory Clinical Findings  CBC  Recent  Labs  12/28/13 0255  WBC 6.6  HGB 14.2  HCT 40.8  MCV 91.1  PLT 324   Basic Metabolic Panel  Recent Labs  12/28/13 0255  NA 142  K 4.9  CL 106  CO2 24  GLUCOSE 99  BUN 17  CREATININE 1.08  CALCIUM 9.0   Liver Function Tests  Recent Labs  12/28/13 0255  AST 25  ALT 18  ALKPHOS 55  BILITOT 0.4  PROT 6.7  ALBUMIN 3.4*   Cardiac Enzymes  Recent Labs  12/27/13 1245 12/27/13 1858 12/28/13 0255  TROPONINI 0.37* <0.30 <0.30   Fasting Lipid Panel  Recent Labs  12/28/13 0255  CHOL 230*  HDL 43  LDLCALC 138*  TRIG 243*  CHOLHDL 5.3   TELE  Rsr, pvc's/ivr  ECG  Rsr, 72, no acute st/t changes.  Radiology/Studies  No results found.  ASSESSMENT AND PLAN  1.  NSTEMI/CAD:  S/p cath/PCI and DES to the VG->OM with PTCA of the AV groove LCX.  No chest pain overnight.  Cont ASA, brilinta, bb, statin, nitrate.  Cardiac rehab to see this AM.  2.  HTN:  Stable.  3.  HL:  LDL 43.  Cont statin.  4.  Dispo:  Ambulate.  D/C today if stable.  F/U in 7 days.  Signed, Murray Hodgkins NP  I have seen and evaluated the patient this AM along with Ignacia Bayley, NP. I agree with his findings, examination as well as impression recommendations.  Admitted with NSTEMI (h/o CABG) -- PCI of SVG-OM by Dr. Estevan Ryder & Bensimhon.  Doing well this AM. No further angina or dyspnea.  Groin CDI.    Pending ambulation & labs results, anticipate d/c today --f/u with Dr. Johnsie Cancel.   Leonie Man, M.D., M.S. Interventional Cardiologist  [place] Pager # [phone] 12/29/2013

## 2013-12-29 NOTE — Discharge Summary (Signed)
Admitted with NSTEMI - cath with severe disease in SVG-OM --> PCI with DES to SVG.  Stable on exam this AM.  Ready for d/c.  I agree with summary.  Leonie Man, MD

## 2013-12-29 NOTE — Discharge Instructions (Signed)
**  PLEASE REMEMBER TO BRING ALL OF YOUR MEDICATIONS TO EACH OF YOUR FOLLOW-UP OFFICE VISITS. ° °NO HEAVY LIFTING X 2 WEEKS. °NO SEXUAL ACTIVITY X 2 WEEKS. °NO DRIVING X 1 WEEK. °NO SOAKING BATHS, HOT TUBS, POOLS, ETC., X 7 DAYS. ° °Radial Site Care °Refer to this sheet in the next few weeks. These instructions provide you with information on caring for yourself after your procedure. Your caregiver may also give you more specific instructions. Your treatment has been planned according to current medical practices, but problems sometimes occur. Call your caregiver if you have any problems or questions after your procedure. °HOME CARE INSTRUCTIONS °· You may shower the day after the procedure. Remove the bandage (dressing) and gently wash the site with plain soap and water. Gently pat the site dry.  °· Do not apply powder or lotion to the site.  °· Do not submerge the affected site in water for 3 to 5 days.  °· Inspect the site at least twice daily.  °· Do not flex or bend the affected arm for 24 hours.  °· No lifting over 5 pounds (2.3 kg) for 5 days after your procedure.  ° °What to expect: °· Any bruising will usually fade within 1 to 2 weeks.  °· Blood that collects in the tissue (hematoma) may be painful to the touch. It should usually decrease in size and tenderness within 1 to 2 weeks.  °SEEK IMMEDIATE MEDICAL CARE IF: °· You have unusual pain at the radial site.  °· You have redness, warmth, swelling, or pain at the radial site.  °· You have drainage (other than a small amount of blood on the dressing).  °· You have chills.  °· You have a fever or persistent symptoms for more than 72 hours.  °· You have a fever and your symptoms suddenly get worse.  °· Your arm becomes pale, cool, tingly, or numb.  °· You have heavy bleeding from the site. Hold pressure on the site.  ° °

## 2013-12-29 NOTE — Discharge Summary (Signed)
Discharge Summary   Patient ID: Ricardo Jimenez,  MRN: [id number], DOB/AGE: 64-Apr-1952 64 y.o.  Admit date: 12/27/2013 Discharge date: 12/29/2013  Primary Care Provider: Gar Ponto Primary Cardiologist: P. Johnsie Cancel, MD   Discharge Diagnoses Principal Problem:   NSTEMI (non-ST elevated myocardial infarction)  **S/p catheterization this admission revealing native multivessel disease with 3/4 patent grafts.  The VG->OM1 was successfully treated with a DES.  Active Problems:   CAD (coronary artery disease)   Essential hypertension, benign   Mixed hyperlipidemia  Allergies No Known Allergies  Procedures  Cardiac Catheterization and Percutaneous Coronary Intervention 12/28/2013  Findings:  Ao Pressure: 92/58 (72) LV Pressure: 102/6/16 There was no signficant gradient across the aortic valve on pullback.  Left main: Mild distal tapering LAD: Totally occluded proximally LCX: Totally occluded proximally RCA: Non-dominant vessel. No significant stenosis  LIMA - LAD: Widely patent  SVG-Diagonal: Diffuse 40% through midsection. Otherwise patent  SVG - OM1: Mild plaque throughout with 99% stenosis distally. Graft back fills OM-1, mid AV groove LCX and OM2 and OM3. Moderate plaque through mid AV groove LCX              **The SVG->OM1 was successfully stented using a 3.5 x 18 mm Xience drug-eluting stent.  SVG - OM3: Occluded ostially  LV-gram done in the RAO projection: Ejection fraction = 55%. No regional wall motion abnormalities.       History of Present Illness  63 year old male with a prior history of coronary artery disease status post coronary artery bypass grafting x4 in September of 2010. He was recently seen in clinic with complaints of recurrent exertional angina and arrangements were made for outpatient cardiac catheterization. Unfortunately, Ricardo Jimenez had progression of exertional angina and also developed rest and nocturnal angina prompting him to present to  [facility] on March 7 where he was noted to have a mildly elevated troponin and lateral ST segment depression. Decision was made to transfer him to [facility] for further evaluation.  Hospital Course  Following arrival to [facility], patient had no further chest pain. Troponin was mildly elevated 0.37 though subsequent troponins were normal. He was maintained on aspirin, heparin, beta blocker, and high potency statin therapy. On March 9, he underwent diagnostic cardiac catheterization revealing native multivessel coronary artery disease and 3 of 4 patent grafts. The vein graft to the first obtuse marginal had a 99% ostial stenosis while the vein graft to third obtuse marginal was occluded ostially. Films were reviewed and decision was made to pursue percutaneous intervention upon the vein graft to the first obtuse marginal and this was successfully carried out with placement of a 3.5 x 18 mm Xience drug-eluting stent. Patient tolerated procedure well and post procedure has been ambulating without recurrent symptoms or limitations. He will be discharged home today in good condition.  Discharge Vitals Blood pressure 138/85, pulse 80, temperature 97.8 F (36.6 C), temperature source Oral, resp. rate 18, height 5\' 10"  (1.778 m), weight 231 lb 14.8 oz (105.2 kg), SpO2 96.00%.  Filed Weights   12/28/13 0500 12/28/13 2354 12/29/13 0431  Weight: 233 lb 11 oz (106 kg) 231 lb 14.8 oz (105.2 kg) 231 lb 14.8 oz (105.2 kg)   Labs  CBC  Recent Labs  12/28/13 0255 12/29/13 0844  WBC 6.6 7.7  HGB 14.2 14.8  HCT 40.8 42.8  MCV 91.1 91.1  PLT 164 [id number]   Basic Metabolic Panel  Recent Labs  12/28/13 0255 12/29/13 0618  NA 142 141  K  4.9 5.2  CL 106 104  CO2 24 20  GLUCOSE 99 90  BUN 17 16  CREATININE 1.08 1.03  CALCIUM 9.0 9.2   Liver Function Tests  Recent Labs  12/28/13 0255  AST 25  ALT 18  ALKPHOS 55  BILITOT 0.4  PROT 6.7  ALBUMIN 3.4*   Cardiac Enzymes  Recent Labs   12/27/13 1245 12/27/13 1858 12/28/13 0255  TROPONINI 0.37* <0.30 <0.30   Fasting Lipid Panel  Recent Labs  12/28/13 0255  CHOL 230*  HDL 43  LDLCALC 138*  TRIG 243*  CHOLHDL 5.3   Disposition  Pt is being discharged home today in good condition.  Follow-up Plans & Appointments      Follow-up Information   Follow up with Murray Hodgkins, NP On 01/12/2014. (10:30 AM - Dr. Kyla Balzarine Nurse Practitioner)    Specialty:  Nurse Practitioner   Contact information:   [address] [place] [address] [phone]       Follow up with Gar Ponto, MD. (as scheduled.)    Specialty:  Family Medicine   Contact information:   [address] [place] [address] [phone]      Discharge Medications    Medication List    STOP taking these medications       simvastatin 20 MG tablet  Commonly known as:  ZOCOR      TAKE these medications       acetaminophen 650 MG CR tablet  Commonly known as:  TYLENOL  Take 1,300 mg by mouth every morning.     aspirin 81 MG EC tablet  Take 1 tablet (81 mg total) by mouth daily.     atorvastatin 80 MG tablet  Commonly known as:  LIPITOR  Take 1 tablet (80 mg total) by mouth daily at 6 PM.     gabapentin 300 MG capsule  Commonly known as:  NEURONTIN  - Take 300-600 mg by mouth 2 (two) times daily. Take 300 mg in the morning and 600 mg at bedtime  -      glucosamine-chondroitin 500-400 MG tablet  Take 1 tablet by mouth 2 (two) times daily.     isosorbide mononitrate 30 MG 24 hr tablet  Commonly known as:  IMDUR  Take 1 tablet (30 mg total) by mouth daily.     metoprolol tartrate 25 MG tablet  Commonly known as:  LOPRESSOR  Take 25 mg by mouth 2 (two) times daily.     multivitamin tablet  Take 1 tablet by mouth daily.     nitroGLYCERIN 0.4 MG SL tablet  Commonly known as:  NITROSTAT  Place 1 tablet (0.4 mg total) under the tongue every 5 (five) minutes as needed for chest pain.     potassium  citrate 5 MEQ (540 MG) SR tablet  Commonly known as:  UROCIT-K  Take 5-10 mEq by mouth 2 (two) times daily. Take 2 tablets in the morning and 1 at night     ranitidine 150 MG tablet  Commonly known as:  ZANTAC  take 1 tablet by mouth twice a day     Ticagrelor 90 MG Tabs tablet  Commonly known as:  BRILINTA  Take 1 tablet (90 mg total) by mouth 2 (two) times daily.     TYLENOL PM EXTRA STRENGTH 25-500 MG Tabs  Generic drug:  diphenhydramine-acetaminophen  Take 1 tablet by mouth at bedtime as needed. For sleep       Outstanding Labs/Studies  F/U lipids/LFT's  in 6-8 wks.  Duration of Discharge Encounter   Greater than 30 minutes including physician time.  SignedMurray Hodgkins NP 12/29/2013, 10:09 AM

## 2014-01-12 ENCOUNTER — Ambulatory Visit (INDEPENDENT_AMBULATORY_CARE_PROVIDER_SITE_OTHER): Payer: No Typology Code available for payment source | Admitting: Nurse Practitioner

## 2014-01-12 ENCOUNTER — Encounter: Payer: Self-pay | Admitting: Nurse Practitioner

## 2014-01-12 VITALS — BP 110/70 | HR 64 | Ht 69.0 in | Wt 227.0 lb

## 2014-01-12 DIAGNOSIS — I25810 Atherosclerosis of coronary artery bypass graft(s) without angina pectoris: Secondary | ICD-10-CM

## 2014-01-12 DIAGNOSIS — I10 Essential (primary) hypertension: Secondary | ICD-10-CM

## 2014-01-12 DIAGNOSIS — E782 Mixed hyperlipidemia: Secondary | ICD-10-CM

## 2014-01-12 NOTE — Patient Instructions (Signed)
Your physician recommends that you continue on your current medications as directed. Please refer to the Current Medication list given to you today.  Your physician recommends that you schedule a follow-up appointment in: 3-4 MONTHS WITH DR. Johnsie Cancel  You were given a prescription today for blood work (fasting) to be done at your PCP office at your appointment in May, 2015.

## 2014-01-12 NOTE — Addendum Note (Signed)
Addended by: Rodman Key on: 01/12/2014 10:56 AM   Modules accepted: Orders

## 2014-01-12 NOTE — Progress Notes (Signed)
Patient Name: Ricardo Jimenez Date of Encounter: 01/12/2014  Primary Care Provider:  Gar Ponto, MD Primary Cardiologist:  P. Johnsie Cancel, MD   Patient Profile  63 year old male with a history of coronary artery disease status post coronary artery bypass grafting who presents today following recent non-ST segment elevation myocardial infarction and drug-eluting stent placement to the vein graft to the first obtuse marginal.  Problem List   Past Medical History  Diagnosis Date  . CAD (coronary artery disease)     a. 06/2009 Cath: 3VD, nondominant RCA;  b. 06/2009 CABG x 4: LIMA->LAD, VG->OM1, VG->OM3, VG->D1;  c. 03/2011 ETT: normal;  d. 12/2013 NSTEMI/Cath/PCI: LM nl, LAD 100p, LCX 100p, RCA nl, LIMA->LAD ok, VG->Diag 16m, VG->OM1 99d(3.5x18 Xience DES), VG->OM3 100d, EF 55%.  [name] HTN (hypertension)   . Hypercholesterolemia   . PUD (peptic ulcer disease)   . 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 GERD (gastroesophageal reflux disease)   . Osteoarthritis   . Horseshoe kidney    Past Surgical History  Procedure Laterality Date  . Other surgical history      BIL.CARPAL TUNNEL SURG.  . Rt.knee replacement    . Cardiac catheterization  CABG  . Kidney stone surgery    . Carpal tunnel release      bil  . Knee arthroscopy      rt x2, lft x3  . Coronary artery bypass graft  2010  . Hernia repair  2011    rt ing  . Total knee arthroplasty  02/11/2012    Procedure: TOTAL KNEE ARTHROPLASTY;  Surgeon: Rudean Haskell, MD;  Location: [place];  Service: Orthopedics;  Laterality: Left;  left total knee replacement    Allergies  No Known Allergies  HPI  63 year old male with prior history of coronary artery disease as outlined above.  He recently presented to [facility] with complaints of chest discomfort and troponin elevation.  He was transferred to [facility] for further evaluation of non-ST segment elevation myocardial infarction.  CPKs troponin 0.37.  He underwent diagnostic catheterization  revealing 3 of 4 grafts with a 99% ostial stenosis within the vein graft to the first obtuse marginal and total occlusion of the vein graft to the third obtuse marginal.  He then underwent successful PCI and drug-eluting stent placement within the vein graft to the first obtuse marginal.  He tolerated procedure well and was discharged home on March 10.  Since his discharge, he has had no chest pain or dyspnea.  He is looking forward to getting back to bowling.  He has been tolerating the length of well.  He denies palpitations, pnd, orthopnea, n, v, dizziness, syncope, edema, weight gain, or early satiety.  Home Medications  Prior to Admission medications   Medication Sig Start Date End Date Taking? Authorizing Provider  acetaminophen (TYLENOL) 650 MG CR tablet Take 1,300 mg by mouth every morning.    Yes Historical Provider, MD  aspirin EC 81 MG EC tablet Take 1 tablet (81 mg total) by mouth daily. 12/29/13  Yes Rogelia Mire, NP  atorvastatin (LIPITOR) 80 MG tablet Take 1 tablet (80 mg total) by mouth daily at 6 PM. 12/29/13  Yes Rogelia Mire, NP  diphenhydramine-acetaminophen (TYLENOL PM EXTRA STRENGTH) 25-500 MG TABS Take 1 tablet by mouth at bedtime as needed. For sleep   Yes Historical Provider, MD  gabapentin (NEURONTIN) 300 MG capsule Take 300-600 mg by mouth 2 (two) times daily. Take 300 mg in the morning  and 600 mg at bedtime    Yes Historical Provider, MD  glucosamine-chondroitin 500-400 MG tablet Take 1 tablet by mouth 2 (two) times daily.    Yes Historical Provider, MD  isosorbide mononitrate (IMDUR) 30 MG 24 hr tablet Take 1 tablet (30 mg total) by mouth daily. 12/24/13  Yes Josue Hector, MD  metoprolol tartrate (LOPRESSOR) 25 MG tablet Take 25 mg by mouth 2 (two) times daily.  12/15/11  Yes Historical Provider, MD  Multiple Vitamin (MULTIVITAMIN) tablet Take 1 tablet by mouth daily.     Yes Historical Provider, MD  nitroGLYCERIN (NITROSTAT) 0.4 MG SL tablet Place 1 tablet  (0.4 mg total) under the tongue every 5 (five) minutes as needed for chest pain. 12/16/12  Yes Josue Hector, MD  potassium citrate (UROCIT-K) 5 MEQ (540 MG) SR tablet Take 5-10 mEq by mouth 2 (two) times daily. Take 2 tablets in the morning and 1 at night   Yes Historical Provider, MD  ranitidine (ZANTAC) 150 MG tablet take 1 tablet by mouth twice a day 07/21/11  Yes Josue Hector, MD  Ticagrelor (BRILINTA) 90 MG TABS tablet Take 1 tablet (90 mg total) by mouth 2 (two) times daily. 12/29/13  Yes Rogelia Mire, NP    Review of Systems  As above, he has been doing well w/o chest pain, palpitations, dyspnea, pnd, orthopnea, n, v, dizziness, syncope, edema, weight gain, or early satiety. .  All other systems reviewed and are otherwise negative except as noted above.  Physical Exam  Blood pressure 110/70, pulse 64, height 5\' 9"  (1.753 m), weight 227 lb (102.967 kg).  General: Pleasant, NAD Psych: Normal affect. Neuro: Alert and oriented X 3. Moves all extremities spontaneously. HEENT: Normal  Neck: Supple without bruits or JVD. Lungs:  Resp regular and unlabored, CTA. Heart: RRR no s3, s4, or murmurs. Abdomen: Soft, non-tender, non-distended, BS + x 4.  Extremities: No clubbing, cyanosis or edema. DP/PT/Radials 2+ and equal bilaterally.  Accessory Clinical Findings  ECG - regular sinus rhythm, 64, inferior infarct, no acute ST or T changes.  Assessment & Plan  1.  Coronary artery disease: Status post recent non-ST segment elevation myocardial infarction in the setting of a total occlusion of the vein graft to the obtuse marginal 3, and severe stenosis within the vein graft to the OM1.  The vein graft to the OM1 was successfully intervened upon using a drug-eluting stent.  Patient has been doing well without any recurrence of chest pain or dyspnea.  He is tolerating aspirin, brilinta, , beta blocker, nitrates, and high potency statin therapy.  2.  Hypertension: Stable.  3.   Hyperlipidemia:  LDL was 138 while hospitalized earlier in March.at that time his statin was changed to Lipitor 80 mg.  He will require followup lipids and LFTs in another 6 weeks.  4.  Disposition: Followup with Dr. Johnsie Cancel in 3-4 months.    Murray Hodgkins, NP 01/12/2014, 10:46 AM

## 2014-01-26 ENCOUNTER — Telehealth: Payer: Self-pay | Admitting: *Deleted

## 2014-01-26 NOTE — Telephone Encounter (Signed)
[place] approved [place], [place] # [id number], dates 01/26/2014--01/26/2015

## 2014-01-28 ENCOUNTER — Telehealth: Payer: Self-pay

## 2014-01-28 ENCOUNTER — Telehealth: Payer: Self-pay | Admitting: *Deleted

## 2014-01-28 NOTE — Telephone Encounter (Signed)
Patient request tier exception for [facility], completed and faxed to [facility] [employer].

## 2014-01-28 NOTE — Telephone Encounter (Signed)
Patient called about samples for brilintia placed at desk

## 2014-01-28 NOTE — Telephone Encounter (Signed)
Patient calling wanting to know if there is another medication other than Brilinta that he can take. With insurance copay he has to pay almost $140 for a month's supply and can not afford this medication. Would like a call back with a more cost effective medication.

## 2014-02-24 ENCOUNTER — Telehealth: Payer: Self-pay | Admitting: *Deleted

## 2014-02-24 NOTE — Telephone Encounter (Signed)
Patient called for brilinta samples and to follow up on the tier exception request. I will put samples at the front desk and route to Lovett Sox to check the status of tier excetion.

## 2014-02-25 NOTE — Telephone Encounter (Signed)
Called patients [facility] regarding tier reduction for [place] states patient will have a $285.00 pay monthly for his BRILINTA due to having a large deductible plan, his deductible for year is $2500.00 and until that is paid he will not have a regular co-pay. I called patient and informed him of this, I gave him the customer service phone number. Customer service number is [phone] [facility].

## 2014-03-18 ENCOUNTER — Telehealth: Payer: Self-pay | Admitting: *Deleted

## 2014-03-18 MED ORDER — CLOPIDOGREL BISULFATE 75 MG PO TABS: 75.0000 mg | ORAL_TABLET | Freq: Every day | ORAL | Status: DC

## 2014-03-18 NOTE — Telephone Encounter (Signed)
Message copied by Fernande Boyden on Thu Mar 18, 2014  5:06 PM ------      Message from: Josue Hector      Created: Thu Mar 18, 2014  4:40 PM      Regarding: RE: patient calling about his brilinta       Yes Plavix 75 mg daily is ok  Will need blood test P2Y about 4 weeks after switching to make sure plavix is working                  ----- Message -----         From: Fernande Boyden, RN         Sent: 03/18/2014   9:54 AM           To: Josue Hector, MD, Fernande Boyden, RN      Subject: patient calling about his brilinta                       Patient states he is unable to afford the brilinta, it cost him $133.00/month, we have tried tier exception and such--it was denied, he has call pharmacy and the plavix will cost him $8.86/month, he would like to change for financial reasons. Let me know if I can help you, thanks, Lovett Sox, RN-patient care advocate.            He has 5 days of brilinta left       ------

## 2014-03-18 NOTE — Telephone Encounter (Signed)
Christine,  see Dr Mariana Arn note regarding lab test after starting Plavix, call patient when you schedule please. I will order plavix and let him know it is OK to change.

## 2014-03-26 ENCOUNTER — Telehealth: Payer: Self-pay | Admitting: Cardiovascular Disease

## 2014-03-26 NOTE — Telephone Encounter (Signed)
New Message   Pt called states that he will begin taking his plavix tonight verses brillanta. Pt requests a call back to discuss if blood work is needed, if so please put in orders.

## 2014-03-26 NOTE — Telephone Encounter (Signed)
PT  AWARE  TO   CHECK  P2Y IN  4  WEEKS   LAB  ORDER FAXED TO  [facility] TODAY  ./CY

## 2014-04-14 ENCOUNTER — Ambulatory Visit: Payer: No Typology Code available for payment source | Admitting: Cardiovascular Disease

## 2014-04-14 ENCOUNTER — Encounter: Payer: Self-pay | Admitting: Cardiovascular Disease

## 2014-04-21 ENCOUNTER — Telehealth: Payer: Self-pay | Admitting: Cardiovascular Disease

## 2014-04-21 NOTE — Telephone Encounter (Signed)
New Message:  Pt is calling with questions about a hospital test he has tomorrow. Requests a call back from the nurse

## 2014-04-21 NOTE — Telephone Encounter (Signed)
**Note De-Identified Halden Phegley Obfuscation** The pt is having a P2Y drawn tomorrow at [facility]. The pt is advised that he can eat and drink before he has lab drawn and to arrive at admitting at [place] tomorrow and they will direct him to the lab. He verbalized understanding and thanked me for my assistance.

## 2014-04-22 ENCOUNTER — Ambulatory Visit (HOSPITAL_COMMUNITY)
Admission: AD | Admit: 2014-04-22 | Discharge: 2014-04-22 | Disposition: A | Discharge status: Home / Self Care | Payer: No Typology Code available for payment source | Source: Ambulatory Visit | Attending: Cardiovascular Disease | Admitting: Cardiovascular Disease

## 2014-04-22 ENCOUNTER — Other Ambulatory Visit: Payer: Self-pay | Admitting: *Deleted

## 2014-04-22 DIAGNOSIS — I214 Non-ST elevation (NSTEMI) myocardial infarction: Secondary | ICD-10-CM

## 2014-04-22 LAB — PLATELET INHIBITION P2Y12: Platelet Function  P2Y12: 55 [PRU] — ABNORMAL LOW (ref 194–418)

## 2014-07-22 ENCOUNTER — Encounter: Payer: Self-pay | Admitting: Cardiovascular Disease

## 2014-07-22 ENCOUNTER — Ambulatory Visit (INDEPENDENT_AMBULATORY_CARE_PROVIDER_SITE_OTHER): Payer: No Typology Code available for payment source | Admitting: Cardiovascular Disease

## 2014-07-22 VITALS — BP 132/82 | HR 71 | Ht 69.0 in | Wt 221.5 lb

## 2014-07-22 DIAGNOSIS — I10 Essential (primary) hypertension: Secondary | ICD-10-CM

## 2014-07-22 DIAGNOSIS — E782 Mixed hyperlipidemia: Secondary | ICD-10-CM

## 2014-07-22 DIAGNOSIS — I25810 Atherosclerosis of coronary artery bypass graft(s) without angina pectoris: Secondary | ICD-10-CM

## 2014-07-22 NOTE — Patient Instructions (Signed)
Continue all current medications. Your physician wants you to follow up in: 6 months.  You will receive a reminder letter in the mail one-two months in advance.  If you don't receive a letter, please call our office to schedule the follow up appointment   

## 2014-07-22 NOTE — Progress Notes (Signed)
Patient ID: Ricardo Jimenez, male   DOB: [birth year], 63 y.o.   MRN: [id number]      SUBJECTIVE: The patient is a 63 year old male who I am evaluating for the first time. He has a history of coronary artery disease status post coronary artery bypass grafting who sustained a non-ST segment elevation myocardial infarction earlier this year and underwent drug-eluting stent placement to the vein graft to the first obtuse marginal. Ultimately, 3 out of 4 bypass grafts are patent. The patient denies any symptoms of chest pain, palpitations, shortness of breath, lightheadedness, dizziness, leg swelling, orthopnea, PND, and syncope. He is retired from [employer]. He enjoys bowling twice a week as well as fishing.   Review of Systems: As per "subjective", otherwise negative.  No Known Allergies  Current Outpatient Prescriptions  Medication Sig Dispense Refill  . acetaminophen (TYLENOL) 650 MG CR tablet Take 1,300 mg by mouth every morning.       [name] aspirin EC 81 MG EC tablet Take 1 tablet (81 mg total) by mouth daily.      [name] atorvastatin (LIPITOR) 80 MG tablet Take 1 tablet (80 mg total) by mouth daily at 6 PM.  30 tablet  6  . clopidogrel (PLAVIX) 75 MG tablet Take 1 tablet (75 mg total) by mouth daily with breakfast.  30 tablet  3  . diphenhydramine-acetaminophen (TYLENOL PM EXTRA STRENGTH) 25-500 MG TABS Take 1 tablet by mouth at bedtime as needed. For sleep      . gabapentin (NEURONTIN) 300 MG capsule Take 300-600 mg by mouth 2 (two) times daily. Take 300 mg in the morning and 600 mg at bedtime       . glucosamine-chondroitin 500-400 MG tablet Take 1 tablet by mouth 2 (two) times daily.       . isosorbide mononitrate (IMDUR) 30 MG 24 hr tablet Take 1 tablet (30 mg total) by mouth daily.  30 tablet  11  . metoprolol tartrate (LOPRESSOR) 25 MG tablet Take 25 mg by mouth 2 (two) times daily.       . Multiple Vitamin (MULTIVITAMIN) tablet Take 1 tablet by mouth daily.        .  nitroGLYCERIN (NITROSTAT) 0.4 MG SL tablet Place 1 tablet (0.4 mg total) under the tongue every 5 (five) minutes as needed for chest pain.  25 tablet  4  . potassium citrate (UROCIT-K) 5 MEQ (540 MG) SR tablet Take 5-10 mEq by mouth 2 (two) times daily. Take 2 tablets in the morning and 1 at night      . ranitidine (ZANTAC) 150 MG tablet take 1 tablet by mouth twice a day  60 tablet  4   No current facility-administered medications for this visit.    Past Medical History  Diagnosis Date  . CAD (coronary artery disease)     a. 06/2009 Cath: 3VD, nondominant RCA;  b. 06/2009 CABG x 4: LIMA->LAD, VG->OM1, VG->OM3, VG->D1;  c. 03/2011 ETT: normal;  d. 12/2013 NSTEMI/Cath/PCI: LM nl, LAD 100p, LCX 100p, RCA nl, LIMA->LAD ok, VG->Diag 82m, VG->OM1 99d(3.5x18 Xience DES), VG->OM3 100d, EF 55%.  [name] HTN (hypertension)   . Hypercholesterolemia   . PUD (peptic ulcer disease)   . 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 GERD (gastroesophageal reflux disease)   . Osteoarthritis   . Horseshoe kidney     Past Surgical History  Procedure Laterality Date  . Other surgical history      BIL.CARPAL TUNNEL SURG.  . Rt.knee replacement    .  Cardiac catheterization  CABG  . Kidney stone surgery    . Carpal tunnel release      bil  . Knee arthroscopy      rt x2, lft x3  . Coronary artery bypass graft  2010  . Hernia repair  2011    rt ing  . Total knee arthroplasty  02/11/2012    Procedure: TOTAL KNEE ARTHROPLASTY;  Surgeon: Rudean Haskell, MD;  Location: [place];  Service: Orthopedics;  Laterality: Left;  left total knee replacement    History   Social History  . Marital Status: Married    Spouse Name: N/A    Number of Children: N/A  . Years of Education: N/A   Occupational History  . Not on file.   Social History Main Topics  . Smoking status: Never Smoker   . Smokeless tobacco: Never Used     Comment: occ beer  . Alcohol Use: Yes     Comment: 07/22/14 Beer every now and then- AJ  . Drug Use: No  .  Sexual Activity: Not on file   Other Topics Concern  . Not on file   Social History Narrative   Married and lives in [place], [place] with his wife.  2 children.  Works as Photographer.     Filed Vitals:   07/22/14 1052 07/22/14 1057  BP: 146/96 132/82  Pulse: 71   Height: 5\' 9"  (1.753 m)   Weight: 221 lb 8 oz (100.472 kg)   SpO2: 99%     Repeat BP check: 132/82  PHYSICAL EXAM General: NAD HEENT: Normal. Neck: No JVD, no thyromegaly. Lungs: Clear to auscultation bilaterally with normal respiratory effort. CV: Nondisplaced PMI.  Regular rate and rhythm, normal S1/S2, no S3/S4, no murmur. No pretibial or periankle edema.  No carotid bruit.  Normal pedal pulses.  Abdomen: Soft, nontender, obese, no hepatosplenomegaly, no distention.  Neurologic: Alert and oriented x 3.  Psych: Normal affect. Skin: Normal. Musculoskeletal: Normal range of motion, no gross deformities. Extremities: No clubbing or cyanosis.   ECG: Most recent ECG reviewed.  Cath (12/28/13):  Left main: Mild distal tapering  LAD: Totally occluded proximally  LCX: Totally occluded proximally  RCA: Non-dominant vessel. No significant stenosis  LIMA - LAD: Widely patent  SVG-Diagonal: Diffuse 40% through midsection. Otherwise patent  SVG - OM1: Mild plaque throughout with 99%b stenosis distally. Graft back fills OM-1, mid AV groove LCX and OM2 and OM3. Moderate plaque through mid AV groove LCX  SVG - OM3: Occluded ostially  LV-gram done in the RAO projection: Ejection fraction = 55%. No regional wall motion abnormalities.   Assessment:  1. Severe 2v CAD  2. Graft anatomy as above with occlude SVG-OM3 and 99% lesion in SVG-OM1    ASSESSMENT AND PLAN: 1. CAD/CABG: Stable ischemic heart disease. Continue aspirin 81 mg, Lipitor 80 mg, Plavix 75 mg, Imdur 30 mg, and metoprolol 25 mg twice daily. 2. Essential HTN: Initially elevated but repeat check was normal. Will monitor. 3. Hyperlipidemia: Will obtain copy of  lipids from PCP's office. Continue high intensity statin therapy.  Dispo: f/u 6 months.  Kate Sable, M.D., F.A.C.C.

## 2014-07-27 ENCOUNTER — Other Ambulatory Visit: Payer: Self-pay | Admitting: *Deleted

## 2014-07-27 MED ORDER — ATORVASTATIN CALCIUM 80 MG PO TABS: 80.0000 mg | ORAL_TABLET | Freq: Every day | ORAL | Status: DC

## 2014-07-27 MED ORDER — CLOPIDOGREL BISULFATE 75 MG PO TABS: 75.0000 mg | ORAL_TABLET | Freq: Every day | ORAL | Status: DC

## 2014-08-30 ENCOUNTER — Encounter: Payer: Self-pay | Admitting: Cardiovascular Disease

## 2014-08-30 ENCOUNTER — Ambulatory Visit (INDEPENDENT_AMBULATORY_CARE_PROVIDER_SITE_OTHER): Payer: 59 | Admitting: Cardiovascular Disease

## 2014-08-30 VITALS — BP 129/82 | HR 64 | Ht 69.0 in | Wt 220.0 lb

## 2014-08-30 DIAGNOSIS — I10 Essential (primary) hypertension: Secondary | ICD-10-CM

## 2014-08-30 DIAGNOSIS — I25708 Atherosclerosis of coronary artery bypass graft(s), unspecified, with other forms of angina pectoris: Secondary | ICD-10-CM

## 2014-08-30 DIAGNOSIS — R079 Chest pain, unspecified: Secondary | ICD-10-CM

## 2014-08-30 DIAGNOSIS — E782 Mixed hyperlipidemia: Secondary | ICD-10-CM

## 2014-08-30 MED ORDER — ISOSORBIDE MONONITRATE ER 30 MG PO TB24: 30.0000 mg | ORAL_TABLET | Freq: Two times a day (BID) | ORAL | Status: DC

## 2014-08-30 NOTE — Patient Instructions (Signed)
   Increase Imdur to 30mg  twice a day  - new sent to pharm Continue all current medications. Follow up in  1 month

## 2014-08-30 NOTE — Progress Notes (Signed)
Patient ID: Ricardo Jimenez, male   DOB: [birth year], 63 y.o.   MRN: [id number]      SUBJECTIVE: The patient is here to follow-up for coronary artery disease. I saw him about a month ago and he was doing well at that time. He has a history of coronary artery disease status post coronary artery bypass grafting and sustained a non-ST segment elevation myocardial infarction earlier this year and underwent drug-eluting stent placement to the vein graft to the first obtuse marginal. Ultimately, 3 out of 4 bypass grafts were found to be patent.  He was on a cruise approximately 2 weeks ago and after a particularly strenuous day, he experienced some left-sided chest discomfort. His usual ischemic chest pain is in the retrosternal region and this was different. He takes ranitidine for GERD and denies dysphagia for solids or liquids. He then experienced chest discomfort which was alleviated with deep breaths and lying on his back. He denies associated shortness of breath and palpitations.  Review of Systems: As per "subjective", otherwise negative.  No Known Allergies  Current Outpatient Prescriptions  Medication Sig Dispense Refill  . acetaminophen (TYLENOL) 650 MG CR tablet Take 1,300 mg by mouth every morning.     [name] aspirin EC 81 MG EC tablet Take 1 tablet (81 mg total) by mouth daily.    [name] atorvastatin (LIPITOR) 80 MG tablet Take 1 tablet (80 mg total) by mouth daily at 6 PM. 30 tablet 6  . clopidogrel (PLAVIX) 75 MG tablet Take 1 tablet (75 mg total) by mouth daily with breakfast. 30 tablet 3  . diphenhydramine-acetaminophen (TYLENOL PM EXTRA STRENGTH) 25-500 MG TABS Take 1 tablet by mouth at bedtime as needed. For sleep    . gabapentin (NEURONTIN) 300 MG capsule Take 300-600 mg by mouth 2 (two) times daily. Take 300 mg in the morning and 600 mg at bedtime     . glucosamine-chondroitin 500-400 MG tablet Take 1 tablet by mouth 2 (two) times daily.     . isosorbide mononitrate (IMDUR) 30 MG 24 hr  tablet Take 1 tablet (30 mg total) by mouth daily. 30 tablet 11  . metoprolol tartrate (LOPRESSOR) 25 MG tablet Take 25 mg by mouth 2 (two) times daily.     . Multiple Vitamin (MULTIVITAMIN) tablet Take 1 tablet by mouth daily.      . nitroGLYCERIN (NITROSTAT) 0.4 MG SL tablet Place 1 tablet (0.4 mg total) under the tongue every 5 (five) minutes as needed for chest pain. 25 tablet 4  . potassium citrate (UROCIT-K) 5 MEQ (540 MG) SR tablet Take 5-10 mEq by mouth 2 (two) times daily. Take 2 tablets in the morning and 1 at night    . ranitidine (ZANTAC) 150 MG tablet take 1 tablet by mouth twice a day 60 tablet 4   No current facility-administered medications for this visit.    Past Medical History  Diagnosis Date  . CAD (coronary artery disease)     a. 06/2009 Cath: 3VD, nondominant RCA;  b. 06/2009 CABG x 4: LIMA->LAD, VG->OM1, VG->OM3, VG->D1;  c. 03/2011 ETT: normal;  d. 12/2013 NSTEMI/Cath/PCI: LM nl, LAD 100p, LCX 100p, RCA nl, LIMA->LAD ok, VG->Diag 42m, VG->OM1 99d(3.5x18 Xience DES), VG->OM3 100d, EF 55%.  [name] HTN (hypertension)   . Hypercholesterolemia   . PUD (peptic ulcer disease)   . 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 GERD (gastroesophageal reflux disease)   . Osteoarthritis   . Horseshoe kidney     Past Surgical History  Procedure Laterality Date  . Other surgical history      BIL.CARPAL TUNNEL SURG.  . Rt.knee replacement    . Cardiac catheterization  CABG  . Kidney stone surgery    . Carpal tunnel release      bil  . Knee arthroscopy      rt x2, lft x3  . Coronary artery bypass graft  2010  . Hernia repair  2011    rt ing  . Total knee arthroplasty  02/11/2012    Procedure: TOTAL KNEE ARTHROPLASTY;  Surgeon: Rudean Haskell, MD;  Location: [place];  Service: Orthopedics;  Laterality: Left;  left total knee replacement    History   Social History  . Marital Status: Married    Spouse Name: N/A    Number of Children: N/A  . Years of Education: N/A   Occupational History   . Not on file.   Social History Main Topics  . Smoking status: Never Smoker   . Smokeless tobacco: Never Used     Comment: occ beer  . Alcohol Use: Yes     Comment: 07/22/14 Beer every now and then- AJ  . Drug Use: No  . Sexual Activity: Not on file   Other Topics Concern  . Not on file   Social History Narrative   Married and lives in [place], [place] with his wife.  2 children.  Works as Photographer.     BP 129/82  Pulse 64 SpO2 97% Weight 220 lb (99.791 kg) Height 5\' 9"  (1.753 m)   PHYSICAL EXAM General: NAD HEENT: Normal. Neck: No JVD, no thyromegaly. Lungs: Clear to auscultation bilaterally with normal respiratory effort. CV: Nondisplaced PMI.  Regular rate and rhythm, normal S1/S2, no S3/S4, no murmur. No pretibial or periankle edema.  No carotid bruit.  Normal pedal pulses.  Abdomen: Soft, nontender, no hepatosplenomegaly, no distention.  Neurologic: Alert and oriented x 3.  Psych: Normal affect. Skin: Normal. Musculoskeletal: Normal range of motion, no gross deformities. Extremities: No clubbing or cyanosis.   ECG: Most recent ECG reviewed.    ASSESSMENT AND PLAN: 1. CAD/CABG: Relatively stable ischemic heart disease with somewhat atypical symptoms. Continue aspirin 81 mg, Lipitor 80 mg, Plavix 75 mg, and metoprolol 25 mg twice daily. I will increase Imdur to 30 mg bid. 2. Essential HTN: Well controlled today. No changes to therapy. 3. Hyperlipidemia: Will obtain copy of lipids from PCP's office. Continue high intensity statin therapy.  Dispo: f/u 1 month.  Kate Sable, M.D., F.A.C.C.

## 2014-09-13 ENCOUNTER — Telehealth: Payer: Self-pay | Admitting: *Deleted

## 2014-09-13 NOTE — Telephone Encounter (Signed)
Patient last seen in office on 08/30/14.  Imdur was increased to 30mg  twice a day.  Stated that has helped the pain that was in his upper left shoulder & chest area.  Had a different type of chest pain on Saturday.  Was with his grandson to the parade, had to stop several times.  Had chest pain in center of chest / tightness (7/10) that was worse with the walking.  Stated that he did take one NTG & pain was relieved.  Did not seem to have any SOB or dizziness with this episode, but was concerning because this pain was different than before.  Had another episode Sunday evening while sitting in recliner watching TV.  This was more of a discomfort instead of a pain.  (4/10).  Took one NTG & pain was relieved.  Today patient has felt fine with no problems.  Patient already has f/u scheduled for 09/23/14 at 3:40 with Dr. Bronson Ing in [place] office.  Patient questions if MD feels that he needs heart cath.  Informed patient that message will be sent to provider for advice.  In the meantime, if symptoms worsen - do not hesitate to go to ED for evaluation if he feels necessary.  Patient verbalized understanding.

## 2014-09-14 NOTE — Telephone Encounter (Signed)
I do not feel we need to proceed with coronary angiography at this time. I have reviewed his coronary angiogram results from March 2015. Would continue to manage medically.

## 2014-09-14 NOTE — Telephone Encounter (Signed)
Patient notified.  Stated that he did have to take another NTG last pm.  Bowling with [employer].  Seems to occur most often with activity.  Did not feel like stress or musculoskeletal reasons could be aggravating things.  Stated just like before, one relieved & felt fine afterwards.  Again, advised to go to ED if symptoms worsen.  Patient verbalized understanding.

## 2014-09-18 ENCOUNTER — Encounter (HOSPITAL_COMMUNITY): Payer: Self-pay | Admitting: *Deleted

## 2014-09-18 ENCOUNTER — Inpatient Hospital Stay (HOSPITAL_COMMUNITY)
Admission: AD | Admit: 2014-09-18 | Discharge: 2014-09-21 | DRG: 282 | Disposition: A | Discharge status: Home / Self Care | Payer: PRIVATE HEALTH INSURANCE | Source: Other Acute Inpatient Hospital | Attending: Cardiology | Admitting: Cardiology

## 2014-09-18 DIAGNOSIS — I10 Essential (primary) hypertension: Secondary | ICD-10-CM | POA: Diagnosis not present

## 2014-09-18 DIAGNOSIS — Z87442 Personal history of urinary calculi: Secondary | ICD-10-CM

## 2014-09-18 DIAGNOSIS — Q631 Lobulated, fused and horseshoe kidney: Secondary | ICD-10-CM

## 2014-09-18 DIAGNOSIS — E785 Hyperlipidemia, unspecified: Secondary | ICD-10-CM | POA: Diagnosis present

## 2014-09-18 DIAGNOSIS — R319 Hematuria, unspecified: Secondary | ICD-10-CM | POA: Diagnosis present

## 2014-09-18 DIAGNOSIS — K219 Gastro-esophageal reflux disease without esophagitis: Secondary | ICD-10-CM | POA: Diagnosis present

## 2014-09-18 DIAGNOSIS — Z951 Presence of aortocoronary bypass graft: Secondary | ICD-10-CM | POA: Diagnosis not present

## 2014-09-18 DIAGNOSIS — E780 Pure hypercholesterolemia: Secondary | ICD-10-CM | POA: Diagnosis present

## 2014-09-18 DIAGNOSIS — Z7982 Long term (current) use of aspirin: Secondary | ICD-10-CM | POA: Diagnosis not present

## 2014-09-18 DIAGNOSIS — I200 Unstable angina: Secondary | ICD-10-CM | POA: Diagnosis present

## 2014-09-18 DIAGNOSIS — Z8711 Personal history of peptic ulcer disease: Secondary | ICD-10-CM | POA: Diagnosis not present

## 2014-09-18 DIAGNOSIS — I2510 Atherosclerotic heart disease of native coronary artery without angina pectoris: Secondary | ICD-10-CM | POA: Diagnosis present

## 2014-09-18 DIAGNOSIS — M1990 Unspecified osteoarthritis, unspecified site: Secondary | ICD-10-CM | POA: Diagnosis present

## 2014-09-18 DIAGNOSIS — I214 Non-ST elevation (NSTEMI) myocardial infarction: Secondary | ICD-10-CM | POA: Diagnosis not present

## 2014-09-18 DIAGNOSIS — I252 Old myocardial infarction: Secondary | ICD-10-CM

## 2014-09-18 DIAGNOSIS — I209 Angina pectoris, unspecified: Secondary | ICD-10-CM | POA: Diagnosis present

## 2014-09-18 DIAGNOSIS — I25110 Atherosclerotic heart disease of native coronary artery with unstable angina pectoris: Secondary | ICD-10-CM | POA: Diagnosis present

## 2014-09-18 LAB — URINALYSIS, ROUTINE W REFLEX MICROSCOPIC
Bilirubin Urine: NEGATIVE
GLUCOSE, UA: NEGATIVE mg/dL
Ketones, ur: 15 mg/dL — AB
Nitrite: NEGATIVE
Protein, ur: 100 mg/dL — AB
Specific Gravity, Urine: 1.012 (ref 1.005–1.030)
Urobilinogen, UA: 1 mg/dL (ref 0.0–1.0)
pH: 6.5 (ref 5.0–8.0)

## 2014-09-18 LAB — CBC
HCT: 39.3 % (ref 39.0–52.0)
Hemoglobin: 13.3 g/dL (ref 13.0–17.0)
MCH: 29.9 pg (ref 26.0–34.0)
MCHC: 33.8 g/dL (ref 30.0–36.0)
MCV: 88.3 fL (ref 78.0–100.0)
PLATELETS: 172 10*3/uL (ref 150–400)
RBC: 4.45 MIL/uL (ref 4.22–5.81)
RDW: 13.1 % (ref 11.5–15.5)
WBC: 5.4 10*3/uL (ref 4.0–10.5)

## 2014-09-18 LAB — URINE MICROSCOPIC-ADD ON

## 2014-09-18 LAB — PROTIME-INR
INR: 1.05 (ref 0.00–1.49)
Prothrombin Time: 13.8 seconds (ref 11.6–15.2)

## 2014-09-18 LAB — TROPONIN I: Troponin I: <0.3 ng/mL (ref <0.30)

## 2014-09-18 LAB — CREATININE, SERUM
CREATININE: 0.86 mg/dL (ref 0.50–1.35)
GFR calc Af Amer: >90 mL/min (ref >90)
GFR, EST NON AFRICAN AMERICAN: 90 mL/min — AB (ref >90)

## 2014-09-18 MED ORDER — METOPROLOL TARTRATE 25 MG PO TABS
25.0000 mg | ORAL_TABLET | Freq: Two times a day (BID) | ORAL | Status: DC
  Administered 2014-09-18 – 2014-09-21 (×6): 25 mg via ORAL
  Filled 2014-09-18 (×7): qty 1

## 2014-09-18 MED ORDER — DIPHENHYDRAMINE-APAP (SLEEP) 25-500 MG PO TABS: 1.0000 | ORAL_TABLET | Freq: Every evening | ORAL | Status: DC | PRN

## 2014-09-18 MED ORDER — SODIUM CHLORIDE 0.9 % IV SOLN: 250.0000 mL | INTRAVENOUS | Status: DC | PRN

## 2014-09-18 MED ORDER — ONE-DAILY MULTI VITAMINS PO TABS: 1.0000 | ORAL_TABLET | Freq: Every day | ORAL | Status: DC

## 2014-09-18 MED ORDER — ATORVASTATIN CALCIUM 80 MG PO TABS
80.0000 mg | ORAL_TABLET | Freq: Every day | ORAL | Status: DC
  Administered 2014-09-18 – 2014-09-21 (×4): 80 mg via ORAL
  Filled 2014-09-18 (×4): qty 1

## 2014-09-18 MED ORDER — SODIUM CHLORIDE 0.9 % IJ SOLN: 3.0000 mL | INTRAMUSCULAR | Status: DC | PRN

## 2014-09-18 MED ORDER — NITROGLYCERIN 0.4 MG SL SUBL
0.4000 mg | SUBLINGUAL_TABLET | SUBLINGUAL | Status: DC | PRN
  Administered 2014-09-19: 0.4 mg via SUBLINGUAL
  Filled 2014-09-18: qty 1

## 2014-09-18 MED ORDER — DIPHENHYDRAMINE HCL 25 MG PO CAPS: 25.0000 mg | ORAL_CAPSULE | Freq: Every evening | ORAL | Status: DC | PRN

## 2014-09-18 MED ORDER — ADULT MULTIVITAMIN W/MINERALS CH
1.0000 | ORAL_TABLET | Freq: Every day | ORAL | Status: DC
  Administered 2014-09-18 – 2014-09-21 (×3): 1 via ORAL
  Filled 2014-09-18 (×4): qty 1

## 2014-09-18 MED ORDER — ACETAMINOPHEN 500 MG PO TABS: 500.0000 mg | ORAL_TABLET | Freq: Every evening | ORAL | Status: DC | PRN

## 2014-09-18 MED ORDER — ONDANSETRON HCL 4 MG/2ML IJ SOLN: 4.0000 mg | Freq: Four times a day (QID) | INTRAMUSCULAR | Status: DC | PRN

## 2014-09-18 MED ORDER — CLOPIDOGREL BISULFATE 75 MG PO TABS
75.0000 mg | ORAL_TABLET | Freq: Every day | ORAL | Status: DC
  Administered 2014-09-19 – 2014-09-21 (×3): 75 mg via ORAL
  Filled 2014-09-18 (×5): qty 1

## 2014-09-18 MED ORDER — HEPARIN SODIUM (PORCINE) 5000 UNIT/ML IJ SOLN
5000.0000 [IU] | Freq: Three times a day (TID) | INTRAMUSCULAR | Status: DC
Start: 2014-09-18 — End: 2014-09-19
  Administered 2014-09-18 – 2014-09-19 (×2): 5000 [IU] via SUBCUTANEOUS
  Filled 2014-09-18 (×2): qty 1

## 2014-09-18 MED ORDER — NITROGLYCERIN IN D5W 200-5 MCG/ML-% IV SOLN
3.0000 ug/min | INTRAVENOUS | Status: DC
  Administered 2014-09-18: 5 ug/min via INTRAVENOUS
  Administered 2014-09-19: 10 ug/min via INTRAVENOUS
  Filled 2014-09-18 (×2): qty 250

## 2014-09-18 MED ORDER — GABAPENTIN 600 MG PO TABS
600.0000 mg | ORAL_TABLET | Freq: Every day | ORAL | Status: DC
  Administered 2014-09-18 – 2014-09-20 (×3): 600 mg via ORAL
  Filled 2014-09-18 (×5): qty 1

## 2014-09-18 MED ORDER — ASPIRIN EC 81 MG PO TBEC
81.0000 mg | DELAYED_RELEASE_TABLET | Freq: Every day | ORAL | Status: DC
  Administered 2014-09-20 – 2014-09-21 (×2): 81 mg via ORAL
  Filled 2014-09-18 (×4): qty 1

## 2014-09-18 MED ORDER — FAMOTIDINE 20 MG PO TABS
20.0000 mg | ORAL_TABLET | Freq: Every day | ORAL | Status: DC
  Administered 2014-09-18 – 2014-09-21 (×4): 20 mg via ORAL
  Filled 2014-09-18 (×4): qty 1

## 2014-09-18 MED ORDER — ACETAMINOPHEN 325 MG PO TABS
650.0000 mg | ORAL_TABLET | ORAL | Status: DC | PRN
  Administered 2014-09-19 (×2): 650 mg via ORAL
  Filled 2014-09-18 (×2): qty 2

## 2014-09-18 MED ORDER — SODIUM CHLORIDE 0.9 % IJ SOLN: 3.0000 mL | Freq: Two times a day (BID) | INTRAMUSCULAR | Status: DC

## 2014-09-18 MED ORDER — GABAPENTIN 300 MG PO CAPS
300.0000 mg | ORAL_CAPSULE | Freq: Every day | ORAL | Status: DC
  Administered 2014-09-18 – 2014-09-21 (×4): 300 mg via ORAL
  Filled 2014-09-18 (×4): qty 1

## 2014-09-18 NOTE — Progress Notes (Signed)
Patient arrived from [facility] and upon assessment heparin drip running at 10 and nitroglycerin paste one inch on left chest.   Per verbal order d/c heparin and nitro paste.   Per order began nitroglycerin drip at 5 mcg/hr.   Patient educated on side effects of nitroglycerin and verbalized understanding.   Will continue to monitor closely.   Ricardo Jimenez

## 2014-09-18 NOTE — H&P (Signed)
Patient ID: Ricardo Jimenez MRN: [id number], DOB/AGE: 1951/01/04   Admit date: 09/18/2014  Primary Physician: Gar Ponto, MD Primary Cardiologist: Court Joy, MD    Pt. Profile:  63 y/o male with a h/o CAD s/p CABG and subsequent NSTEMI in 12/2013 s/p DES to the VG->OM1, who presents on tx from [facility] after a several day h/o exertional angina and now nocturnal angina.  Problem List  Past Medical History  Diagnosis Date  . CAD (coronary artery disease)     a. 06/2009 Cath: 3VD, nondominant RCA;  b. 06/2009 CABG x 4: LIMA->LAD, VG->OM1, VG->OM3, VG->D1;  c. 03/2011 ETT: normal;  d. 12/2013 NSTEMI/Cath/PCI: LM nl, LAD 100p, LCX 100p, RCA nl, LIMA->LAD ok, VG->Diag 31m, VG->OM1 99d(3.5x18 Xience DES), VG->OM3 100d, EF 55%.  [name] HTN (hypertension)   . Hypercholesterolemia   . PUD (peptic ulcer disease)   . 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 GERD (gastroesophageal reflux disease)   . Osteoarthritis   . Horseshoe kidney     Past Surgical History  Procedure Laterality Date  . Other surgical history      BIL.CARPAL TUNNEL SURG.  . Rt.knee replacement    . Cardiac catheterization  CABG  . Kidney stone surgery    . Carpal tunnel release      bil  . Knee arthroscopy      rt x2, lft x3  . Coronary artery bypass graft  2010  . Hernia repair  2011    rt ing  . Total knee arthroplasty  02/11/2012    Procedure: TOTAL KNEE ARTHROPLASTY;  Surgeon: Rudean Haskell, MD;  Location: [place];  Service: Orthopedics;  Laterality: Left;  left total knee replacement    Allergies  No Known Allergies  HPI  63 y/o male w/o a h/o CAD s/p CABG x 4 in 2010 and more recently NSTEMI in 12/2013 requiring DES to the VG->OM1.  The VG->OM3 was occluded @ that time while the LIMA->LAD and VG->Diag were patent.  He did well following that event but was recently seen by Dr. Bronson Ing and c/o recurrent mild exertional angina.  His imdur was increased to 30mg  BID.  Unfortunately, he continued to have  exertional chest pain and it is now occurring with minimal activity and requing prn ntg on a daily basis.  He arranged for early f/u than previously scheduled and got an appt for next week.  Last night however, he had 3-4 episodes of c/p that awoke him from sleep and this prompted him to present to the [facility] ED this AM.  There, ECG showed lateral ST depression, which was new c/o ECG in March.  Initial troponin was normal  He was transferred to [facility] for further eval.  He is currently on heparin but just used the toilet and noted hematuria.  Otw, he is currently w/o complaints.  Home Medications  Prior to Admission medications   Medication Sig Start Date End Date Taking? Authorizing Provider  acetaminophen (TYLENOL) 650 MG CR tablet Take 1,300 mg by mouth every morning.    Yes Historical Provider, MD  aspirin EC 81 MG EC tablet Take 1 tablet (81 mg total) by mouth daily. 12/29/13  Yes Rogelia Mire, NP  atorvastatin (LIPITOR) 80 MG tablet Take 1 tablet (80 mg total) by mouth daily at 6 PM. 07/27/14  Yes Herminio Commons, MD  clopidogrel (PLAVIX) 75 MG tablet Take 1 tablet (75 mg total) by mouth daily with breakfast. 07/27/14  Yes Jamesetta So  A Bronson Ing, MD  diphenhydramine-acetaminophen (TYLENOL PM EXTRA STRENGTH) 25-500 MG TABS Take 1 tablet by mouth at bedtime as needed. For sleep   Yes Historical Provider, MD  gabapentin (NEURONTIN) 300 MG capsule Take 300-600 mg by mouth 2 (two) times daily. Take 300 mg in the morning and 600 mg at bedtime    Yes Historical Provider, MD  glucosamine-chondroitin 500-400 MG tablet Take 1 tablet by mouth 2 (two) times daily.    Yes Historical Provider, MD  isosorbide mononitrate (IMDUR) 30 MG 24 hr tablet Take 1 tablet (30 mg total) by mouth 2 (two) times daily. 08/30/14  Yes Herminio Commons, MD  metoprolol tartrate (LOPRESSOR) 25 MG tablet Take 25 mg by mouth 2 (two) times daily.  12/15/11  Yes Historical Provider, MD  Multiple Vitamin (MULTIVITAMIN) tablet  Take 1 tablet by mouth daily.     Yes Historical Provider, MD  nitroGLYCERIN (NITROSTAT) 0.4 MG SL tablet Place 1 tablet (0.4 mg total) under the tongue every 5 (five) minutes as needed for chest pain. 12/16/12  Yes Josue Hector, MD  potassium citrate (UROCIT-K) 5 MEQ (540 MG) SR tablet Take 5-10 mEq by mouth 2 (two) times daily. Take 2 tablets in the morning and 1 at night   Yes Historical Provider, MD  ranitidine (ZANTAC) 150 MG tablet take 1 tablet by mouth twice a day 07/21/11  Yes Josue Hector, MD    Family History  Family History  Problem Relation Age of Onset  . COPD Father     AGE 65    Social History  History   Social History  . Marital Status: Married    Spouse Name: N/A    Number of Children: N/A  . Years of Education: N/A   Occupational History  . Not on file.   Social History Main Topics  . Smoking status: Never Smoker   . Smokeless tobacco: Never Used     Comment: occ beer  . Alcohol Use: 0.0 oz/week    0 Not specified per week     Comment: 07/22/14 Beer every now and then- AJ  . Drug Use: No  . Sexual Activity: Not on file   Other Topics Concern  . Not on file   Social History Narrative   Married and lives in [place], [place] with his wife.  2 children.  Works as Photographer.     Review of Systems General:  No chills, fever, night sweats or weight changes.  Cardiovascular:  +++ rest and exertional  chest pain w/ dyspnea on exertion as above.  No edema, orthopnea, palpitations, paroxysmal nocturnal dyspnea. Dermatological: No rash, lesions/masses Respiratory: No cough, +++ dyspnea on exertion. Urologic:  +++ hematuria since arrival here.  No dysuria Abdominal:   No nausea, vomiting, diarrhea, bright red blood per rectum, melena, or hematemesis Neurologic:  No visual changes, wkns, changes in mental status. All other systems reviewed and are otherwise negative except as noted above.  Physical Exam  Blood pressure 125/73, pulse 75, temperature 98.4  F (36.9 C), temperature source Oral, SpO2 97 %.  General: Pleasant, NAD Psych: Normal affect. Neuro: Alert and oriented X 3. Moves all extremities spontaneously. HEENT: Normal  Neck: Supple without bruits or JVD. Lungs:  Resp regular and unlabored, CTA. Heart: RRR no s3, s4, or murmurs. Abdomen: Soft, non-tender, non-distended, BS + x 4.  Extremities: No clubbing, cyanosis or edema. DP/PT/Radials 2+ and equal bilaterally.  No fem bruits.  Labs  Hemoglobin 13.3, hematocrit 39.9,  WBC 5.2, platelets 168. Sodium 135, potassium 3.4, chloride 105, CO2 26, BUN 17, creatinine 0.99, glucose 125. Magnesium 1.9. Troponin I 0.05   Radiology/Studies  Chest x-ray shows no evidence of pulmonary edema.  ECG  EKG shows sinus rhythm, 72, somewhat diffuse ST flattening with up to 1.5 mm of ST segment depression.  ASSESSMENT AND PLAN  1. Unstable angina/CAD: Patient presented on transfer from [facility] following a several week history of progressive exertional angina and now with rest angina. ECG shows diffuse ST flattening and depression while troponin was mildly elevated at 0.05. He is currently pain-free. We will continue to cycle cardiac markers and add IV nitroglycerin given unpredictable nature of angina. He is currently on heparin however in light of hematuria, we will hold this. If he has significant enzyme elevation, we'll need to readdress. Continue aspirin, Plavix, beta blocker, and high potency statin therapy. Plan on diagnostic catheterization on Monday, November 30, or sooner if necessary.  2. Hypertension: Currently stable. Continue beta blocker and nitrate. Next  3. Hyper lipidemia: Continue statin therapy. This is been followed by his primary care provider as an outpatient. His LDL was 138 in March.  4. Hematuria: This developed today while on heparin. As above, we will hold heparin for the time being but may need to readdress this if troponin rises significantly. I will  check a urinalysis.   Signed, Murray Hodgkins, NP 09/18/2014, 3:43 PM Patient seen and examined. I agree with the assessment and plan as detailed above. See also my additional thoughts below.   I have carefully reviewed all of the data and spoken with the patient. I am quite concerned about his symptoms. In addition, it is unfortunate that he is now developed hematuria since his heparin was started today. He is stable at this time. We will turn off his heparin. I will be speaking with the team covering tonight. If he develops significant symptoms, heparin will have to be restarted. If he does not respond rapidly to this, consideration will have to be given to proceeding to the cath lab.  [name], MD, [facility] 09/18/2014 4:57 PM

## 2014-09-18 NOTE — Plan of Care (Signed)
Problem: Phase I Progression Outcomes Goal: Aspirin unless contraindicated Outcome: Progressing

## 2014-09-19 ENCOUNTER — Encounter (HOSPITAL_COMMUNITY)
Admission: AD | Disposition: A | Discharge status: Home / Self Care | Payer: Self-pay | Source: Other Acute Inpatient Hospital | Attending: Cardiology

## 2014-09-19 ENCOUNTER — Ambulatory Visit (HOSPITAL_COMMUNITY): Admit: 2014-09-19 | Payer: Self-pay | Admitting: Cardiovascular Disease

## 2014-09-19 DIAGNOSIS — I214 Non-ST elevation (NSTEMI) myocardial infarction: Secondary | ICD-10-CM | POA: Diagnosis not present

## 2014-09-19 DIAGNOSIS — R319 Hematuria, unspecified: Secondary | ICD-10-CM

## 2014-09-19 DIAGNOSIS — I2510 Atherosclerotic heart disease of native coronary artery without angina pectoris: Secondary | ICD-10-CM

## 2014-09-19 HISTORY — PX: LEFT HEART CATHETERIZATION WITH CORONARY/GRAFT ANGIOGRAM: SHX5450

## 2014-09-19 LAB — CBC
HCT: 39.9 % (ref 39.0–52.0)
Hemoglobin: 13.4 g/dL (ref 13.0–17.0)
MCH: 30 pg (ref 26.0–34.0)
MCHC: 33.6 g/dL (ref 30.0–36.0)
MCV: 89.3 fL (ref 78.0–100.0)
PLATELETS: 185 10*3/uL (ref 150–400)
RBC: 4.47 MIL/uL (ref 4.22–5.81)
RDW: 13.2 % (ref 11.5–15.5)
WBC: 6.9 10*3/uL (ref 4.0–10.5)

## 2014-09-19 LAB — COMPREHENSIVE METABOLIC PANEL
ALT: 21 U/L (ref 0–53)
AST: 30 U/L (ref 0–37)
Albumin: 3.7 g/dL (ref 3.5–5.2)
Alkaline Phosphatase: 83 U/L (ref 39–117)
Anion gap: 13 (ref 5–15)
BILIRUBIN TOTAL: 0.4 mg/dL (ref 0.3–1.2)
BUN: 15 mg/dL (ref 6–23)
CHLORIDE: 104 meq/L (ref 96–112)
CO2: 25 meq/L (ref 19–32)
Calcium: 9.2 mg/dL (ref 8.4–10.5)
Creatinine, Ser: 0.97 mg/dL (ref 0.50–1.35)
GFR calc Af Amer: >90 mL/min (ref >90)
GFR, EST NON AFRICAN AMERICAN: 85 mL/min — AB (ref >90)
GLUCOSE: 95 mg/dL (ref 70–99)
Potassium: 4.8 mEq/L (ref 3.7–5.3)
SODIUM: 142 meq/L (ref 137–147)
Total Protein: 6.9 g/dL (ref 6.0–8.3)

## 2014-09-19 LAB — LIPID PANEL
Cholesterol: 193 mg/dL (ref 0–200)
HDL: 36 mg/dL — ABNORMAL LOW (ref >39)
LDL CALC: 101 mg/dL — AB (ref 0–99)
TRIGLYCERIDES: 280 mg/dL — AB (ref <150)
Total CHOL/HDL Ratio: 5.4 RATIO
VLDL: 56 mg/dL — AB (ref 0–40)

## 2014-09-19 LAB — TROPONIN I: Troponin I: 0.36 ng/mL (ref <0.30)

## 2014-09-19 SURGERY — LEFT HEART CATHETERIZATION WITH CORONARY/GRAFT ANGIOGRAM

## 2014-09-19 MED ORDER — HEPARIN (PORCINE) IN NACL 2-0.9 UNIT/ML-% IJ SOLN
INTRAMUSCULAR | Status: AC
  Filled 2014-09-19: qty 500

## 2014-09-19 MED ORDER — SODIUM CHLORIDE 0.9 % IJ SOLN: 3.0000 mL | INTRAMUSCULAR | Status: DC | PRN

## 2014-09-19 MED ORDER — MORPHINE SULFATE 2 MG/ML IJ SOLN: 2.0000 mg | INTRAMUSCULAR | Status: DC | PRN

## 2014-09-19 MED ORDER — SODIUM CHLORIDE 0.9 % IV SOLN
INTRAVENOUS | Status: AC
Start: 2014-09-19 — End: 2014-09-19
  Administered 2014-09-19: 15:00:00 via INTRAVENOUS

## 2014-09-19 MED ORDER — RANOLAZINE ER 500 MG PO TB12
500.0000 mg | ORAL_TABLET | Freq: Two times a day (BID) | ORAL | Status: DC
  Administered 2014-09-19: 500 mg via ORAL
  Filled 2014-09-19 (×3): qty 1

## 2014-09-19 MED ORDER — LIDOCAINE HCL (PF) 1 % IJ SOLN
INTRAMUSCULAR | Status: AC
  Filled 2014-09-19: qty 30

## 2014-09-19 MED ORDER — SODIUM CHLORIDE 0.9 % IJ SOLN: 3.0000 mL | Freq: Two times a day (BID) | INTRAMUSCULAR | Status: DC

## 2014-09-19 MED ORDER — ISOSORBIDE MONONITRATE ER 30 MG PO TB24
30.0000 mg | ORAL_TABLET | Freq: Every day | ORAL | Status: DC
  Administered 2014-09-19: 30 mg via ORAL
  Filled 2014-09-19 (×2): qty 1

## 2014-09-19 MED ORDER — NITROGLYCERIN 1 MG/10 ML FOR IR/CATH LAB
INTRA_ARTERIAL | Status: AC
Start: 2014-09-19 — End: 2014-09-19
  Filled 2014-09-19: qty 10

## 2014-09-19 MED ORDER — SODIUM CHLORIDE 0.9 % IV SOLN: 250.0000 mL | INTRAVENOUS | Status: DC | PRN

## 2014-09-19 MED ORDER — ASPIRIN 81 MG PO CHEW
81.0000 mg | CHEWABLE_TABLET | Freq: Once | ORAL | Status: AC
  Administered 2014-09-19: 81 mg via ORAL
  Filled 2014-09-19: qty 1

## 2014-09-19 MED ORDER — SODIUM CHLORIDE 0.9 % IV SOLN: 1.0000 mL/kg/h | INTRAVENOUS | Status: DC

## 2014-09-19 MED ORDER — FENTANYL CITRATE 0.05 MG/ML IJ SOLN
INTRAMUSCULAR | Status: AC
  Filled 2014-09-19: qty 2

## 2014-09-19 MED ORDER — ASPIRIN 81 MG PO CHEW
81.0000 mg | CHEWABLE_TABLET | ORAL | Status: AC
  Administered 2014-09-19: 81 mg via ORAL

## 2014-09-19 MED ORDER — HEPARIN (PORCINE) IN NACL 100-0.45 UNIT/ML-% IJ SOLN
1200.0000 [IU]/h | INTRAMUSCULAR | Status: DC
  Administered 2014-09-19: 1050 [IU]/h via INTRAVENOUS
  Administered 2014-09-20: 1200 [IU]/h via INTRAVENOUS
  Filled 2014-09-19 (×3): qty 250

## 2014-09-19 MED ORDER — HEPARIN (PORCINE) IN NACL 2-0.9 UNIT/ML-% IJ SOLN
INTRAMUSCULAR | Status: AC
  Filled 2014-09-19: qty 1000

## 2014-09-19 MED ORDER — SODIUM CHLORIDE 0.9 % IV SOLN
1.0000 mL/kg/h | INTRAVENOUS | Status: DC
Start: 2014-09-19 — End: 2014-09-19
  Administered 2014-09-19: 1 mL/kg/h via INTRAVENOUS

## 2014-09-19 MED ORDER — MIDAZOLAM HCL 2 MG/2ML IJ SOLN
INTRAMUSCULAR | Status: AC
  Filled 2014-09-19: qty 2

## 2014-09-19 MED ORDER — ASPIRIN 81 MG PO CHEW: 81.0000 mg | CHEWABLE_TABLET | ORAL | Status: DC

## 2014-09-19 NOTE — Progress Notes (Signed)
Around 1700 patient reported chest pain of a 3. Patient described pain as tightness and heaviness. Patient's RN reported findings to Ignacia Bayley, NP and gave verbal orders to administer SL nitro and begin nitro drip at 10 mcg. Patient's VS stable and tolerated well. 5 minutes later Patient stated relief of chest pain. Patient remains on bedrest post sheath removal from previous Cath procedure. PA discussed beginning heparin this evening after sheath precautions have ended. Blood pressures remained stable (see flowsheet).   Patient educated and verbalized understanding.   Will continue to monitor closely.   Earlie Lou

## 2014-09-19 NOTE — Plan of Care (Signed)
Problem: Phase I Progression Outcomes Goal: Hemodynamically stable Outcome: Progressing Goal: MD aware of Cardiac Marker results Outcome: Completed/Met Date Met:  09/19/14 Goal: Voiding-avoid urinary catheter unless indicated Outcome: Not Applicable Date Met:  81/18/86  Problem: Phase II Progression Outcomes Goal: Cath/PCI Day Path if indicated Outcome: Completed/Met Date Met:  09/19/14  Problem: Phase I Progression Outcomes Goal: Initial discharge plan identified Outcome: Completed/Met Date Met:  09/19/14

## 2014-09-19 NOTE — Progress Notes (Addendum)
Patient ID: Ricardo Jimenez, male   DOB: March 22, 1951, 63 y.o.   MRN: [id number]    SUBJECTIVE: The patient had recurrent rest chest pain during the night. EKG reveals worsening diffuse ST flattening with depression. His first troponin after the pain has gone up to 0.36. I do not yet have an additional troponin since that time.  Filed Vitals:   09/19/14 0421 09/19/14 0448 09/19/14 0510 09/19/14 0644  BP: 137/84 122/78 106/71   Pulse: 67 69 65   Temp:      TempSrc:      Resp:      Height:      Weight:    211 lb 8 oz (95.936 kg)  SpO2:        No intake or output data in the 24 hours ending 09/19/14 0914  LABS: Basic Metabolic Panel:  Recent Labs  09/18/14 1915 09/19/14 0345  NA  --  142  K  --  4.8  CL  --  104  CO2  --  25  GLUCOSE  --  95  BUN  --  15  CREATININE 0.86 0.97  CALCIUM  --  9.2   Liver Function Tests:  Recent Labs  09/19/14 0345  AST 30  ALT 21  ALKPHOS 83  BILITOT 0.4  PROT 6.9  ALBUMIN 3.7   No results for input(s): LIPASE, AMYLASE in the last 72 hours. CBC:  Recent Labs  09/18/14 1915 09/19/14 0345  WBC 5.4 6.9  HGB 13.3 13.4  HCT 39.3 39.9  MCV 88.3 89.3  PLT 172 185   Cardiac Enzymes:  Recent Labs  09/18/14 1915 09/19/14 0345  TROPONINI <0.30 0.36*   BNP: Invalid input(s): POCBNP D-Dimer: No results for input(s): DDIMER in the last 72 hours. Hemoglobin A1C: No results for input(s): HGBA1C in the last 72 hours. Fasting Lipid Panel:  Recent Labs  09/19/14 0345  CHOL 193  HDL 36*  LDLCALC 101*  TRIG 280*  CHOLHDL 5.4   Thyroid Function Tests: No results for input(s): TSH, T4TOTAL, T3FREE, THYROIDAB in the last 72 hours.  Invalid input(s): FREET3  RADIOLOGY: No results found.  PHYSICAL EXAM   patient is pain-free at this time. He is oriented to person time and place. Affect is normal. Lungs are clear. Respiratory effort is nonlabored. Head is atraumatic. Sclerae and conjunctiva are normal. There is no jugular  venous distention. Patient is overweight. Abdomen is soft. There is no peripheral edema. There are no musculoskeletal deformities. There are no skin rashes. Neurologic is grossly intact.   TELEMETRY:  I have reviewed telemetry today September 19, 2014. There is normal sinus rhythm.  ASSESSMENT AND PLAN:    CAD (coronary artery disease)   Unstable angina     He has very unstable angina. With elevated troponin, his diagnosis now is non-STEMI. I'm not able to use heparin because of his hematuria yesterday. His IV nitroglycerin dose was turned up earlier this morning. I feel it is most prudent to proceed with urgent cardiac catheterization as the day goes on today. I've spoken with Dr. Fletcher Anon about this. He is in agreement. I have spoken with the on-call cardiac cath team. The patient is not having pain at this moment. He ate breakfast at 8 AM. We will proceed with cardiac catheterization with Dr. Fletcher Anon in the range of 12:30 today.  The patient has had bilateral carpal tunnel surgery. It is my understanding that on this basis, catheterization may have to be done from his groin.  We are prepping both groins and one of his wrists.    Hematuria     The patient had hematuria with IV heparin yesterday. Hematuria improved after we turned the heparin off. It appears that he will need urologic workup at some point to fully assess this problem. He has not had gross hematuria while on aspirin and Plavix as an outpatient.  As part of this evaluation today I spent an extended period of time talking to the patient, calling other physicians, calling the cath lab, writing orders, and making plans for his cardiac catheterization to be done today.   [name] 09/19/2014 9:14 AM

## 2014-09-19 NOTE — Progress Notes (Signed)
   09/19/14 1250  Clinical Encounter Type  Visited With Patient and family together  Visit Type Other (Comment) (Stemi)  Chaplain responded to stemi activation. Chaplain met pt and pt's wife in pt's room in 2W and provided prayer at their request. They are very spiritual, very involved in their faith community. They expressed gratitude for prayer. Page if needed.  Vanetta Mulders 09/19/2014 12:51 PM

## 2014-09-19 NOTE — Progress Notes (Signed)
Cardiac cath RN stated,2nd PIV not needed at this time.Elayna Tobler,RN

## 2014-09-19 NOTE — Progress Notes (Signed)
Pt awakened by lab and c/o chest pain which increased when getting up to BR. Upon return to bed and rest, pain did not go away. He c/o mid sternal non-radiating CP #4/10. Denied sob, no diaphoresis. BP 138/92. NTG drip increased to 15 mcg/min. After ~15 min pain decreased to 2/10, BP 137/84. NTG increased to 20 mcg/min. Pain then down to 1/10-tightness. BP 122/78. NTG increased to 25 mcg/min. Pain resolved and BP 106/71. Pt denies any complaints, says he is now on the verge of falling alseep. Troponin result this am- 0.36. Lab result and pt c/o CP requiring increased NTG called to Dr. Alba Cory.

## 2014-09-19 NOTE — Progress Notes (Signed)
ANTICOAGULATION CONSULT NOTE - Initial Consult  Pharmacy Consult for heparin Indication: chest pain/ACS  No Known Allergies  Patient Measurements: Height: 5\' 9"  (175.3 cm) Weight: 211 lb 8 oz (95.936 kg) IBW/kg (Calculated) : 70.7 Heparin Dosing Weight: 75 kg  Vital Signs: BP: 108/66 mmHg (11/29 1530) Pulse Rate: 65 (11/29 1530)  Labs:  Recent Labs  09/18/14 1915 09/19/14 0345  HGB 13.3 13.4  HCT 39.3 39.9  PLT 172 185  LABPROT 13.8  --   INR 1.05  --   CREATININE 0.86 0.97  TROPONINI <0.30 0.36*    Estimated Creatinine Clearance: 87.9 mL/min (by C-G formula based on Cr of 0.97).   Medical History: Past Medical History  Diagnosis Date  . CAD (coronary artery disease)     a. 06/2009 Cath: 3VD, nondominant RCA;  b. 06/2009 CABG x 4: LIMA->LAD, VG->OM1, VG->OM3, VG->D1;  c. 03/2011 ETT: normal;  d. 12/2013 NSTEMI/Cath/PCI: LM nl, LAD 100p, LCX 100p, RCA nl, LIMA->LAD ok, VG->Diag 63m, VG->OM1 99d(3.5x18 Xience DES), VG->OM3 100d, EF 55%.  [name] HTN (hypertension)   . Hypercholesterolemia   . PUD (peptic ulcer disease)   . 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 GERD (gastroesophageal reflux disease)   . Osteoarthritis   . Horseshoe kidney     Medications:  Prescriptions prior to admission  Medication Sig Dispense Refill Last Dose  . acetaminophen (TYLENOL) 650 MG CR tablet Take 1,300 mg by mouth every morning.    09/18/2014 at Unknown time  . aspirin EC 81 MG EC tablet Take 1 tablet (81 mg total) by mouth daily.   09/18/2014 at Unknown time  . atorvastatin (LIPITOR) 80 MG tablet Take 1 tablet (80 mg total) by mouth daily at 6 PM. 30 tablet 6 09/17/2014 at Unknown time  . clopidogrel (PLAVIX) 75 MG tablet Take 1 tablet (75 mg total) by mouth daily with breakfast. 30 tablet 3 09/18/2014 at Unknown time  . diphenhydramine-acetaminophen (TYLENOL PM EXTRA STRENGTH) 25-500 MG TABS Take 1 tablet by mouth at bedtime as needed. For sleep   Past Month at Unknown time  . gabapentin  (NEURONTIN) 300 MG capsule Take 300-600 mg by mouth 2 (two) times daily. Take 300 mg in the morning and 600 mg at bedtime    09/18/2014 at Unknown time  . glucosamine-chondroitin 500-400 MG tablet Take 1 tablet by mouth 2 (two) times daily.    09/18/2014 at Unknown time  . isosorbide mononitrate (IMDUR) 30 MG 24 hr tablet Take 1 tablet (30 mg total) by mouth 2 (two) times daily. 60 tablet 6 09/18/2014 at Unknown time  . metoprolol tartrate (LOPRESSOR) 25 MG tablet Take 25 mg by mouth 2 (two) times daily.    09/18/2014 at 0800  . Multiple Vitamin (MULTIVITAMIN) tablet Take 1 tablet by mouth daily.     09/18/2014 at Unknown time  . nitroGLYCERIN (NITROSTAT) 0.4 MG SL tablet Place 1 tablet (0.4 mg total) under the tongue every 5 (five) minutes as needed for chest pain. 25 tablet 4 unk  . potassium citrate (UROCIT-K) 5 MEQ (540 MG) SR tablet Take 5-10 mEq by mouth 2 (two) times daily. Take 2 tablets in the morning and 1 at night   09/18/2014 at Unknown time  . ranitidine (ZANTAC) 150 MG tablet take 1 tablet by mouth twice a day 60 tablet 4 09/18/2014 at Unknown time    Assessment: 63 yo man who was on heparin for NSTEMI on transfer from [facility].  He developed hematuria and heparin was  stopped.  Heparin will resume now s/p cath. His Hg this am was 13.4, PTLC 185 Goal of Therapy:  Heparin level 0.3-0.7 units/ml Monitor platelets by anticoagulation protocol: Yes   Plan:  Start heparin 4 hours after sheath pull at 1050 units/hr Check heparin level 6 hours after start Daily CBC and HL while on heparin. Monitor for bleeding complications  Thanks for allowing pharmacy to be a part of this patient's care.  Excell Seltzer, PharmD Clinical Pharmacist, [phone]  09/19/2014,5:05 PM

## 2014-09-19 NOTE — CV Procedure (Signed)
    Cardiac Catheterization Procedure Note  Name: Ricardo Jimenez MRN: [id number] DOB: 01/05/1951  Procedure: Left Heart Cath, Selective Coronary Angiography, SVG angiography, LIMA angiography, LV angiography  Indication: Non-ST elevation myocardial infarction with known history of coronary artery disease status post CABG and PCI on SVG to OM1 early this year.   Medications:  Sedation:  2 mg IV Versed, 100 mcg IV Fentanyl  Contrast:  85 ML Omnipaque  Procedural details: The right groin was prepped, draped, and anesthetized with 1% lidocaine. Using modified Seldinger technique, a 6 French sheath was introduced into the right femoral artery. Standard Judkins catheters were used for coronary angiography and left ventriculography. A LIMA catheter was used over a long exchange wire. Catheter exchanges were performed over a guidewire. There were no immediate procedural complications. The patient was transferred to the post catheterization recovery area for further monitoring.   Procedural Findings:  Hemodynamics: AO:  107/61   mmHg LV:  112/6    mmHg LVEDP: 12  mmHg  Coronary angiography: Coronary dominance: Left   Left Main:  70% distal stenosis.  Left Anterior Descending (LAD):  Occluded proximally  Circumflex (LCx):  Occluded proximally  Right Coronary Artery: Nondominant with 70-80% proximal disease which is unchanged from before.  SVG to OM 3: Occluded which is not a new finding.  SVG to OM1: Patent with 95% proximal stenosis and hazy disease in the midsegment with possible thrombus. The distal stent is patent with 20% in-stent restenosis. This mainly supplies OM1 and is no longer supplying the posterior AV groove artery retrograde.  SVG to diagonal: Occluded which is a new finding.  LIMA to LAD: Patent with no significant disease. This gives reasonable collaterals to the whole left circumflex distribution as well as the diagonal branches.  Left ventriculography: Left  ventricular systolic function is normal , LVEF is estimated at 55 %, there is no significant mitral regurgitation   Final Conclusions:   1. Severe underlying three-vessel coronary artery disease. Patent LIMA to LAD which supplies collaterals to the diagonal and left circumflex distribution. The SVG to diagonal is now occluded which is a new finding. SVG to OM 3 is chronically occluded. SVG to OM1 has new severe disease proximally with possible thrombus in the midsegment. 2. Normal LV systolic function and left ventricular end-diastolic pressure.  Recommendations:  This is a difficult situation. PCI on SVG to OM1 is possible. However, the graft is supplying a relatively small OM1 distribution. It has severe disease proximally and possible thrombus in the midsegment. Thus, the risks probably outweigh the benefits. I recommend optimizing medical therapy. I resumed long-acting nitroglycerin and added Ranexa. If we are not able to control angina medically, redo CABG should be considered for symptom relief. There is probably no mortality benefit from redo CABG given that his LIMA is widely patent.  Kathlyn Sacramento MD, [facility] 09/19/2014, 2:15 PM

## 2014-09-20 DIAGNOSIS — I214 Non-ST elevation (NSTEMI) myocardial infarction: Principal | ICD-10-CM

## 2014-09-20 LAB — HEPARIN LEVEL (UNFRACTIONATED)
Heparin Unfractionated: 0.22 IU/mL — ABNORMAL LOW (ref 0.30–0.70)
Heparin Unfractionated: 0.42 IU/mL (ref 0.30–0.70)

## 2014-09-20 LAB — CBC
HCT: 39 % (ref 39.0–52.0)
HEMOGLOBIN: 13.1 g/dL (ref 13.0–17.0)
MCH: 31 pg (ref 26.0–34.0)
MCHC: 33.6 g/dL (ref 30.0–36.0)
MCV: 92.4 fL (ref 78.0–100.0)
Platelets: 184 10*3/uL (ref 150–400)
RBC: 4.22 MIL/uL (ref 4.22–5.81)
RDW: 13.2 % (ref 11.5–15.5)
WBC: 6.5 10*3/uL (ref 4.0–10.5)

## 2014-09-20 LAB — POCT ACTIVATED CLOTTING TIME: Activated Clotting Time: 104 seconds

## 2014-09-20 MED ORDER — FENOFIBRATE 54 MG PO TABS
54.0000 mg | ORAL_TABLET | Freq: Every day | ORAL | Status: DC
  Administered 2014-09-20 – 2014-09-21 (×2): 54 mg via ORAL
  Filled 2014-09-20 (×2): qty 1

## 2014-09-20 MED ORDER — RANOLAZINE ER 500 MG PO TB12
1000.0000 mg | ORAL_TABLET | Freq: Two times a day (BID) | ORAL | Status: DC
  Administered 2014-09-20 – 2014-09-21 (×3): 1000 mg via ORAL
  Filled 2014-09-20 (×4): qty 2

## 2014-09-20 MED ORDER — ISOSORBIDE MONONITRATE ER 60 MG PO TB24
60.0000 mg | ORAL_TABLET | Freq: Every day | ORAL | Status: DC
  Administered 2014-09-20 – 2014-09-21 (×2): 60 mg via ORAL
  Filled 2014-09-20 (×2): qty 1

## 2014-09-20 NOTE — Progress Notes (Signed)
Utilization review completed.  

## 2014-09-20 NOTE — Progress Notes (Signed)
CARDIAC REHAB PHASE I   PRE:  Rate/Rhythm: 74 SR  BP:  Supine:   Sitting: 128/72  Standing:    SaO2: 98 RA  MODE:  Ambulation: 890 ft   POST:  Rate/Rhythm: 80  BP:  Supine:   Sitting: 148/82  Standing:    SaO2: 96 RA 0905-1050 Pt tolerated ambulation fair. He c/o of chest discomfort in left upper chest with walking and out to left shoulder. He states that pain was 2.5 on scale of 10. We took standing rest stop in hall when he c/o of chest discomfort and pain improved. Then on walk back to room return, relieved with rest on side of bed. Encouraged pt to try more walking later today and to report any discomfort. We discussed MI and Outpt. CRP. He did not attend Outpt. CRP after admission in March due to a large co-pay. He states that his insurance has now changed and it may be different. His wife is requesting more diet information, I provided this to them.  [name] RN 09/20/2014 9:47 AM

## 2014-09-20 NOTE — Plan of Care (Signed)
Problem: Discharge Progression Outcomes Goal: No anginal pain Outcome: Completed/Met Date Met:  09/20/14 Goal: Hemodynamically stable Outcome: Completed/Met Date Met:  09/20/14     

## 2014-09-20 NOTE — Progress Notes (Signed)
ANTICOAGULATION CONSULT NOTE - Follow Up Consult  Pharmacy Consult for Heparin  Indication: chest pain/ACS, s/p cath  No Known Allergies  Patient Measurements: Height: 5\' 9"  (175.3 cm) Weight: 213 lb 6.5 oz (96.8 kg) IBW/kg (Calculated) : 70.7  Vital Signs: Temp: 97.7 F (36.5 C) (11/30 0500) Temp Source: Oral (11/30 0500) BP: 104/72 mmHg (11/30 0500) Pulse Rate: 73 (11/30 0500)  Labs:  Recent Labs  09/18/14 1915 09/19/14 0345 09/20/14 0015 09/20/14 0910  HGB 13.3 13.4 13.1  --   HCT 39.3 39.9 39.0  --   PLT 172 185 184  --   LABPROT 13.8  --   --   --   INR 1.05  --   --   --   HEPARINUNFRC  --   --  0.22* 0.42  CREATININE 0.86 0.97  --   --   TROPONINI <0.30 0.36*  --   --     Estimated Creatinine Clearance: 88.3 mL/min (by C-G formula based on Cr of 0.97).   Assessment: Heparin level now therapeutic  Goal of Therapy:  Heparin level 0.3-0.7 units/ml Monitor platelets by anticoagulation protocol: Yes   Plan:  Continue heparin at 1200 units / hr Follow up AM labs  Thank you. Anette Guarneri, PharmD [phone] 09/20/2014,10:22 AM

## 2014-09-20 NOTE — Care Management Note (Signed)
    Page 1 of 1   09/20/2014     2:24:04 PM CARE MANAGEMENT NOTE 09/20/2014  Patient:  Ricardo Jimenez,Ricardo Jimenez   Account Number:  [id number]  Date Initiated:  09/20/2014  Documentation initiated by:  Marvetta Gibbons  Subjective/Objective Assessment:   Pt admitted wth NSTEMI     Action/Plan:   PTA pt lived at home- plan to return home   Anticipated DC Date:  09/22/2014   Anticipated DC Plan:  [place]  CM consult      Choice offered to / List presented to:             Status of service:  In process, will continue to follow Medicare Important Message given?   (If response is "NO", the following Medicare IM given date fields will be blank) Date Medicare IM given:   Medicare IM given by:   Date Additional Medicare IM given:   Additional Medicare IM given by:    Discharge Disposition:    Per UR Regulation:  Reviewed for med. necessity/level of care/duration of stay  If discussed at [place] of Stay Meetings, dates discussed:    Comments:  09/10/14- 1420- Marvetta Gibbons RN, BSN [phone] Pt started on Ranexa- per benefits check- PT COPAY WILL BE $45 - PRIOR AUTH IS NOT REQUIRED- info shared with pt.

## 2014-09-20 NOTE — Progress Notes (Signed)
    Subjective:  Denies dyspnea; had chest tightness lying on his right side last evening unlike previous cardiac pain.   Objective:  Filed Vitals:   09/19/14 2000 09/19/14 2100 09/20/14 0126 09/20/14 0500  BP: 103/58 107/57 97/54 104/72  Pulse: 71 69 74 73  Temp:  98.2 F (36.8 C)  97.7 F (36.5 C)  TempSrc:  Oral  Oral  Resp:  18 16 16   Height:      Weight:    213 lb 6.5 oz (96.8 kg)  SpO2:  96% 95% 100%    Intake/Output from previous day:  Intake/Output Summary (Last 24 hours) at 09/20/14 0749 Last data filed at 09/20/14 0600  Gross per 24 hour  Intake      0 ml  Output    625 ml  Net   -625 ml    Physical Exam: Physical exam: Well-developed well-nourished in no acute distress.  Skin is warm and dry.  HEENT is normal.  Neck is supple.  Chest is clear to auscultation with normal expansion.  Cardiovascular exam is regular rate and rhythm.  Abdominal exam nontender or distended. No masses palpated. Right groin with no hematoma and no bruit Extremities show no edema. neuro grossly intact    Lab Results: Basic Metabolic Panel:  Recent Labs  09/18/14 1915 09/19/14 0345  NA  --  142  K  --  4.8  CL  --  104  CO2  --  25  GLUCOSE  --  95  BUN  --  15  CREATININE 0.86 0.97  CALCIUM  --  9.2   CBC:  Recent Labs  09/19/14 0345 09/20/14 0015  WBC 6.9 6.5  HGB 13.4 13.1  HCT 39.9 39.0  MCV 89.3 92.4  PLT 185 184   Cardiac Enzymes:  Recent Labs  09/18/14 1915 09/19/14 0345  TROPONINI <0.30 0.36*     Assessment/Plan:  1 non-ST elevation myocardial infarction-Patient's troponin mildly elevated. Continue aspirin, plavix, beta blocker, statin and heparin. I have discussed the patient's cardiac catheterization with Dr. Fletcher Anon. The culprit appears to be the lesion in the vein graft to the first marginal. Dr Fletcher Anon feels this would be difficult to approach percutaneously and the long-term results would be poor. He feels that we should try to advance  medications and treat medically. If he has recurrent symptoms despite medications, then we could consider redo coronary artery bypass graft (to treat circumflex and diagonal disease). Discontinue IV nitroglycerin. Increase Imdur to 60 mg daily. Follow for recurrent symptoms. I will plan to discontinue heparin tomorrow morning if he remains pain-free. Recycle enzymes. 2 hematuria-patient states he has a long history of nephrolithiasis. He will follow-up with urology following discharge. 3 hyperlipidemia-continue statin. 4 hypertension-continue present blood pressure medications. Kirk Ruths 09/20/2014, 7:49 AM

## 2014-09-20 NOTE — Progress Notes (Signed)
Subjective: Pt reports CP this morning which occurs when he lays on his right side and resolves when he turns.  No CP when he walked to the bathroom.  He did not even remember the CP from yesterday evening.    Objective: Vital signs in last 24 hours: Temp:  [97.7 F (36.5 C)-98.2 F (36.8 C)] 97.7 F (36.5 C) (11/30 0500) Pulse Rate:  [63-80] 73 (11/30 0500) Resp:  [16-18] 16 (11/30 0500) BP: (97-128)/(40-78) 104/72 mmHg (11/30 0500) SpO2:  [95 %-100 %] 100 % (11/30 0500) Weight:  [213 lb 6.5 oz (96.8 kg)] 213 lb 6.5 oz (96.8 kg) (11/30 0500) Last BM Date: 09/18/14  Intake/Output from previous day: 11/29 0701 - 11/30 0700 In: -  Out: 625 [Urine:625] Intake/Output this shift: Total I/O In: -  Out: 275 [Urine:275]  Medications Current Facility-Administered Medications  Medication Dose Route Frequency Provider Last Rate Last Dose  . 0.9 %  sodium chloride infusion  250 mL Intravenous PRN Rogelia Mire, NP      . diphenhydrAMINE (BENADRYL) capsule 25 mg  25 mg Oral QHS PRN Carlena Bjornstad, MD       And  . acetaminophen (TYLENOL) tablet 500 mg  500 mg Oral QHS PRN Carlena Bjornstad, MD      . acetaminophen (TYLENOL) tablet 650 mg  650 mg Oral Q4H PRN Rogelia Mire, NP   650 mg at 09/19/14 1136  . aspirin EC tablet 81 mg  81 mg Oral Daily Rogelia Mire, NP   81 mg at 09/18/14 1645  . atorvastatin (LIPITOR) tablet 80 mg  80 mg Oral q1800 Rogelia Mire, NP   80 mg at 09/19/14 1626  . clopidogrel (PLAVIX) tablet 75 mg  75 mg Oral Q breakfast Rogelia Mire, NP   75 mg at 09/19/14 0803  . famotidine (PEPCID) tablet 20 mg  20 mg Oral Daily Rogelia Mire, NP   20 mg at 09/19/14 1012  . gabapentin (NEURONTIN) capsule 300 mg  300 mg Oral Daily Rogelia Mire, NP   300 mg at 09/19/14 1012  . gabapentin (NEURONTIN) tablet 600 mg  600 mg Oral QHS Carlena Bjornstad, MD   600 mg at 09/19/14 2131  . heparin ADULT infusion 100 units/mL (25000 units/250 mL)   1,200 Units/hr Intravenous Continuous Narda Bonds, RPH 12 mL/hr at 09/20/14 0123 1,200 Units/hr at 09/20/14 0123  . isosorbide mononitrate (IMDUR) 24 hr tablet 30 mg  30 mg Oral Daily Wellington Hampshire, MD   30 mg at 09/19/14 1627  . metoprolol tartrate (LOPRESSOR) tablet 25 mg  25 mg Oral BID Rogelia Mire, NP   25 mg at 09/19/14 2131  . morphine 2 MG/ML injection 2 mg  2 mg Intravenous Q1H PRN Rogelia Mire, NP      . multivitamin with minerals tablet 1 tablet  1 tablet Oral Daily Carlena Bjornstad, MD   1 tablet at 09/18/14 1658  . nitroGLYCERIN (NITROSTAT) SL tablet 0.4 mg  0.4 mg Sublingual Q5 min PRN Rogelia Mire, NP   0.4 mg at 09/19/14 1703  . nitroGLYCERIN 50 mg in dextrose 5 % 250 mL (0.2 mg/mL) infusion  3-30 mcg/min Intravenous Titrated Rogelia Mire, NP 3 mL/hr at 09/19/14 1704 10 mcg/min at 09/19/14 1704  . ondansetron (ZOFRAN) injection 4 mg  4 mg Intravenous Q6H PRN Rogelia Mire, NP      . ranolazine (RANEXA) 12 hr tablet 500 mg  500 mg Oral BID Wellington Hampshire, MD   500 mg at 09/19/14 1627  . sodium chloride 0.9 % injection 3 mL  3 mL Intravenous Q12H Rogelia Mire, NP   3 mL at 09/18/14 2147  . sodium chloride 0.9 % injection 3 mL  3 mL Intravenous PRN Rogelia Mire, NP        PE: General appearance: alert, cooperative and no distress Lungs: clear to auscultation bilaterally Heart: regular rate and rhythm, S1, S2 normal, no murmur, click, rub or gallop Extremities: No LEE Pulses: 2+ and symmetric Skin: Warm and dry Neurologic: Grossly normal  Lab Results:   Recent Labs  09/18/14 1915 09/19/14 0345 09/20/14 0015  WBC 5.4 6.9 6.5  HGB 13.3 13.4 13.1  HCT 39.3 39.9 39.0  PLT 172 185 184   BMET  Recent Labs  09/18/14 1915 09/19/14 0345  NA  --  142  K  --  4.8  CL  --  104  CO2  --  25  GLUCOSE  --  95  BUN  --  15  CREATININE 0.86 0.97  CALCIUM  --  9.2   PT/INR  Recent Labs  09/18/14 1915  LABPROT 13.8    INR 1.05   Cholesterol  Recent Labs  09/19/14 0345  CHOL 193   Lipid Panel     Component Value Date/Time   CHOL 193 09/19/2014 0345   TRIG 280* 09/19/2014 0345   HDL 36* 09/19/2014 0345   CHOLHDL 5.4 09/19/2014 0345   VLDL 56* 09/19/2014 0345   LDLCALC 101* 09/19/2014 0345     Cardiac Panel (last 3 results)  Recent Labs  09/18/14 1915 09/19/14 0345  TROPONINI <0.30 0.36*     Assessment/Plan  Active Problems:   CAD (coronary artery disease)   Unstable angina   Hematuria   Non-STEMI (non-ST elevated myocardial infarction) SP LHC revealing severe underlying three-vessel coronary artery disease. Patent LIMA to LAD which supplies collaterals to the diagonal and left circumflex distribution. The SVG to diagonal is occluded, which is a new finding. SVG to OM 3 is chronically occluded. SVG to OM1 has new severe disease proximally with possible thrombus in the midsegment.  Normal LV systolic function and left ventricular end-diastolic pressure.  Medical therapy recommended by Dr. Fletcher Anon.  He added renexa 500 to Imdur 30. He did think PCI on SVG to OM1 is possible.  My be able to increase Imdur but BP is soft. Increase ranexa to 1000 bid.   The patient had 3/10 CP yesterday around 1740hrs.  IV NTG and heparin were started.   CP this morning sounds positional/MSK.  DC IV NTG.  Ambulate in the hall.  If no angina after working with cardiac rehab, then DC home.     HLD:  On Lipitor 80.  TG 280.  Will add fenofibrate 54.  Low carb diet discussed   LOS: 2 days    HAGER, BRYAN PA-C 09/20/2014 6:56 AM  See progress note from later today Kirk Ruths

## 2014-09-20 NOTE — Plan of Care (Signed)
Problem: Phase I Progression Outcomes Goal: Hemodynamically stable Outcome: Completed/Met Date Met:  09/20/14 Goal: Anginal pain relieved Outcome: Progressing Goal: Aspirin unless contraindicated Outcome: Completed/Met Date Met:  09/20/14  Problem: Phase II Progression Outcomes Goal: Hemodynamically stable Outcome: Completed/Met Date Met:  09/20/14 Goal: Anginal pain relieved Outcome: Progressing Goal: Stress Test if indicated Outcome: Not Applicable Date Met:  62/69/48 Goal: CV Risk Factors identified Outcome: Progressing  Problem: Phase III Progression Outcomes Goal: Hemodynamically stable Outcome: Completed/Met Date Met:  09/20/14 Goal: No anginal pain Outcome: Progressing Goal: Cath/PCI Path as indicated Outcome: Completed/Met Date Met:  09/20/14 Goal: Vascular site scale level 0 - I Vascular Site Scale Level 0: No bruising/bleeding/hematoma Level I (Mild): Bruising/Ecchymosis, minimal bleeding/ooozing, palpable hematoma < 3 cm Level II (Moderate): Bleeding not affecting hemodynamic parameters, pseudoaneurysm, palpable hematoma > 3 cm Level III (Severe) Bleeding which affects hemodynamic parameters or retroperitoneal hemorrhage  Outcome: Completed/Met Date Met:  09/20/14 Goal: Discharge plan remains appropriate-arrangements made Outcome: Completed/Met Date Met:  09/20/14 Goal: Tolerating diet Outcome: Completed/Met Date Met:  09/20/14

## 2014-09-20 NOTE — Progress Notes (Signed)
ANTICOAGULATION CONSULT NOTE - Follow Up Consult  Pharmacy Consult for Heparin  Indication: chest pain/ACS, s/p cath  No Known Allergies  Patient Measurements: Height: 5\' 9"  (175.3 cm) Weight: 211 lb 8 oz (95.936 kg) IBW/kg (Calculated) : 70.7  Vital Signs: Temp: 98.2 F (36.8 C) (11/29 2100) Temp Source: Oral (11/29 2100) BP: 107/57 mmHg (11/29 2100) Pulse Rate: 69 (11/29 2100)  Labs:  Recent Labs  09/18/14 1915 09/19/14 0345 09/20/14 0015  HGB 13.3 13.4 13.1  HCT 39.3 39.9 39.0  PLT 172 185 184  LABPROT 13.8  --   --   INR 1.05  --   --   HEPARINUNFRC  --   --  0.22*  CREATININE 0.86 0.97  --   TROPONINI <0.30 0.36*  --     Estimated Creatinine Clearance: 87.9 mL/min (by C-G formula based on Cr of 0.97).   Assessment: Sub-therapeutic heparin level after re-start s/p cath. Other labs as above. No issues per RN.   Goal of Therapy:  Heparin level 0.3-0.7 units/ml Monitor platelets by anticoagulation protocol: Yes   Plan:  -Increase heparin to 1200 units/hr -0900 HL -Daily CBC/HL -Monitor for bleedin  Narda Bonds 09/20/2014,1:14 AM

## 2014-09-21 LAB — HEPARIN LEVEL (UNFRACTIONATED): Heparin Unfractionated: 0.39 IU/mL (ref 0.30–0.70)

## 2014-09-21 LAB — CBC
HCT: 37.7 % — ABNORMAL LOW (ref 39.0–52.0)
HEMOGLOBIN: 12.7 g/dL — AB (ref 13.0–17.0)
MCH: 30.2 pg (ref 26.0–34.0)
MCHC: 33.7 g/dL (ref 30.0–36.0)
MCV: 89.8 fL (ref 78.0–100.0)
Platelets: 194 10*3/uL (ref 150–400)
RBC: 4.2 MIL/uL — AB (ref 4.22–5.81)
RDW: 13.3 % (ref 11.5–15.5)
WBC: 7.2 10*3/uL (ref 4.0–10.5)

## 2014-09-21 LAB — TROPONIN I: Troponin I: <0.3 ng/mL (ref <0.30)

## 2014-09-21 MED ORDER — RANOLAZINE ER 1000 MG PO TB12: 1000.0000 mg | ORAL_TABLET | Freq: Two times a day (BID) | ORAL | Status: DC

## 2014-09-21 MED ORDER — FENOFIBRATE 54 MG PO TABS: 54.0000 mg | ORAL_TABLET | Freq: Every day | ORAL | Status: DC

## 2014-09-21 NOTE — Progress Notes (Signed)
    Subjective:  Denies dyspnea; Denies any recurrent pain similar to cardiac pain.   Objective:  Filed Vitals:   09/20/14 0500 09/20/14 1344 09/20/14 2108 09/21/14 0421  BP: 104/72 110/64 102/68 124/84  Pulse: 73 70 73 71  Temp: 97.7 F (36.5 C) 97.7 F (36.5 C) 98.3 F (36.8 C) 97.8 F (36.6 C)  TempSrc: Oral Oral Oral Oral  Resp: 16 18 18 18   Height:      Weight: 213 lb 6.5 oz (96.8 kg)   213 lb 6.4 oz (96.798 kg)  SpO2: 100% 100% 98% 100%    Intake/Output from previous day:  Intake/Output Summary (Last 24 hours) at 09/21/14 0925 Last data filed at 09/21/14 0730  Gross per 24 hour  Intake   1080 ml  Output    650 ml  Net    430 ml    Physical Exam: Physical exam: Well-developed well-nourished in no acute distress.  Skin is warm and dry.  HEENT is normal.  Neck is supple.  Chest is clear to auscultation with normal expansion.  Cardiovascular exam is regular rate and rhythm.  Abdominal exam nontender or distended. No masses palpated. Right groin with no hematoma and no bruit Extremities show no edema. neuro grossly intact    Lab Results: Basic Metabolic Panel:  Recent Labs  09/18/14 1915 09/19/14 0345  NA  --  142  K  --  4.8  CL  --  104  CO2  --  25  GLUCOSE  --  95  BUN  --  15  CREATININE 0.86 0.97  CALCIUM  --  9.2   CBC:  Recent Labs  09/20/14 0015 09/21/14 0246  WBC 6.5 7.2  HGB 13.1 12.7*  HCT 39.0 37.7*  MCV 92.4 89.8  PLT 184 194   Cardiac Enzymes:  Recent Labs  09/18/14 1915 09/19/14 0345  TROPONINI <0.30 0.36*     Assessment/Plan:  1 non-ST elevation myocardial infarction-Patient's troponin mildly elevated; will repeat. Continue aspirin, plavix, imdur, ranexa, beta blocker, statin. I have discussed the patient's cardiac catheterization with Dr. Fletcher Anon. The culprit appears to be the lesion in the vein graft to the first marginal. Dr Fletcher Anon feels this would be difficult to approach percutaneously and the long-term results  would be poor. He feels that we should try to advance medications and treat medically. If he has recurrent symptoms despite medications, then we could consider redo coronary artery bypass graft (to treat circumflex and diagonal disease). Discontinue heparin; if no recurrent CP, DC later today and FU with Dr Bronson Ing as scheduled. 2 hematuria-patient states he has a long history of nephrolithiasis. He will follow-up with urology following discharge. 3 hyperlipidemia-continue statin. 4 hypertension-continue present blood pressure medications. >30 min PA and physician time D2 Kirk Ruths 09/21/2014, 9:25 AM

## 2014-09-21 NOTE — Plan of Care (Signed)
Problem: Phase I Progression Outcomes Goal: Anginal pain relieved Outcome: Completed/Met Date Met:  09/21/14  Problem: Phase II Progression Outcomes Goal: Anginal pain relieved Outcome: Completed/Met Date Met:  09/21/14

## 2014-09-21 NOTE — Progress Notes (Signed)
[id number] Cardiac rehab Reviewed MI education with pt and wife. They voice understanding. Pt was not able to attend Outpt. CRP due to the co-pay when he was here in March. His insurance is now different. He agrees to referral to [place]. Pt is walking independently and denies any cp or SOB walking. Deon Pilling, RN 09/21/2014 11:56 AM

## 2014-09-21 NOTE — Discharge Summary (Signed)
Physician Discharge Summary      Patient ID: Ricardo Jimenez MRN: [id number] DOB/AGE: [birth year] 63 y.o.  Admit date: 09/18/2014 Discharge date: 09/21/2014  Admission Diagnoses:  NSTEMI  Discharge Diagnoses:  Active Problems:   CAD (coronary artery disease)   Unstable angina   Hematuria   Non-STEMI (non-ST elevated myocardial infarction)   Discharged Condition: stable  Hospital Course:   63 y/o male w/o a h/o CAD s/p CABG x 4 in 2010 and more recently NSTEMI in 12/2013 requiring DES to the VG->OM1. The VG->OM3 was occluded @ that time while the LIMA->LAD and VG->Diag were patent. He did well following that event but was recently seen by Dr. Bronson Ing and c/o recurrent mild exertional angina. His imdur was increased to 30mg  BID. Unfortunately, he continued to have exertional chest pain and it is now occurring with minimal activity and requing prn ntg on a daily basis. He arranged for early f/u than previously scheduled and got an appt for next week. Last night however, he had 3-4 episodes of c/p that awoke him from sleep and this prompted him to present to the [facility] ED this AM. There, ECG showed lateral ST depression, which was new c/o ECG in March. Initial troponin was normal He was transferred to [facility] for further eval. He is currently on heparin but just used the toilet and noted hematuria.   Patient was admitted. Second troponin was 0.36 however, there was no subsequent troponin until December 1, which was within normal limits. Patient was started on IV nitroglycerin and IV heparin. Did develop hematuria and the heparin was discontinued. Aspirin Plavix beta blocker as well as statin were continued.  Underwent left heart catheterization which revealed severe underlying three-vessel coronary artery disease.  Patent LIMA to LAD which supplies collaterals to the diagonal and left circumflex distribution. The SVG to diagonal is now occluded which is a new finding. SVG to OM 3 is  chronically occluded. SVG to OM1 has new severe disease proximally with possible thrombus in the midsegment. Normal LV systolic function and left ventricular end-diastolic pressure.  Patient was resumed on indoor 30 mg daily which was then increased to 60 mg. Ranexa 500 mg was also added and then increased to 1000 mg twice daily.  Per Dr. Fletcher Anon: "PCI on SVG to OM1 is possible. However, the graft is supplying a relatively small OM1 distribution. It has severe disease proximally and possible thrombus in the midsegment. Thus, the risks probably outweigh the benefits. I recommend optimizing medical therapy.  The night of the patient's heart catheterization he developed 3 out of 10 chest pain. He was given a sublingual nitroglycerin and restarted on nitro drip. The following day this was discontinued. Lipid panel revealed triglycerides of 280. Patient was started on fenofibrate 54 mg. Patient ambulated with cardiac rehabilitation 890 feet on November 30. He did develop 2.5 out of 10 chest pain which improved after rest.  The following day patient was ambulating on his own and denied any chest pain or shortness of breath.  He agrees to referral to cardiac rehabilitation in [place].  The patient was seen by Dr. Stanford Breed who felt he was stable for DC home.  Follow-up with urologist was recommended.    Consults: None  Significant Diagnostic Studies:  Lipid Panel     Component Value Date/Time   CHOL 193 09/19/2014 0345   TRIG 280* 09/19/2014 0345   HDL 36* 09/19/2014 0345   CHOLHDL 5.4 09/19/2014 0345   VLDL 56* 09/19/2014 0345  [place] 101* 09/19/2014 0345   Cardiac Catheterization Procedure Note  Name: Ricardo Jimenez MRN: [id number] DOB: 10/21/1951  Procedure: Left Heart Cath, Selective Coronary Angiography, SVG angiography, LIMA angiography, LV angiography  Indication: Non-ST elevation myocardial infarction with known history of coronary artery disease status post CABG and PCI on SVG to OM1  early this year.  Medications: Sedation: 2 mg IV Versed, 100 mcg IV Fentanyl Contrast: 85 ML Omnipaque  Procedural details: The right groin was prepped, draped, and anesthetized with 1% lidocaine. Using modified Seldinger technique, a 6 French sheath was introduced into the right femoral artery. Standard Judkins catheters were used for coronary angiography and left ventriculography. A LIMA catheter was used over a long exchange wire. Catheter exchanges were performed over a guidewire. There were no immediate procedural complications. The patient was transferred to the post catheterization recovery area for further monitoring.  Procedural Findings:  Hemodynamics: AO: 107/61 mmHg LV: 112/6 mmHg LVEDP: 12 mmHg  Coronary angiography: Coronary dominance: Left   Left Main: 70% distal stenosis.  Left Anterior Descending (LAD): Occluded proximally  Circumflex (LCx): Occluded proximally  Right Coronary Artery: Nondominant with 70-80% proximal disease which is unchanged from before.  SVG to OM 3: Occluded which is not a new finding.  SVG to OM1: Patent with 95% proximal stenosis and hazy disease in the midsegment with possible thrombus. The distal stent is patent with 20% in-stent restenosis. This mainly supplies OM1 and is no longer supplying the posterior AV groove artery retrograde.  SVG to diagonal: Occluded which is a new finding.  LIMA to LAD: Patent with no significant disease. This gives reasonable collaterals to the whole left circumflex distribution as well as the diagonal branches.  Left ventriculography: Left ventricular systolic function is normal , LVEF is estimated at 55 %, there is no significant mitral regurgitation   Final Conclusions:  1. Severe underlying three-vessel coronary artery disease. Patent LIMA to LAD which supplies collaterals to the diagonal and left  circumflex distribution. The SVG to diagonal is now occluded which is a new finding. SVG to OM 3 is chronically occluded. SVG to OM1 has new severe disease proximally with possible thrombus in the midsegment. 2. Normal LV systolic function and left ventricular end-diastolic pressure.  Recommendations: This is a difficult situation. PCI on SVG to OM1 is possible. However, the graft is supplying a relatively small OM1 distribution. It has severe disease proximally and possible thrombus in the midsegment. Thus, the risks probably outweigh the benefits. I recommend optimizing medical therapy. I resumed long-acting nitroglycerin and added Ranexa. If we are not able to control angina medically, redo CABG should be considered for symptom relief. There is probably no mortality benefit from redo CABG given that his LIMA is widely patent.  Kathlyn Sacramento MD, [facility] 09/19/2014, 2:15 PM  Treatments:  See above  Discharge Exam: Blood pressure 111/71, pulse 76, temperature 97.9 F (36.6 C), temperature source Oral, resp. rate 18, height 5\' 9"  (1.753 m), weight 213 lb 6.4 oz (96.798 kg), SpO2 100 %.   Disposition: 01-Home or Self Care      Discharge Instructions    Diet - low sodium heart healthy    Complete by:  As directed      Increase activity slowly    Complete by:  As directed             Medication List    TAKE these medications        acetaminophen 650 MG CR tablet  Commonly known  as:  TYLENOL  Take 1,300 mg by mouth every morning.     aspirin 81 MG EC tablet  Take 1 tablet (81 mg total) by mouth daily.     atorvastatin 80 MG tablet  Commonly known as:  LIPITOR  Take 1 tablet (80 mg total) by mouth daily at 6 PM.     clopidogrel 75 MG tablet  Commonly known as:  PLAVIX  Take 1 tablet (75 mg total) by mouth daily with breakfast.     fenofibrate 54 MG tablet  Take 1 tablet (54 mg total) by mouth daily.     gabapentin 300 MG capsule  Commonly known as:  NEURONTIN  - Take  300-600 mg by mouth 2 (two) times daily. Take 300 mg in the morning and 600 mg at bedtime  -      glucosamine-chondroitin 500-400 MG tablet  Take 1 tablet by mouth 2 (two) times daily.     isosorbide mononitrate 30 MG 24 hr tablet  Commonly known as:  IMDUR  Take 1 tablet (30 mg total) by mouth 2 (two) times daily.     metoprolol tartrate 25 MG tablet  Commonly known as:  LOPRESSOR  Take 25 mg by mouth 2 (two) times daily.     multivitamin tablet  Take 1 tablet by mouth daily.     nitroGLYCERIN 0.4 MG SL tablet  Commonly known as:  NITROSTAT  Place 1 tablet (0.4 mg total) under the tongue every 5 (five) minutes as needed for chest pain.     potassium citrate 5 MEQ (540 MG) SR tablet  Commonly known as:  UROCIT-K  Take 5-10 mEq by mouth 2 (two) times daily. Take 2 tablets in the morning and 1 at night     ranitidine 150 MG tablet  Commonly known as:  ZANTAC  take 1 tablet by mouth twice a day     ranolazine 1000 MG SR tablet  Commonly known as:  RANEXA  Take 1 tablet (1,000 mg total) by mouth 2 (two) times daily.     TYLENOL PM EXTRA STRENGTH 25-500 MG Tabs  Generic drug:  diphenhydramine-acetaminophen  Take 1 tablet by mouth at bedtime as needed. For sleep       Follow-up Information    Follow up with Herminio Commons, MD On 09/23/2014.   Specialty:  Cardiology   Why:  3:40 PM   Contact information:   [address] [place] [address] [phone]      Greater than 30 minutes was spent completing the patient's discharge.   SignedTarri Fuller, [place] 09/21/2014, 4:13 PM

## 2014-09-21 NOTE — Progress Notes (Signed)
Pt has ambulated a total of 3 times around nursing station, without complications. Pt denies chest pain/sob. monitoring will continue.

## 2014-09-21 NOTE — Progress Notes (Signed)
ANTICOAGULATION CONSULT NOTE - Follow Up Consult  Pharmacy Consult for Heparin  Indication: chest pain/ACS, s/p cath  No Known Allergies  Patient Measurements: Height: 5\' 9"  (175.3 cm) Weight: 213 lb 6.4 oz (96.798 kg) IBW/kg (Calculated) : 70.7  Vital Signs: Temp: 97.8 F (36.6 C) (12/01 0421) Temp Source: Oral (12/01 0421) BP: 124/84 mmHg (12/01 0421) Pulse Rate: 71 (12/01 0421)  Labs:  Recent Labs  09/18/14 1915 09/19/14 0345 09/20/14 0015 09/20/14 0910 09/21/14 0246  HGB 13.3 13.4 13.1  --  12.7*  HCT 39.3 39.9 39.0  --  37.7*  PLT 172 185 184  --  194  LABPROT 13.8  --   --   --   --   INR 1.05  --   --   --   --   HEPARINUNFRC  --   --  0.22* 0.42 0.39  CREATININE 0.86 0.97  --   --   --   TROPONINI <0.30 0.36*  --   --   --     Estimated Creatinine Clearance: 88.3 mL/min (by C-G formula based on Cr of 0.97).  Assessment: Heparin level therapeutic CBC stable  Goal of Therapy:  Heparin level 0.3-0.7 units/ml Monitor platelets by anticoagulation protocol: Yes   Plan:  Continue heparin at 1200 units / hr Follow up AM labs (if heparin not discontinued)  Thank you. Anette Guarneri, PharmD [phone] 09/21/2014,9:05 AM

## 2014-09-22 ENCOUNTER — Ambulatory Visit: Payer: 59 | Admitting: Cardiovascular Disease

## 2014-09-23 ENCOUNTER — Ambulatory Visit (INDEPENDENT_AMBULATORY_CARE_PROVIDER_SITE_OTHER): Payer: 59 | Admitting: Cardiovascular Disease

## 2014-09-23 ENCOUNTER — Encounter: Payer: Self-pay | Admitting: Cardiovascular Disease

## 2014-09-23 VITALS — BP 122/79 | HR 71 | Ht 69.0 in | Wt 215.0 lb

## 2014-09-23 DIAGNOSIS — I10 Essential (primary) hypertension: Secondary | ICD-10-CM

## 2014-09-23 DIAGNOSIS — E782 Mixed hyperlipidemia: Secondary | ICD-10-CM

## 2014-09-23 DIAGNOSIS — Z713 Dietary counseling and surveillance: Secondary | ICD-10-CM

## 2014-09-23 DIAGNOSIS — I252 Old myocardial infarction: Secondary | ICD-10-CM

## 2014-09-23 DIAGNOSIS — Z9289 Personal history of other medical treatment: Secondary | ICD-10-CM

## 2014-09-23 DIAGNOSIS — I25709 Atherosclerosis of coronary artery bypass graft(s), unspecified, with unspecified angina pectoris: Secondary | ICD-10-CM

## 2014-09-23 DIAGNOSIS — Z87898 Personal history of other specified conditions: Secondary | ICD-10-CM

## 2014-09-23 NOTE — Patient Instructions (Signed)
Your physician recommends that you schedule a follow-up appointment in: 3 months. Your physician recommends that you continue on your current medications as directed. Please refer to the Current Medication list given to you today. 

## 2014-09-23 NOTE — Progress Notes (Addendum)
Patient ID: Ricardo Jimenez, male   DOB: 02-18-1951, 63 y.o.   MRN: [id number]      SUBJECTIVE: The patient presents for follow up after undergoing coronary angiography for a NSTEMI. Results from 09/19/14 are as follows as stated by Dr. Fletcher Anon: Final Conclusions:  1. Severe underlying three-vessel coronary artery disease. Patent LIMA to LAD which supplies collaterals to the diagonal and left circumflex distribution. The SVG to diagonal is now occluded which is a new finding. SVG to OM 3 is chronically occluded. SVG to OM1 has new severe disease proximally with possible thrombus in the midsegment. 2. Normal LV systolic function and left ventricular end-diastolic pressure.  Recommendations: This is a difficult situation. PCI on SVG to OM1 is possible. However, the graft is supplying a relatively small OM1 distribution. It has severe disease proximally and possible thrombus in the midsegment. Thus, the risks probably outweigh the benefits. I recommend optimizing medical therapy. I resumed long-acting nitroglycerin and added Ranexa. If we are not able to control angina medically, redo CABG should be considered for symptom relief. There is probably no mortality benefit from redo CABG given that his LIMA is widely patent.  He was noted to have some hematuria and follow up with urology was recommended.  He is feeling well and is without complaints. He is here with his wife of 39 years, Golden Circle, who is a first grade Automotive engineer. She has been trying to help them both change their diet to become more heart healthy, as he enjoys eating a lot of candy. He had done a strenuous climb up a large hill prior to experiencing chest pain and wonders if this provoked his MI.  He currently denies chest pain, shortness of breath, and hematuria. He is planning on participating in cardiac rehabilitation in [place], [place].    Review of Systems: As per "subjective", otherwise negative.  No Known  Allergies  Current Outpatient Prescriptions  Medication Sig Dispense Refill  . acetaminophen (TYLENOL) 650 MG CR tablet Take 1,300 mg by mouth every morning.     [name] aspirin EC 81 MG EC tablet Take 1 tablet (81 mg total) by mouth daily.    [name] atorvastatin (LIPITOR) 80 MG tablet Take 1 tablet (80 mg total) by mouth daily at 6 PM. 30 tablet 6  . clopidogrel (PLAVIX) 75 MG tablet Take 1 tablet (75 mg total) by mouth daily with breakfast. 30 tablet 3  . diphenhydramine-acetaminophen (TYLENOL PM EXTRA STRENGTH) 25-500 MG TABS Take 1 tablet by mouth at bedtime as needed. For sleep    . fenofibrate 54 MG tablet Take 1 tablet (54 mg total) by mouth daily. 30 tablet 5  . gabapentin (NEURONTIN) 300 MG capsule Take 300-600 mg by mouth 2 (two) times daily. Take 300 mg in the morning and 600 mg at bedtime     . glucosamine-chondroitin 500-400 MG tablet Take 1 tablet by mouth 2 (two) times daily.     . isosorbide mononitrate (IMDUR) 30 MG 24 hr tablet Take 1 tablet (30 mg total) by mouth 2 (two) times daily. 60 tablet 6  . metoprolol tartrate (LOPRESSOR) 25 MG tablet Take 25 mg by mouth 2 (two) times daily.     . Multiple Vitamin (MULTIVITAMIN) tablet Take 1 tablet by mouth daily.      . nitroGLYCERIN (NITROSTAT) 0.4 MG SL tablet Place 1 tablet (0.4 mg total) under the tongue every 5 (five) minutes as needed for chest pain. 25 tablet 4  . potassium citrate (UROCIT-K) 5  MEQ (540 MG) SR tablet Take 5-10 mEq by mouth 2 (two) times daily. Take 2 tablets in the morning and 1 at night    . ranitidine (ZANTAC) 150 MG tablet take 1 tablet by mouth twice a day 60 tablet 4  . ranolazine (RANEXA) 1000 MG SR tablet Take 1 tablet (1,000 mg total) by mouth 2 (two) times daily. 60 tablet 5  . traMADol (ULTRAM) 50 MG tablet Take 50 mg by mouth 2 (two) times daily.     No current facility-administered medications for this visit.    Past Medical History  Diagnosis Date  . CAD (coronary artery disease)     a. 06/2009 Cath:  3VD, nondominant RCA;  b. 06/2009 CABG x 4: LIMA->LAD, VG->OM1, VG->OM3, VG->D1;  c. 03/2011 ETT: normal;  d. 12/2013 NSTEMI/Cath/PCI: LM nl, LAD 100p, LCX 100p, RCA nl, LIMA->LAD ok, VG->Diag 9m, VG->OM1 99d(3.5x18 Xience DES), VG->OM3 100d, EF 55%.  [name] HTN (hypertension)   . Hypercholesterolemia   . PUD (peptic ulcer disease)   . 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 GERD (gastroesophageal reflux disease)   . Osteoarthritis   . Horseshoe kidney     Past Surgical History  Procedure Laterality Date  . Other surgical history      BIL.CARPAL TUNNEL SURG.  . Rt.knee replacement    . Cardiac catheterization  CABG  . Kidney stone surgery    . Carpal tunnel release      bil  . Knee arthroscopy      rt x2, lft x3  . Coronary artery bypass graft  2010  . Hernia repair  2011    rt ing  . Total knee arthroplasty  02/11/2012    Procedure: TOTAL KNEE ARTHROPLASTY;  Surgeon: Rudean Haskell, MD;  Location: [facility];  Service: Orthopedics;  Laterality: Left;  left total knee replacement    History   Social History  . Marital Status: Married    Spouse Name: N/A    Number of Children: N/A  . Years of Education: N/A   Occupational History  . Not on file.   Social History Main Topics  . Smoking status: Never Smoker   . Smokeless tobacco: Never Used     Comment: occ beer  . Alcohol Use: 0.0 oz/week    0 Not specified per week     Comment: 07/22/14 Beer every now and then- AJ  . Drug Use: No  . Sexual Activity:    Partners: Female   Other Topics Concern  . Not on file   Social History Narrative   Married and lives in [place], [place] with his wife.  2 children.  Works as Photographer.     Filed Vitals:   09/23/14 1548  BP: 122/79  Pulse: 71  Height: 5\' 9"  (1.753 m)  Weight: 215 lb (97.523 kg)  SpO2: 96%    PHYSICAL EXAM General: NAD HEENT: Normal. Neck: No JVD, no thyromegaly. Lungs: Clear to auscultation bilaterally with normal respiratory effort. CV: Nondisplaced PMI.  Regular  rate and rhythm, normal S1/S2, no S3/S4, no murmur. No pretibial or periankle edema.  No carotid bruit.  Normal pedal pulses.  Abdomen: Soft, nontender, no hepatosplenomegaly, no distention.  Neurologic: Alert and oriented x 3.  Psych: Normal affect. Skin: Normal. Musculoskeletal: Normal range of motion, no gross deformities. Extremities: No clubbing or cyanosis.   ECG: Most recent ECG reviewed.      ASSESSMENT AND PLAN: 1. CAD/CABG with recent NSTEMI: Asymptomatic at present.  Continue aspirin 81 mg, Lipitor 80 mg, Plavix 75 mg, metoprolol, Imdur, and Ranexa. 2. Essential HTN: Well controlled today. No changes to therapy. 3. Hyperlipidemia: Lipids on 09/19/14 showed TG 280, HDL 36, LDL 101. Continue high intensity statin therapy along with fenofibrate. 4. Extensive dietary counseling provided.  Dispo: f/u 3 months.  Time spent: 40 minutes, of which >50% reviewing hospital records, cath findings with patient, and discussion of medical and dietary modification.   Kate Sable, M.D., F.A.C.C.

## 2014-09-23 NOTE — Addendum Note (Signed)
Addended by: Kate Sable A on: 09/23/2014 04:35 PM   Modules accepted: Level of Service

## 2014-09-30 ENCOUNTER — Encounter (HOSPITAL_COMMUNITY): Payer: Self-pay | Admitting: Cardiology

## 2014-11-05 ENCOUNTER — Telehealth: Payer: Self-pay | Admitting: *Deleted

## 2014-11-09 ENCOUNTER — Ambulatory Visit (INDEPENDENT_AMBULATORY_CARE_PROVIDER_SITE_OTHER): Payer: Medicare Other | Admitting: Surgery

## 2014-11-09 ENCOUNTER — Encounter: Payer: Self-pay | Admitting: Surgery

## 2014-11-09 VITALS — BP 100/65 | HR 67 | Resp 16 | Ht 69.0 in | Wt 214.0 lb

## 2014-11-09 DIAGNOSIS — Z951 Presence of aortocoronary bypass graft: Secondary | ICD-10-CM

## 2014-11-09 DIAGNOSIS — R079 Chest pain, unspecified: Secondary | ICD-10-CM

## 2014-11-10 ENCOUNTER — Ambulatory Visit: Payer: Self-pay | Admitting: Surgery

## 2014-11-11 ENCOUNTER — Encounter: Payer: Self-pay | Admitting: Surgery

## 2014-11-11 NOTE — Progress Notes (Signed)
Cardiothoracic Surgery Consultation   PCP is Gar Ponto, MD Referring Provider is Herminio Commons, MD  Chief Complaint  Patient presents with  . Chest Pain    discuss cardiac cath done on 09/19/14 by Dr. Fletcher Anon.  Continues to be evaulated by cardiology and being treated medically.    HPI:  The patient is a 64 year old gentleman who underwent CABG x 4 by me in 06/2009. He had a LIMA to the LAD, SVG to the diagonal, SVG to OM1, and a SVG to OM3. He presented in 12/2013 with unstable angina and was admitted with NSTEMI. Cardiac cath showed the SVG to OM3 to be occluded at its ostium. The SVG to the OM1 had mild plaque throughout with a 99% distal stenosis. The SVG to Diag had diffuse 40% mid stenosis and the LIMA to the LAD was widely patent. This was treated with a DES to the distal OM1 and PTCA of the mid AV groove LCX. He did well until November 2015 when he presented with a several day history of exertional angina progressing to nocturnal angina and ruled in for NSTEMI. The SVG to OM1 now had new 95% proximal stenosis and hazy disease in the midsegment with possible thrombus. The distal stent was patent. The SVG to the diagonal was occluded which was new. The patent LIMA to the LAD was supplying collaterals to the diagonal and the LCX distribution. Optimized medical therapy was recommended with long-acting NTG and Ranexa. He says that this has improved his symptoms but he is still very limited in what he can do before he gets chest pain. Cold weather and heavy meals bring on pain. He describes the pain as mild and relieved with rest. He denies rest angina.    Past Medical History  Diagnosis Date  . CAD (coronary artery disease)     a. 06/2009 Cath: 3VD, nondominant RCA;  b. 06/2009 CABG x 4: LIMA->LAD, VG->OM1, VG->OM3, VG->D1;  c. 03/2011 ETT: normal;  d. 12/2013 NSTEMI/Cath/PCI: LM nl, LAD 100p, LCX 100p, RCA nl, LIMA->LAD ok, VG->Diag 76m, VG->OM1 99d(3.5x18 Xience DES), VG->OM3 100d, EF  55%.  [name] HTN (hypertension)   . Hypercholesterolemia   . PUD (peptic ulcer disease)   . 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 GERD (gastroesophageal reflux disease)   . Osteoarthritis   . Horseshoe kidney     Past Surgical History  Procedure Laterality Date  . Other surgical history      BIL.CARPAL TUNNEL SURG.  . Rt.knee replacement    . Cardiac catheterization  CABG  . Kidney stone surgery    . Carpal tunnel release      bil  . Knee arthroscopy      rt x2, lft x3  . Coronary artery bypass graft  2010  . Hernia repair  2011    rt ing  . Total knee arthroplasty  02/11/2012    Procedure: TOTAL KNEE ARTHROPLASTY;  Surgeon: Rudean Haskell, MD;  Location: [facility];  Service: Orthopedics;  Laterality: Left;  left total knee replacement  . Left heart catheterization with coronary/graft angiogram N/A 12/28/2013    Procedure: LEFT HEART CATHETERIZATION WITH Beatrix Fetters;  Surgeon: Larey Dresser, MD;  Location: [facility] CATH LAB;  Service: Cardiovascular;  Laterality: N/A;  . Left heart catheterization with coronary/graft angiogram N/A 09/19/2014    Procedure: LEFT HEART CATHETERIZATION WITH Beatrix Fetters;  Surgeon: Wellington Hampshire, MD;  Location: [facility] CATH LAB;  Service: Cardiovascular;  Laterality: N/A;  Family History  Problem Relation Age of Onset  . COPD Father     AGE 71    Social History History  Substance Use Topics  . Smoking status: Never Smoker   . Smokeless tobacco: Never Used     Comment: occ beer  . Alcohol Use: 0.0 oz/week    0 Not specified per week     Comment: 07/22/14 Beer every now and thenDelos Haring    Current Outpatient Prescriptions  Medication Sig Dispense Refill  . acetaminophen (TYLENOL) 650 MG CR tablet Take 1,300 mg by mouth every morning.     [name] aspirin EC 81 MG EC tablet Take 1 tablet (81 mg total) by mouth daily.    [name] atorvastatin (LIPITOR) 80 MG tablet Take 1 tablet (80 mg total) by mouth daily at 6 PM. 30 tablet 6  . clopidogrel (PLAVIX)  75 MG tablet Take 1 tablet (75 mg total) by mouth daily with breakfast. 30 tablet 3  . diphenhydramine-acetaminophen (TYLENOL PM EXTRA STRENGTH) 25-500 MG TABS Take 1 tablet by mouth at bedtime as needed. For sleep    . fenofibrate 54 MG tablet Take 1 tablet (54 mg total) by mouth daily. 30 tablet 5  . gabapentin (NEURONTIN) 300 MG capsule Take 300-600 mg by mouth 2 (two) times daily. Take 300 mg in the morning and 600 mg at bedtime     . glucosamine-chondroitin 500-400 MG tablet Take 1 tablet by mouth 2 (two) times daily.     . isosorbide mononitrate (IMDUR) 30 MG 24 hr tablet Take 1 tablet (30 mg total) by mouth 2 (two) times daily. 60 tablet 6  . metoprolol tartrate (LOPRESSOR) 25 MG tablet Take 25 mg by mouth 2 (two) times daily.     . Multiple Vitamin (MULTIVITAMIN) tablet Take 1 tablet by mouth daily.      . nitroGLYCERIN (NITROSTAT) 0.4 MG SL tablet Place 1 tablet (0.4 mg total) under the tongue every 5 (five) minutes as needed for chest pain. 25 tablet 4  . potassium citrate (UROCIT-K) 5 MEQ (540 MG) SR tablet Take 5-10 mEq by mouth 2 (two) times daily. Take 2 tablets in the morning and 1 at night    . ranitidine (ZANTAC) 150 MG tablet take 1 tablet by mouth twice a day 60 tablet 4  . ranolazine (RANEXA) 1000 MG SR tablet Take 1 tablet (1,000 mg total) by mouth 2 (two) times daily. 60 tablet 5  . traMADol (ULTRAM) 50 MG tablet Take 50 mg by mouth 2 (two) times daily.     No current facility-administered medications for this visit.    No Known Allergies  Review of Systems  Constitutional: Positive for activity change. Negative for diaphoresis, appetite change and fatigue.  HENT: Negative.   Eyes: Negative.   Respiratory: Positive for chest tightness. Negative for shortness of breath.   Cardiovascular: Positive for chest pain. Negative for palpitations and leg swelling.  Gastrointestinal: Negative.   Endocrine: Negative.   Genitourinary: Positive for hematuria.  Musculoskeletal:  Negative.   Skin: Negative.   Allergic/Immunologic: Negative.   Neurological: Negative.   Hematological: Negative.   Psychiatric/Behavioral: Negative.     BP 100/65 mmHg  Pulse 67  Resp 16  Ht 5\' 9"  (1.753 m)  Wt 214 lb (97.07 kg)  BMI 31.59 kg/m2  SpO2 98% Physical Exam  Constitutional: He is oriented to person, place, and time. He appears well-developed and well-nourished.  HENT:  Head: Normocephalic and atraumatic.  Mouth/Throat: Oropharynx is clear and moist.  Eyes: EOM are normal. Pupils are equal, round, and reactive to light.  Neck: Normal range of motion. Neck supple. No JVD present. No thyromegaly present.  Cardiovascular: Normal rate, regular rhythm, normal heart sounds and intact distal pulses.   No murmur heard. Pulmonary/Chest: Effort normal and breath sounds normal. No respiratory distress.  Old sternotomy scar  Abdominal: Soft. Bowel sounds are normal. He exhibits no distension and no mass. There is no tenderness.  Musculoskeletal: Normal range of motion. He exhibits no edema or tenderness.  Lymphadenopathy:    He has no cervical adenopathy.  Neurological: He is alert and oriented to person, place, and time. He has normal strength. No cranial nerve deficit or sensory deficit.  Skin: Skin is warm and dry.  Bilateral wrist scars from carpal tunnel surgery  Bilateral leg incisions medial to knees from saphenous vein harvest  Psychiatric: He has a normal mood and affect.     Diagnostic Tests:  Cardiac Catheterization Procedure Note  Name: OSTEN JANEK MRN: [id number] DOB: 08/18/1951  Procedure: Left Heart Cath, Selective Coronary Angiography, SVG angiography, LIMA angiography, LV angiography  Indication: Non-ST elevation myocardial infarction with known history of coronary artery disease status post CABG and PCI on SVG to OM1 early this year.  Medications: Sedation: 2 mg IV Versed, 100 mcg IV  Fentanyl Contrast: 85 ML Omnipaque  Procedural details: The right groin was prepped, draped, and anesthetized with 1% lidocaine. Using modified Seldinger technique, a 6 French sheath was introduced into the right femoral artery. Standard Judkins catheters were used for coronary angiography and left ventriculography. A LIMA catheter was used over a long exchange wire. Catheter exchanges were performed over a guidewire. There were no immediate procedural complications. The patient was transferred to the post catheterization recovery area for further monitoring.  Procedural Findings:  Hemodynamics: AO: 107/61 mmHg LV: 112/6 mmHg LVEDP: 12 mmHg  Coronary angiography: Coronary dominance: Left   Left Main: 70% distal stenosis.  Left Anterior Descending (LAD): Occluded proximally  Circumflex (LCx): Occluded proximally  Right Coronary Artery: Nondominant with 70-80% proximal disease which is unchanged from before.  SVG to OM 3: Occluded which is not a new finding.  SVG to OM1: Patent with 95% proximal stenosis and hazy disease in the midsegment with possible thrombus. The distal stent is patent with 20% in-stent restenosis. This mainly supplies OM1 and is no longer supplying the posterior AV groove artery retrograde.  SVG to diagonal: Occluded which is a new finding.  LIMA to LAD: Patent with no significant disease. This gives reasonable collaterals to the whole left circumflex distribution as well as the diagonal branches.  Left ventriculography: Left ventricular systolic function is normal , LVEF is estimated at 55 %, there is no significant mitral regurgitation   Final Conclusions:  1. Severe underlying three-vessel coronary artery disease. Patent LIMA to LAD which supplies collaterals to the diagonal and left circumflex distribution. The SVG to diagonal is now occluded which is a new finding. SVG to OM 3  is chronically occluded. SVG to OM1 has new severe disease proximally with possible thrombus in the midsegment. 2. Normal LV systolic function and left ventricular end-diastolic pressure.  Recommendations: This is a difficult situation. PCI on SVG to OM1 is possible. However, the graft is supplying a relatively small OM1 distribution. It has severe disease proximally and possible thrombus in the midsegment. Thus, the risks probably outweigh the benefits. I recommend optimizing medical therapy. I resumed long-acting nitroglycerin and added Ranexa. If we are not able  to control angina medically, redo CABG should be considered for symptom relief. There is probably no mortality benefit from redo CABG given that his LIMA is widely patent.  Kathlyn Sacramento MD, [facility] 09/19/2014, 2:15 PM     Impression:  He has 70% distal left main stenosis with occluded LAD and LCX arteries and 70-80% proximal nondominant RCA stenosis. He has a large patent LIMA supplying the LAD as well as collaterals to the LCX territory and the diagonal. The diagonal does not fill very well and I can't tell if it would be graftable. The OM1 graft is severely diseased but the distal vessel may be graftable. The OM3 is a large vessel filling by collaterals and communicates with a moderate OM2. He is having lifestyle-limiting chest pain symptoms at this time on optimized medical therapy. I agree that redo CABG may not have a survival advantage with a patent LIMA supplying so much territory but it may resolve his anginal symptoms. I think the OM1 and OM3 are graftable, and possibly the diagonal. I think there would be adequate conduit. The right GSV was not used because when we examined it adjacent to the knee it was small. The left GSV was removed for surgery. Review of his prior arterial dopplers from 2010 show that both the left and right radial arteries could be removed. His right IMA could also be used if it is patent. I reviewed the  cath films with him and the possibility of redo CABG. All of his current questions have been answered.   Plan:  He will think about redo CABG and let me know if he wants to proceed.  I spent 80 minutes performing this consultation and > 50% of this time was spent face to face counseling and coordinating the care of this patient's coronary disease.

## 2014-11-15 ENCOUNTER — Other Ambulatory Visit: Payer: Self-pay | Admitting: *Deleted

## 2014-11-15 DIAGNOSIS — I2510 Atherosclerotic heart disease of native coronary artery without angina pectoris: Secondary | ICD-10-CM

## 2014-12-28 ENCOUNTER — Ambulatory Visit: Payer: 59 | Admitting: Cardiovascular Disease

## 2014-12-29 ENCOUNTER — Ambulatory Visit (INDEPENDENT_AMBULATORY_CARE_PROVIDER_SITE_OTHER): Payer: 59 | Admitting: Surgery

## 2014-12-29 ENCOUNTER — Ambulatory Visit: Payer: Medicare Other | Admitting: Surgery

## 2014-12-29 ENCOUNTER — Ambulatory Visit (HOSPITAL_COMMUNITY)
Admission: RE | Admit: 2014-12-29 | Discharge: 2014-12-29 | Disposition: A | Discharge status: Home / Self Care | Payer: Medicare Other | Source: Ambulatory Visit | Attending: Surgery | Admitting: Surgery

## 2014-12-29 ENCOUNTER — Other Ambulatory Visit: Payer: Self-pay | Admitting: *Deleted

## 2014-12-29 ENCOUNTER — Encounter: Payer: Self-pay | Admitting: Surgery

## 2014-12-29 DIAGNOSIS — I2510 Atherosclerotic heart disease of native coronary artery without angina pectoris: Secondary | ICD-10-CM

## 2014-12-29 DIAGNOSIS — I25700 Atherosclerosis of coronary artery bypass graft(s), unspecified, with unstable angina pectoris: Secondary | ICD-10-CM

## 2014-12-29 DIAGNOSIS — Z01818 Encounter for other preprocedural examination: Secondary | ICD-10-CM | POA: Insufficient documentation

## 2014-12-29 NOTE — Progress Notes (Signed)
Right Lower Extremity Vein Map    Right Great Saphenous Vein   Segment Diameter Comment  1. Origin 5.59mm   2. High Thigh 2.27mm   3. Mid Thigh 2.32mm Branch  4. Low Thigh 3.66mm   5. At Knee 3.58mm Branch  6. High Calf 2.66mm   7. Low Calf 2.26mm Branch  8. Ankle 2.19mm                 Right Small Saphenous Vein  Segment Diameter Comment  1. Origin 2.43mm   2. High Calf 2.51mm   3. Low Calf 3.36mm   4. Ankle 3.38mm                  Vascular Ultrasound Bilateral limited Upper Extremity Arterial Duplex has been completed.   Bilateral radial and ulnar arteries are patent with triphasic flow. Allen's test: Bilateral signals are unaffected with radial compression, obliterate with ulnar compression.  12/29/2014 10:12 AM Maudry Mayhew, RVT, RDCS, RDMS

## 2014-12-29 NOTE — Anesthesia Preprocedure Evaluation (Addendum)
Anesthesia Evaluation  Patient identified by MRN, date of birth, ID band Patient awake    Reviewed: Allergy & Precautions, NPO status , Patient's Chart, lab work & pertinent test results, reviewed documented beta blocker date and time   Airway Mallampati: II  TM Distance: >3 FB Neck ROM: Full    Dental  (+) Teeth Intact, Dental Advisory Given   Pulmonary  breath sounds clear to auscultation        Cardiovascular hypertension, Pt. on medications and Pt. on home beta blockers + angina + CAD and + Past MI + Valvular Problems/Murmurs Rhythm:Regular Rate:Normal     Neuro/Psych    GI/Hepatic PUD, GERD-  ,  Endo/Other    Renal/GU Renal disease     Musculoskeletal  (+) Arthritis -,   Abdominal   Peds  Hematology   Anesthesia Other Findings   Reproductive/Obstetrics                         Anesthesia Physical Anesthesia Plan  ASA: III  Anesthesia Plan: General   Post-op Pain Management:    Induction: Intravenous  Airway Management Planned: Oral ETT  Additional Equipment: Arterial line, CVP, PA Cath and TEE  Intra-op Plan:   Post-operative Plan: Post-operative intubation/ventilation  Informed Consent: I have reviewed the patients History and Physical, chart, labs and discussed the procedure including the risks, benefits and alternatives for the proposed anesthesia with the patient or authorized representative who has indicated his/her understanding and acceptance.   Dental advisory given  Plan Discussed with: CRNA, Anesthesiologist and Surgeon  Anesthesia Plan Comments: (PER [facility]: Dr. Cyndia Bent REQUESTED [facility]. NO IV'S IN EITHER ARM.  Myra Gianotti, PA-C)      Anesthesia Quick Evaluation

## 2014-12-30 ENCOUNTER — Encounter: Payer: Self-pay | Admitting: Surgery

## 2014-12-30 NOTE — Progress Notes (Signed)
HPI:  The patient and his wife return today to review the results of his lower extremity vein mapping and upper extremity arterial doppler exam in anticipation of redo CABG. He continues to have angina with minimal activity and is taking NTG almost every day. His arterial dopplers show that both hands are dependent on the ulnar artery so the radial arteries could be used. The vein mapping does show some greater saphenous vein in the right leg that is on the small side. It was noted to be small at the time of his surgery in 2010 and was not used.   Current Outpatient Prescriptions  Medication Sig Dispense Refill  . acetaminophen (TYLENOL) 650 MG CR tablet Take 1,300 mg by mouth every morning.     [name] aspirin EC 81 MG EC tablet Take 1 tablet (81 mg total) by mouth daily.    [name] atorvastatin (LIPITOR) 80 MG tablet Take 1 tablet (80 mg total) by mouth daily at 6 PM. 30 tablet 6  . clopidogrel (PLAVIX) 75 MG tablet Take 1 tablet (75 mg total) by mouth daily with breakfast. 30 tablet 3  . diphenhydramine-acetaminophen (TYLENOL PM EXTRA STRENGTH) 25-500 MG TABS Take 1 tablet by mouth at bedtime as needed. For sleep    . fenofibrate 54 MG tablet Take 1 tablet (54 mg total) by mouth daily. 30 tablet 5  . gabapentin (NEURONTIN) 300 MG capsule Take 300-600 mg by mouth 2 (two) times daily. Take 300 mg in the morning and 600 mg at bedtime     . glucosamine-chondroitin 500-400 MG tablet Take 1 tablet by mouth 2 (two) times daily.     . isosorbide mononitrate (IMDUR) 30 MG 24 hr tablet Take 1 tablet (30 mg total) by mouth 2 (two) times daily. 60 tablet 6  . metoprolol tartrate (LOPRESSOR) 25 MG tablet Take 25 mg by mouth 2 (two) times daily.     . Multiple Vitamin (MULTIVITAMIN) tablet Take 1 tablet by mouth daily.      . nitroGLYCERIN (NITROSTAT) 0.4 MG SL tablet Place 1 tablet (0.4 mg total) under the tongue every 5 (five) minutes as needed for chest pain. 25 tablet 4  . potassium citrate (UROCIT-K) 5 MEQ  (540 MG) SR tablet Take 5-10 mEq by mouth 2 (two) times daily. Take 2 tablets in the morning and 1 at night    . ranitidine (ZANTAC) 150 MG tablet take 1 tablet by mouth twice a day 60 tablet 4  . ranolazine (RANEXA) 1000 MG SR tablet Take 1 tablet (1,000 mg total) by mouth 2 (two) times daily. 60 tablet 5  . traMADol (ULTRAM) 50 MG tablet Take 50 mg by mouth 2 (two) times daily.     No current facility-administered medications for this visit.     Physical Exam: BP 113/65 mmHg  Pulse 59  Resp 16  Ht 5\' 9"  (1.753 m)  Wt 214 lb (97.07 kg)  BMI 31.59 kg/m2  SpO2 98% He looks well Cardiac exam shows a regular rate and rhythm with normal heart sounds Lung exam is clear.  Diagnostic Tests:  I have personally reviewed all of his cardiac cath films, arterial doppler study and vein mapping of the legs.  Impression:   He has 70% distal left main stenosis with occluded LAD and LCX arteries and 70-80% proximal nondominant RCA stenosis. He has a large patent LIMA supplying the LAD as well as collaterals to the LCX territory and the diagonal. The OM1 and OM3 vessels appear  graftable and the OM3 communicates with a moderate OM2. The diagonal may be graftable. It looked larger on prior caths before the vein graft occluded. He is having lifestyle-limiting chest pain symptoms at this time on optimized medical therapy. I agree that redo CABG may not have a survival advantage with a patent LIMA supplying so much territory but it should resolve his anginal symptoms and improve his quality of life. I would plan to use both radial arteries and the RIMA if they are adequate since he is only 28 and this is probably his last surgery. The vein grafts did not last long. I discussed the operative procedure with the patient and his wife including alternatives, benefits and risks; including but not limited to bleeding, blood transfusion, infection, stroke, myocardial infarction, graft failure, heart block requiring a  permanent pacemaker, organ dysfunction, and death.  [name] understands and agrees to proceed.  We will schedule surgery for April 7. He will stop his Plavix 10 days before but will continue the ASA.  Plan:  Redo CABG using bilateral radial arteries and a RIMA on 01/27/2015.   [name], MD [facility] [phone]

## 2015-01-25 ENCOUNTER — Ambulatory Visit (HOSPITAL_COMMUNITY)
Admission: RE | Admit: 2015-01-25 | Discharge: 2015-01-25 | Disposition: A | Discharge status: Home / Self Care | Payer: Medicare Other | Source: Ambulatory Visit | Attending: Surgery | Admitting: Surgery

## 2015-01-25 ENCOUNTER — Encounter (HOSPITAL_COMMUNITY)
Admission: RE | Admit: 2015-01-25 | Discharge: 2015-01-25 | Disposition: A | Discharge status: Home / Self Care | Payer: Medicare Other | Source: Ambulatory Visit | Attending: Surgery | Admitting: Surgery

## 2015-01-25 ENCOUNTER — Encounter (HOSPITAL_COMMUNITY): Payer: Self-pay

## 2015-01-25 ENCOUNTER — Other Ambulatory Visit (HOSPITAL_COMMUNITY): Payer: Self-pay | Admitting: *Deleted

## 2015-01-25 VITALS — BP 117/69 | HR 67 | Temp 97.6°F | Resp 20 | Ht 68.0 in | Wt 216.5 lb

## 2015-01-25 DIAGNOSIS — Z96653 Presence of artificial knee joint, bilateral: Secondary | ICD-10-CM | POA: Diagnosis present

## 2015-01-25 DIAGNOSIS — I2510 Atherosclerotic heart disease of native coronary artery without angina pectoris: Secondary | ICD-10-CM

## 2015-01-25 DIAGNOSIS — Z7982 Long term (current) use of aspirin: Secondary | ICD-10-CM

## 2015-01-25 DIAGNOSIS — R0609 Other forms of dyspnea: Secondary | ICD-10-CM | POA: Insufficient documentation

## 2015-01-25 DIAGNOSIS — E8770 Fluid overload, unspecified: Secondary | ICD-10-CM | POA: Diagnosis not present

## 2015-01-25 DIAGNOSIS — Z79899 Other long term (current) drug therapy: Secondary | ICD-10-CM

## 2015-01-25 DIAGNOSIS — Z7902 Long term (current) use of antithrombotics/antiplatelets: Secondary | ICD-10-CM

## 2015-01-25 DIAGNOSIS — K5900 Constipation, unspecified: Secondary | ICD-10-CM | POA: Diagnosis present

## 2015-01-25 DIAGNOSIS — Z01810 Encounter for preprocedural cardiovascular examination: Secondary | ICD-10-CM | POA: Diagnosis not present

## 2015-01-25 DIAGNOSIS — I4510 Unspecified right bundle-branch block: Secondary | ICD-10-CM | POA: Diagnosis present

## 2015-01-25 DIAGNOSIS — D696 Thrombocytopenia, unspecified: Secondary | ICD-10-CM | POA: Diagnosis present

## 2015-01-25 DIAGNOSIS — Z01818 Encounter for other preprocedural examination: Secondary | ICD-10-CM

## 2015-01-25 DIAGNOSIS — K219 Gastro-esophageal reflux disease without esophagitis: Secondary | ICD-10-CM | POA: Diagnosis present

## 2015-01-25 DIAGNOSIS — T82898A Other specified complication of vascular prosthetic devices, implants and grafts, initial encounter: Secondary | ICD-10-CM | POA: Diagnosis present

## 2015-01-25 DIAGNOSIS — E119 Type 2 diabetes mellitus without complications: Secondary | ICD-10-CM | POA: Diagnosis present

## 2015-01-25 DIAGNOSIS — I252 Old myocardial infarction: Secondary | ICD-10-CM

## 2015-01-25 DIAGNOSIS — E780 Pure hypercholesterolemia: Secondary | ICD-10-CM | POA: Diagnosis present

## 2015-01-25 DIAGNOSIS — D62 Acute posthemorrhagic anemia: Secondary | ICD-10-CM | POA: Diagnosis not present

## 2015-01-25 DIAGNOSIS — I10 Essential (primary) hypertension: Secondary | ICD-10-CM | POA: Diagnosis present

## 2015-01-25 DIAGNOSIS — M1990 Unspecified osteoarthritis, unspecified site: Secondary | ICD-10-CM | POA: Diagnosis present

## 2015-01-25 HISTORY — DX: Cardiac murmur, unspecified: R01.1

## 2015-01-25 LAB — URINALYSIS, ROUTINE W REFLEX MICROSCOPIC
GLUCOSE, UA: NEGATIVE mg/dL
Ketones, ur: NEGATIVE mg/dL
Nitrite: NEGATIVE
PH: 5.5 (ref 5.0–8.0)
Protein, ur: 100 mg/dL — AB
Specific Gravity, Urine: 1.023 (ref 1.005–1.030)
Urobilinogen, UA: 0.2 mg/dL (ref 0.0–1.0)

## 2015-01-25 LAB — PULMONARY FUNCTION TEST
DL/VA % pred: 77 %
DL/VA: 3.46 ml/min/mmHg/L
DLCO unc % pred: 67 %
DLCO unc: 20.05 ml/min/mmHg
FEF 25-75 PRE: 2.73 L/s
FEF 25-75 Post: 3 L/sec
FEF2575-%Change-Post: 9 %
FEF2575-%Pred-Post: 116 %
FEF2575-%Pred-Pre: 106 %
FEV1-%CHANGE-POST: 2 %
FEV1-%PRED-PRE: 97 %
FEV1-%Pred-Post: 99 %
FEV1-POST: 3.19 L
FEV1-Pre: 3.13 L
FEV1FVC-%Change-Post: 7 %
FEV1FVC-%Pred-Pre: 104 %
FEV6-%Change-Post: -4 %
FEV6-%Pred-Post: 92 %
FEV6-%Pred-Pre: 96 %
FEV6-POST: 3.75 L
FEV6-PRE: 3.93 L
FEV6FVC-%Change-Post: 0 %
FEV6FVC-%PRED-PRE: 103 %
FEV6FVC-%Pred-Post: 104 %
FVC-%Change-Post: -4 %
FVC-%PRED-PRE: 92 %
FVC-%Pred-Post: 88 %
FVC-Post: 3.79 L
FVC-Pre: 3.99 L
POST FEV1/FVC RATIO: 84 %
POST FEV6/FVC RATIO: 99 %
PRE FEV6/FVC RATIO: 99 %
Pre FEV1/FVC ratio: 78 %
RV % PRED: 106 %
RV: 2.37 L
TLC % PRED: 99 %
TLC: 6.59 L

## 2015-01-25 LAB — SURGICAL PCR SCREEN
MRSA, PCR: NEGATIVE
Staphylococcus aureus: NEGATIVE

## 2015-01-25 LAB — COMPREHENSIVE METABOLIC PANEL
ALT: 20 U/L (ref 0–53)
AST: 25 U/L (ref 0–37)
Albumin: 3.9 g/dL (ref 3.5–5.2)
Alkaline Phosphatase: 52 U/L (ref 39–117)
Anion gap: 8 (ref 5–15)
BUN: 21 mg/dL (ref 6–23)
CHLORIDE: 108 mmol/L (ref 96–112)
CO2: 21 mmol/L (ref 19–32)
Calcium: 9.3 mg/dL (ref 8.4–10.5)
Creatinine, Ser: 1.37 mg/dL — ABNORMAL HIGH (ref 0.50–1.35)
GFR calc non Af Amer: 53 mL/min — ABNORMAL LOW (ref >90)
GFR, EST AFRICAN AMERICAN: 61 mL/min — AB (ref >90)
GLUCOSE: 97 mg/dL (ref 70–99)
POTASSIUM: 4.6 mmol/L (ref 3.5–5.1)
SODIUM: 137 mmol/L (ref 135–145)
Total Bilirubin: 1 mg/dL (ref 0.3–1.2)
Total Protein: 6.8 g/dL (ref 6.0–8.3)

## 2015-01-25 LAB — CBC
HEMATOCRIT: 39.4 % (ref 39.0–52.0)
Hemoglobin: 13.5 g/dL (ref 13.0–17.0)
MCH: 31.7 pg (ref 26.0–34.0)
MCHC: 34.3 g/dL (ref 30.0–36.0)
MCV: 92.5 fL (ref 78.0–100.0)
Platelets: 223 10*3/uL (ref 150–400)
RBC: 4.26 MIL/uL (ref 4.22–5.81)
RDW: 14 % (ref 11.5–15.5)
WBC: 6 10*3/uL (ref 4.0–10.5)

## 2015-01-25 LAB — BLOOD GAS, ARTERIAL
ACID-BASE DEFICIT: 1 mmol/L (ref 0.0–2.0)
Bicarbonate: 22.7 mEq/L (ref 20.0–24.0)
DRAWN BY: 42180
FIO2: 0.21 %
O2 Saturation: 97.4 %
PATIENT TEMPERATURE: 98.6
TCO2: 23.7 mmol/L (ref 0–100)
pCO2 arterial: 34.5 mmHg — ABNORMAL LOW (ref 35.0–45.0)
pH, Arterial: 7.433 (ref 7.350–7.450)
pO2, Arterial: 108 mmHg — ABNORMAL HIGH (ref 80.0–100.0)

## 2015-01-25 LAB — URINE MICROSCOPIC-ADD ON

## 2015-01-25 LAB — PROTIME-INR
INR: 1.05 (ref 0.00–1.49)
PROTHROMBIN TIME: 13.8 s (ref 11.6–15.2)

## 2015-01-25 LAB — APTT: APTT: 35 s (ref 24–37)

## 2015-01-25 MED ORDER — ALBUTEROL SULFATE (2.5 MG/3ML) 0.083% IN NEBU
2.5000 mg | INHALATION_SOLUTION | Freq: Once | RESPIRATORY_TRACT | Status: AC
  Administered 2015-01-25: 2.5 mg via RESPIRATORY_TRACT

## 2015-01-25 NOTE — Pre-Procedure Instructions (Signed)
Ricardo Jimenez  01/25/2015   Your procedure is scheduled on:  Thursday, January 27, 2015 at 7:30 AM.   Report to [facility] Entrance "A" Admitting Office at 5:30 AM.   Call this number if you have problems the morning of surgery: [phone]   Remember:   Do not eat food or drink liquids after midnight Wednesday, 01/26/15.   Take these medicines the morning of surgery with A SIP OF WATER:  Isosorbide Mononitrate (Imdur), Metoprolol Tartrate (Lopressor), Ranolazine (Ranexa), Tramadol, Ranitidine (Zantac)   Do not wear jewelry.  Do not wear lotions, powders, or cologne. You may NOT wear deodorant.  Men may shave face and neck.  Do not bring valuables to the hospital.  [facility] is not responsible                  for any belongings or valuables.               Contacts, dentures or bridgework may not be worn into surgery.  Leave suitcase in the car. After surgery it may be brought to your room.  For patients admitted to the hospital, discharge time is determined by your                treatment team.               Special Instructions: [place] - Preparing for Surgery  Before surgery, you can play an important role.  Because skin is not sterile, your skin needs to be as free of germs as possible.  You can reduce the number of germs on you skin by washing with CHG (chlorahexidine gluconate) soap before surgery.  CHG is an antiseptic cleaner which kills germs and bonds with the skin to continue killing germs even after washing.  Please DO NOT use if you have an allergy to CHG or antibacterial soaps.  If your skin becomes reddened/irritated stop using the CHG and inform your nurse when you arrive at Short Stay.  Do not shave (including legs and underarms) for at least 48 hours prior to the first CHG shower.  You may shave your face.  Please follow these instructions carefully:   1.  Shower with CHG Soap the night before surgery and the                                morning of  Surgery.  2.  If you choose to wash your hair, wash your hair first as usual with your       normal shampoo.  3.  After you shampoo, rinse your hair and body thoroughly to remove the                      Shampoo.  4.  Use CHG as you would any other liquid soap.  You can apply chg directly       to the skin and wash gently with scrungie or a clean washcloth.  5.  Apply the CHG Soap to your body ONLY FROM THE NECK DOWN.        Do not use on open wounds or open sores.  Avoid contact with your eyes, ears, mouth and genitals (private parts).  Wash genitals (private parts) with your normal soap.  6.  Wash thoroughly, paying special attention to the area where your surgery        will be performed.  7.  Thoroughly rinse your body with warm water from the neck down.  8.  DO NOT shower/wash with your normal soap after using and rinsing off       the CHG Soap.  9.  Pat yourself dry with a clean towel.            10.  Wear clean pajamas.            11.  Place clean sheets on your bed the night of your first shower and do not        sleep with pets.  Day of Surgery  Do not apply any lotions/deodorants the morning of surgery.  Please wear clean clothes to the hospital.     Please read over the following fact sheets that you were given: Pain Booklet, Coughing and Deep Breathing, Blood Transfusion Information, Open Heart Packet, MRSA Information and Surgical Site Infection Prevention

## 2015-01-25 NOTE — Progress Notes (Signed)
*  PRELIMINARY RESULTS* Vascular Ultrasound Carotid Duplex (Doppler) has been completed.   Findings suggest 1-39% internal carotid artery stenosis bilaterally. Vertebral arteries are patent with antegrade flow.  01/25/2015 4:16 PM Maudry Mayhew, RVT, RDCS, RDMS

## 2015-01-26 ENCOUNTER — Encounter (HOSPITAL_COMMUNITY): Payer: Self-pay | Admitting: Certified Registered Nurse Anesthetist

## 2015-01-26 LAB — HEMOGLOBIN A1C
HEMOGLOBIN A1C: 5.6 % (ref 4.8–5.6)
Mean Plasma Glucose: 114 mg/dL

## 2015-01-26 MED ORDER — DEXTROSE 5 % IV SOLN
30.0000 ug/min | INTRAVENOUS | Status: DC
  Filled 2015-01-26: qty 2

## 2015-01-26 MED ORDER — DOPAMINE-DEXTROSE 3.2-5 MG/ML-% IV SOLN
0.0000 ug/kg/min | INTRAVENOUS | Status: DC
  Filled 2015-01-26: qty 250

## 2015-01-26 MED ORDER — DEXTROSE 5 % IV SOLN
1.5000 g | INTRAVENOUS | Status: AC
  Administered 2015-01-27: .75 g via INTRAVENOUS
  Administered 2015-01-27: 1.5 g via INTRAVENOUS
  Filled 2015-01-26: qty 1.5

## 2015-01-26 MED ORDER — SODIUM CHLORIDE 0.9 % IV SOLN
INTRAVENOUS | Status: DC
  Filled 2015-01-26: qty 2.5

## 2015-01-26 MED ORDER — EPINEPHRINE HCL 1 MG/ML IJ SOLN
0.0000 ug/min | INTRAVENOUS | Status: DC
  Filled 2015-01-26: qty 4

## 2015-01-26 MED ORDER — POTASSIUM CHLORIDE 2 MEQ/ML IV SOLN
80.0000 meq | INTRAVENOUS | Status: DC
  Filled 2015-01-26: qty 40

## 2015-01-26 MED ORDER — VANCOMYCIN HCL 10 G IV SOLR
1500.0000 mg | INTRAVENOUS | Status: AC
  Administered 2015-01-27: 1500 mg via INTRAVENOUS
  Filled 2015-01-26 (×2): qty 1500

## 2015-01-26 MED ORDER — METOPROLOL TARTRATE 12.5 MG HALF TABLET: 12.5000 mg | ORAL_TABLET | Freq: Once | ORAL | Status: DC

## 2015-01-26 MED ORDER — DEXTROSE 5 % IV SOLN
750.0000 mg | INTRAVENOUS | Status: DC
  Filled 2015-01-26: qty 750

## 2015-01-26 MED ORDER — SODIUM CHLORIDE 0.9 % IV SOLN
INTRAVENOUS | Status: DC
  Filled 2015-01-26: qty 30

## 2015-01-26 MED ORDER — DEXMEDETOMIDINE HCL IN NACL 400 MCG/100ML IV SOLN
0.1000 ug/kg/h | INTRAVENOUS | Status: DC
  Filled 2015-01-26 (×2): qty 100

## 2015-01-26 MED ORDER — MAGNESIUM SULFATE 50 % IJ SOLN
40.0000 meq | INTRAMUSCULAR | Status: DC
  Filled 2015-01-26: qty 10

## 2015-01-26 MED ORDER — SODIUM CHLORIDE 0.9 % IV SOLN
INTRAVENOUS | Status: DC
  Filled 2015-01-26: qty 40

## 2015-01-26 MED ORDER — NITROGLYCERIN IN D5W 200-5 MCG/ML-% IV SOLN
2.0000 ug/min | INTRAVENOUS | Status: DC
  Filled 2015-01-26: qty 250

## 2015-01-26 MED ORDER — CHLORHEXIDINE GLUCONATE 4 % EX LIQD
30.0000 mL | CUTANEOUS | Status: DC
  Filled 2015-01-26: qty 30

## 2015-01-26 MED ORDER — PLASMA-LYTE 148 IV SOLN
INTRAVENOUS | Status: AC
  Administered 2015-01-27: 500 mL
  Filled 2015-01-26: qty 2.5

## 2015-01-27 ENCOUNTER — Inpatient Hospital Stay (HOSPITAL_COMMUNITY): Payer: Medicare Other | Admitting: Anesthesiology

## 2015-01-27 ENCOUNTER — Encounter (HOSPITAL_COMMUNITY)
Admission: RE | Disposition: A | Discharge status: Home / Self Care | Payer: Medicare Other | Source: Ambulatory Visit | Attending: Surgery

## 2015-01-27 ENCOUNTER — Inpatient Hospital Stay (HOSPITAL_COMMUNITY): Payer: Medicare Other

## 2015-01-27 ENCOUNTER — Inpatient Hospital Stay (HOSPITAL_COMMUNITY)
Admission: RE | Admit: 2015-01-27 | Discharge: 2015-02-01 | DRG: 236 | Disposition: A | Discharge status: Home / Self Care | Payer: Medicare Other | Source: Ambulatory Visit | Attending: Surgery | Admitting: Surgery

## 2015-01-27 ENCOUNTER — Encounter (HOSPITAL_COMMUNITY): Payer: Self-pay | Admitting: *Deleted

## 2015-01-27 ENCOUNTER — Inpatient Hospital Stay (HOSPITAL_COMMUNITY): Payer: Medicare Other | Admitting: Vascular Surgery

## 2015-01-27 DIAGNOSIS — I2510 Atherosclerotic heart disease of native coronary artery without angina pectoris: Secondary | ICD-10-CM

## 2015-01-27 DIAGNOSIS — Z7982 Long term (current) use of aspirin: Secondary | ICD-10-CM | POA: Diagnosis not present

## 2015-01-27 DIAGNOSIS — Z96653 Presence of artificial knee joint, bilateral: Secondary | ICD-10-CM | POA: Diagnosis present

## 2015-01-27 DIAGNOSIS — K219 Gastro-esophageal reflux disease without esophagitis: Secondary | ICD-10-CM | POA: Diagnosis present

## 2015-01-27 DIAGNOSIS — K5900 Constipation, unspecified: Secondary | ICD-10-CM | POA: Diagnosis present

## 2015-01-27 DIAGNOSIS — E780 Pure hypercholesterolemia: Secondary | ICD-10-CM | POA: Diagnosis present

## 2015-01-27 DIAGNOSIS — T82898A Other specified complication of vascular prosthetic devices, implants and grafts, initial encounter: Secondary | ICD-10-CM | POA: Diagnosis present

## 2015-01-27 DIAGNOSIS — I10 Essential (primary) hypertension: Secondary | ICD-10-CM | POA: Diagnosis present

## 2015-01-27 DIAGNOSIS — E119 Type 2 diabetes mellitus without complications: Secondary | ICD-10-CM | POA: Diagnosis present

## 2015-01-27 DIAGNOSIS — R079 Chest pain, unspecified: Secondary | ICD-10-CM | POA: Diagnosis present

## 2015-01-27 DIAGNOSIS — I25110 Atherosclerotic heart disease of native coronary artery with unstable angina pectoris: Secondary | ICD-10-CM | POA: Diagnosis not present

## 2015-01-27 DIAGNOSIS — M1990 Unspecified osteoarthritis, unspecified site: Secondary | ICD-10-CM | POA: Diagnosis present

## 2015-01-27 DIAGNOSIS — Z79899 Other long term (current) drug therapy: Secondary | ICD-10-CM | POA: Diagnosis not present

## 2015-01-27 DIAGNOSIS — E8770 Fluid overload, unspecified: Secondary | ICD-10-CM | POA: Diagnosis not present

## 2015-01-27 DIAGNOSIS — D696 Thrombocytopenia, unspecified: Secondary | ICD-10-CM | POA: Diagnosis present

## 2015-01-27 DIAGNOSIS — I4510 Unspecified right bundle-branch block: Secondary | ICD-10-CM | POA: Diagnosis present

## 2015-01-27 DIAGNOSIS — D62 Acute posthemorrhagic anemia: Secondary | ICD-10-CM | POA: Diagnosis not present

## 2015-01-27 DIAGNOSIS — Z951 Presence of aortocoronary bypass graft: Secondary | ICD-10-CM

## 2015-01-27 DIAGNOSIS — I252 Old myocardial infarction: Secondary | ICD-10-CM | POA: Diagnosis not present

## 2015-01-27 DIAGNOSIS — Z7902 Long term (current) use of antithrombotics/antiplatelets: Secondary | ICD-10-CM | POA: Diagnosis not present

## 2015-01-27 HISTORY — PX: CORONARY ARTERY BYPASS GRAFT: SHX141

## 2015-01-27 HISTORY — PX: RADIAL ARTERY HARVEST: SHX5067

## 2015-01-27 HISTORY — PX: TEE WITHOUT CARDIOVERSION: SHX5443

## 2015-01-27 LAB — POCT I-STAT, CHEM 8
BUN: 24 mg/dL — AB (ref 6–23)
BUN: 22 mg/dL (ref 6–23)
BUN: 19 mg/dL (ref 6–23)
BUN: 20 mg/dL (ref 6–23)
BUN: 20 mg/dL (ref 6–23)
BUN: 22 mg/dL (ref 6–23)
CALCIUM ION: 1.22 mmol/L (ref 1.13–1.30)
CALCIUM ION: 1.03 mmol/L — AB (ref 1.13–1.30)
CALCIUM ION: 1.1 mmol/L — AB (ref 1.13–1.30)
CALCIUM ION: 1.11 mmol/L — AB (ref 1.13–1.30)
CHLORIDE: 104 mmol/L (ref 96–112)
CHLORIDE: 102 mmol/L (ref 96–112)
CREATININE: 1.2 mg/dL (ref 0.50–1.35)
Calcium, Ion: 1.23 mmol/L (ref 1.13–1.30)
Calcium, Ion: 1.08 mmol/L — ABNORMAL LOW (ref 1.13–1.30)
Chloride: 104 mmol/L (ref 96–112)
Chloride: 102 mmol/L (ref 96–112)
Chloride: 103 mmol/L (ref 96–112)
Chloride: 104 mmol/L (ref 96–112)
Creatinine, Ser: 1.4 mg/dL — ABNORMAL HIGH (ref 0.50–1.35)
Creatinine, Ser: 1.2 mg/dL (ref 0.50–1.35)
Creatinine, Ser: 1 mg/dL (ref 0.50–1.35)
Creatinine, Ser: 1.1 mg/dL (ref 0.50–1.35)
Creatinine, Ser: 1.2 mg/dL (ref 0.50–1.35)
GLUCOSE: 112 mg/dL — AB (ref 70–99)
Glucose, Bld: 106 mg/dL — ABNORMAL HIGH (ref 70–99)
Glucose, Bld: 98 mg/dL (ref 70–99)
Glucose, Bld: 151 mg/dL — ABNORMAL HIGH (ref 70–99)
Glucose, Bld: 151 mg/dL — ABNORMAL HIGH (ref 70–99)
Glucose, Bld: 146 mg/dL — ABNORMAL HIGH (ref 70–99)
HCT: 31 % — ABNORMAL LOW (ref 39.0–52.0)
HCT: 27 % — ABNORMAL LOW (ref 39.0–52.0)
HCT: 22 % — ABNORMAL LOW (ref 39.0–52.0)
HCT: 23 % — ABNORMAL LOW (ref 39.0–52.0)
HCT: 24 % — ABNORMAL LOW (ref 39.0–52.0)
HEMATOCRIT: 27 % — AB (ref 39.0–52.0)
HEMOGLOBIN: 9.2 g/dL — AB (ref 13.0–17.0)
HEMOGLOBIN: 8.2 g/dL — AB (ref 13.0–17.0)
Hemoglobin: 10.5 g/dL — ABNORMAL LOW (ref 13.0–17.0)
Hemoglobin: 7.5 g/dL — ABNORMAL LOW (ref 13.0–17.0)
Hemoglobin: 7.8 g/dL — ABNORMAL LOW (ref 13.0–17.0)
Hemoglobin: 9.2 g/dL — ABNORMAL LOW (ref 13.0–17.0)
POTASSIUM: 3.7 mmol/L (ref 3.5–5.1)
Potassium: 4.3 mmol/L (ref 3.5–5.1)
Potassium: 3.7 mmol/L (ref 3.5–5.1)
Potassium: 4.3 mmol/L (ref 3.5–5.1)
Potassium: 4.8 mmol/L (ref 3.5–5.1)
Potassium: 4.1 mmol/L (ref 3.5–5.1)
SODIUM: 136 mmol/L (ref 135–145)
SODIUM: 138 mmol/L (ref 135–145)
Sodium: 139 mmol/L (ref 135–145)
Sodium: 140 mmol/L (ref 135–145)
Sodium: 140 mmol/L (ref 135–145)
Sodium: 136 mmol/L (ref 135–145)
TCO2: 25 mmol/L (ref 0–100)
TCO2: 22 mmol/L (ref 0–100)
TCO2: 22 mmol/L (ref 0–100)
TCO2: 22 mmol/L (ref 0–100)
TCO2: 22 mmol/L (ref 0–100)
TCO2: 21 mmol/L (ref 0–100)

## 2015-01-27 LAB — CBC
HCT: 31.8 % — ABNORMAL LOW (ref 39.0–52.0)
Hemoglobin: 10.9 g/dL — ABNORMAL LOW (ref 13.0–17.0)
MCH: 31.9 pg (ref 26.0–34.0)
MCHC: 34.3 g/dL (ref 30.0–36.0)
MCV: 93 fL (ref 78.0–100.0)
PLATELETS: 133 10*3/uL — AB (ref 150–400)
RBC: 3.42 MIL/uL — ABNORMAL LOW (ref 4.22–5.81)
RDW: 13.4 % (ref 11.5–15.5)
WBC: 8.3 10*3/uL (ref 4.0–10.5)

## 2015-01-27 LAB — POCT I-STAT 4, (NA,K, GLUC, HGB,HCT)
Glucose, Bld: 118 mg/dL — ABNORMAL HIGH (ref 70–99)
HCT: 32 % — ABNORMAL LOW (ref 39.0–52.0)
HEMOGLOBIN: 10.9 g/dL — AB (ref 13.0–17.0)
Potassium: 3.6 mmol/L (ref 3.5–5.1)
Sodium: 141 mmol/L (ref 135–145)

## 2015-01-27 LAB — POCT I-STAT 3, ART BLOOD GAS (G3+)
ACID-BASE DEFICIT: 1 mmol/L (ref 0.0–2.0)
Acid-base deficit: 6 mmol/L — ABNORMAL HIGH (ref 0.0–2.0)
BICARBONATE: 24.3 meq/L — AB (ref 20.0–24.0)
BICARBONATE: 20.4 meq/L (ref 20.0–24.0)
O2 Saturation: 100 %
O2 Saturation: 91 %
PCO2 ART: 42.4 mmHg (ref 35.0–45.0)
PCO2 ART: 37.9 mmHg (ref 35.0–45.0)
PH ART: 7.33 — AB (ref 7.350–7.450)
PO2 ART: 356 mmHg — AB (ref 80.0–100.0)
PO2 ART: 58 mmHg — AB (ref 80.0–100.0)
Patient temperature: 35.1
TCO2: 26 mmol/L (ref 0–100)
TCO2: 22 mmol/L (ref 0–100)
pH, Arterial: 7.367 (ref 7.350–7.450)

## 2015-01-27 LAB — PROTIME-INR
INR: 1.43 (ref 0.00–1.49)
PROTHROMBIN TIME: 17.6 s — AB (ref 11.6–15.2)

## 2015-01-27 LAB — PREPARE RBC (CROSSMATCH)

## 2015-01-27 LAB — PLATELET COUNT: Platelets: 127 10*3/uL — ABNORMAL LOW (ref 150–400)

## 2015-01-27 LAB — POCT I-STAT GLUCOSE
Glucose, Bld: 101 mg/dL — ABNORMAL HIGH (ref 70–99)
Glucose, Bld: 94 mg/dL (ref 70–99)
OPERATOR ID: 219211
Operator id: 219211

## 2015-01-27 LAB — APTT: APTT: 38 s — AB (ref 24–37)

## 2015-01-27 LAB — HEMOGLOBIN AND HEMATOCRIT, BLOOD
HEMATOCRIT: 23.4 % — AB (ref 39.0–52.0)
HEMOGLOBIN: 8.1 g/dL — AB (ref 13.0–17.0)

## 2015-01-27 SURGERY — REDO CORONARY ARTERY BYPASS GRAFTING (CABG)
Anesthesia: General | Site: Chest

## 2015-01-27 MED ORDER — STERILE WATER FOR INJECTION IJ SOLN
INTRAMUSCULAR | Status: AC
  Filled 2015-01-27: qty 20

## 2015-01-27 MED ORDER — NITROGLYCERIN IN D5W 200-5 MCG/ML-% IV SOLN
INTRAVENOUS | Status: DC | PRN
  Administered 2015-01-27: 5 ug/min via INTRAVENOUS

## 2015-01-27 MED ORDER — ASPIRIN 81 MG PO CHEW: 324.0000 mg | CHEWABLE_TABLET | Freq: Every day | ORAL | Status: DC

## 2015-01-27 MED ORDER — PHENYLEPHRINE HCL 10 MG/ML IJ SOLN
INTRAMUSCULAR | Status: AC
  Filled 2015-01-27: qty 1

## 2015-01-27 MED ORDER — INSULIN REGULAR BOLUS VIA INFUSION
0.0000 [IU] | Freq: Three times a day (TID) | INTRAVENOUS | Status: DC
  Administered 2015-01-28: 3 [IU] via INTRAVENOUS
  Filled 2015-01-27: qty 10

## 2015-01-27 MED ORDER — METOPROLOL TARTRATE 12.5 MG HALF TABLET
12.5000 mg | ORAL_TABLET | Freq: Two times a day (BID) | ORAL | Status: DC
  Administered 2015-01-28 – 2015-01-29 (×3): 12.5 mg via ORAL
  Filled 2015-01-27 (×5): qty 1

## 2015-01-27 MED ORDER — 0.9 % SODIUM CHLORIDE (POUR BTL) OPTIME
TOPICAL | Status: DC | PRN
  Administered 2015-01-27: 6000 mL

## 2015-01-27 MED ORDER — THROMBIN 20000 UNITS EX SOLR
CUTANEOUS | Status: DC | PRN
  Administered 2015-01-27: 11:00:00 via TOPICAL
  Administered 2015-01-27: 12 mL via TOPICAL
  Administered 2015-01-27: 11:00:00 via TOPICAL

## 2015-01-27 MED ORDER — PROPOFOL 10 MG/ML IV BOLUS
INTRAVENOUS | Status: DC | PRN
  Administered 2015-01-27: 70 mg via INTRAVENOUS

## 2015-01-27 MED ORDER — EPHEDRINE SULFATE 50 MG/ML IJ SOLN
INTRAMUSCULAR | Status: DC | PRN
  Administered 2015-01-27 (×2): 5 mg via INTRAVENOUS
  Administered 2015-01-27: 10 mg via INTRAVENOUS
  Administered 2015-01-27: 5 mg via INTRAVENOUS
  Administered 2015-01-27: 10 mg via INTRAVENOUS
  Administered 2015-01-27 (×5): 5 mg via INTRAVENOUS

## 2015-01-27 MED ORDER — ALBUMIN HUMAN 5 % IV SOLN
INTRAVENOUS | Status: DC | PRN
  Administered 2015-01-27 (×2): via INTRAVENOUS

## 2015-01-27 MED ORDER — METOPROLOL TARTRATE 1 MG/ML IV SOLN
2.5000 mg | INTRAVENOUS | Status: DC | PRN
Start: 2015-01-27 — End: 2015-02-01
  Administered 2015-01-29: 5 mg via INTRAVENOUS
  Filled 2015-01-27 (×2): qty 5

## 2015-01-27 MED ORDER — VECURONIUM BROMIDE 10 MG IV SOLR
INTRAVENOUS | Status: AC
  Filled 2015-01-27: qty 10

## 2015-01-27 MED ORDER — PROPOFOL 10 MG/ML IV BOLUS
INTRAVENOUS | Status: AC
  Filled 2015-01-27: qty 20

## 2015-01-27 MED ORDER — OXYCODONE HCL 5 MG PO TABS
5.0000 mg | ORAL_TABLET | ORAL | Status: DC | PRN
  Administered 2015-01-28 – 2015-01-30 (×6): 10 mg via ORAL
  Filled 2015-01-27 (×6): qty 2

## 2015-01-27 MED ORDER — CHLORHEXIDINE GLUCONATE 0.12 % MT SOLN
15.0000 mL | Freq: Two times a day (BID) | OROMUCOSAL | Status: DC
  Administered 2015-01-27 – 2015-01-28 (×2): 15 mL via OROMUCOSAL
  Filled 2015-01-27: qty 15

## 2015-01-27 MED ORDER — MIDAZOLAM HCL 5 MG/5ML IJ SOLN
INTRAMUSCULAR | Status: DC | PRN
Start: 2015-01-27 — End: 2015-01-27
  Administered 2015-01-27: 6 mg via INTRAVENOUS
  Administered 2015-01-27: 4 mg via INTRAVENOUS
  Administered 2015-01-27: 2 mg via INTRAVENOUS

## 2015-01-27 MED ORDER — LACTATED RINGERS IV SOLN
INTRAVENOUS | Status: DC | PRN
  Administered 2015-01-27 (×4): via INTRAVENOUS

## 2015-01-27 MED ORDER — DEXMEDETOMIDINE HCL IN NACL 200 MCG/50ML IV SOLN
0.0000 ug/kg/h | INTRAVENOUS | Status: DC
  Administered 2015-01-27: 0.7 ug/kg/h via INTRAVENOUS

## 2015-01-27 MED ORDER — BISACODYL 10 MG RE SUPP: 10.0000 mg | Freq: Every day | RECTAL | Status: DC

## 2015-01-27 MED ORDER — ACETAMINOPHEN 650 MG RE SUPP
650.0000 mg | Freq: Once | RECTAL | Status: AC
  Administered 2015-01-27: 650 mg via RECTAL

## 2015-01-27 MED ORDER — SODIUM CHLORIDE 0.45 % IV SOLN: INTRAVENOUS | Status: DC | PRN

## 2015-01-27 MED ORDER — LACTATED RINGERS IV SOLN
INTRAVENOUS | Status: DC
  Administered 2015-01-27 – 2015-01-29 (×2): via INTRAVENOUS

## 2015-01-27 MED ORDER — DOPAMINE-DEXTROSE 3.2-5 MG/ML-% IV SOLN
INTRAVENOUS | Status: DC | PRN
  Administered 2015-01-27: 3 ug/kg/min via INTRAVENOUS

## 2015-01-27 MED ORDER — DOCUSATE SODIUM 100 MG PO CAPS
200.0000 mg | ORAL_CAPSULE | Freq: Every day | ORAL | Status: DC
  Administered 2015-01-28 – 2015-02-01 (×5): 200 mg via ORAL
  Filled 2015-01-27 (×5): qty 2

## 2015-01-27 MED ORDER — SUCCINYLCHOLINE CHLORIDE 20 MG/ML IJ SOLN
INTRAMUSCULAR | Status: DC | PRN
  Administered 2015-01-27: 120 mg via INTRAVENOUS

## 2015-01-27 MED ORDER — PHENYLEPHRINE HCL 10 MG/ML IJ SOLN
0.0000 ug/min | INTRAVENOUS | Status: DC
  Administered 2015-01-27 (×2): 65 ug/min via INTRAVENOUS
  Administered 2015-01-28: 60 ug/min via INTRAVENOUS
  Filled 2015-01-27 (×4): qty 2

## 2015-01-27 MED ORDER — SODIUM CHLORIDE 0.9 % IJ SOLN: 3.0000 mL | Freq: Two times a day (BID) | INTRAMUSCULAR | Status: DC

## 2015-01-27 MED ORDER — TRAMADOL HCL 50 MG PO TABS
50.0000 mg | ORAL_TABLET | ORAL | Status: DC | PRN
  Administered 2015-01-29: 50 mg via ORAL
  Administered 2015-01-29 – 2015-01-31 (×4): 100 mg via ORAL
  Filled 2015-01-27 (×2): qty 2
  Filled 2015-01-27: qty 1
  Filled 2015-01-27 (×3): qty 2

## 2015-01-27 MED ORDER — LACTATED RINGERS IV SOLN: 500.0000 mL | Freq: Once | INTRAVENOUS | Status: AC | PRN

## 2015-01-27 MED ORDER — HEMOSTATIC AGENTS (NO CHARGE) OPTIME
TOPICAL | Status: DC | PRN
  Administered 2015-01-27: 1 via TOPICAL

## 2015-01-27 MED ORDER — PHENYLEPHRINE HCL 10 MG/ML IJ SOLN
10.0000 mg | INTRAVENOUS | Status: DC | PRN
  Administered 2015-01-27: 20 ug/min via INTRAVENOUS

## 2015-01-27 MED ORDER — LACTATED RINGERS IV SOLN
INTRAVENOUS | Status: DC
  Administered 2015-01-27: 19:00:00 via INTRAVENOUS

## 2015-01-27 MED ORDER — FENTANYL CITRATE 0.05 MG/ML IJ SOLN
INTRAMUSCULAR | Status: DC | PRN
  Administered 2015-01-27: 50 ug via INTRAVENOUS
  Administered 2015-01-27: 150 ug via INTRAVENOUS
  Administered 2015-01-27 (×3): 250 ug via INTRAVENOUS
  Administered 2015-01-27: 25 ug via INTRAVENOUS
  Administered 2015-01-27: 250 ug via INTRAVENOUS
  Administered 2015-01-27: 25 ug via INTRAVENOUS
  Administered 2015-01-27: 100 ug via INTRAVENOUS

## 2015-01-27 MED ORDER — ROCURONIUM BROMIDE 50 MG/5ML IV SOLN
INTRAVENOUS | Status: AC
  Filled 2015-01-27: qty 2

## 2015-01-27 MED ORDER — HEPARIN SODIUM (PORCINE) 1000 UNIT/ML IJ SOLN
INTRAMUSCULAR | Status: AC
  Filled 2015-01-27: qty 1

## 2015-01-27 MED ORDER — THROMBIN 20000 UNITS EX SOLR
CUTANEOUS | Status: AC
  Filled 2015-01-27: qty 20000

## 2015-01-27 MED ORDER — DOPAMINE-DEXTROSE 3.2-5 MG/ML-% IV SOLN
3.0000 ug/kg/min | INTRAVENOUS | Status: DC
  Administered 2015-01-27: 3 ug/kg/min via INTRAVENOUS

## 2015-01-27 MED ORDER — VECURONIUM BROMIDE 10 MG IV SOLR
INTRAVENOUS | Status: DC | PRN
  Administered 2015-01-27 (×6): 5 mg via INTRAVENOUS

## 2015-01-27 MED ORDER — SODIUM CHLORIDE 0.9 % IV SOLN
0.5000 g/h | Freq: Once | INTRAVENOUS | Status: DC
  Filled 2015-01-27: qty 20

## 2015-01-27 MED ORDER — SODIUM CHLORIDE 0.9 % IJ SOLN
INTRAMUSCULAR | Status: AC
  Filled 2015-01-27: qty 20

## 2015-01-27 MED ORDER — MIDAZOLAM HCL 2 MG/2ML IJ SOLN
INTRAMUSCULAR | Status: AC
  Filled 2015-01-27: qty 2

## 2015-01-27 MED ORDER — SODIUM CHLORIDE 0.9 % IJ SOLN: 3.0000 mL | INTRAMUSCULAR | Status: DC | PRN

## 2015-01-27 MED ORDER — GLYCOPYRROLATE 0.2 MG/ML IJ SOLN
INTRAMUSCULAR | Status: AC
  Filled 2015-01-27: qty 2

## 2015-01-27 MED ORDER — BISACODYL 5 MG PO TBEC
10.0000 mg | DELAYED_RELEASE_TABLET | Freq: Every day | ORAL | Status: DC
  Administered 2015-01-28 – 2015-01-31 (×4): 10 mg via ORAL
  Filled 2015-01-27 (×4): qty 2

## 2015-01-27 MED ORDER — ALBUMIN HUMAN 5 % IV SOLN
250.0000 mL | INTRAVENOUS | Status: AC | PRN
  Administered 2015-01-27 – 2015-01-28 (×2): 250 mL via INTRAVENOUS
  Filled 2015-01-27: qty 250

## 2015-01-27 MED ORDER — MORPHINE SULFATE 2 MG/ML IJ SOLN: 1.0000 mg | INTRAMUSCULAR | Status: AC | PRN

## 2015-01-27 MED ORDER — FENTANYL CITRATE 0.05 MG/ML IJ SOLN
INTRAMUSCULAR | Status: AC
  Filled 2015-01-27: qty 5

## 2015-01-27 MED ORDER — MIDAZOLAM HCL 10 MG/2ML IJ SOLN
INTRAMUSCULAR | Status: AC
  Filled 2015-01-27: qty 2

## 2015-01-27 MED ORDER — ASPIRIN EC 325 MG PO TBEC
325.0000 mg | DELAYED_RELEASE_TABLET | Freq: Every day | ORAL | Status: DC
  Administered 2015-01-28 – 2015-02-01 (×5): 325 mg via ORAL
  Filled 2015-01-27 (×5): qty 1

## 2015-01-27 MED ORDER — SODIUM CHLORIDE 0.9 % IV SOLN
200.0000 ug | INTRAVENOUS | Status: DC | PRN
  Administered 2015-01-27: 0.2 ug/kg/h via INTRAVENOUS

## 2015-01-27 MED ORDER — HEPARIN SODIUM (PORCINE) 1000 UNIT/ML IJ SOLN
INTRAMUSCULAR | Status: DC | PRN
  Administered 2015-01-27: 35 mL via INTRAVENOUS

## 2015-01-27 MED ORDER — VANCOMYCIN HCL IN DEXTROSE 1-5 GM/200ML-% IV SOLN
1000.0000 mg | Freq: Once | INTRAVENOUS | Status: AC
  Administered 2015-01-28: 1000 mg via INTRAVENOUS
  Filled 2015-01-27: qty 200

## 2015-01-27 MED ORDER — ACETAMINOPHEN 160 MG/5ML PO SOLN: 650.0000 mg | Freq: Once | ORAL | Status: AC

## 2015-01-27 MED ORDER — MAGNESIUM SULFATE 4 GM/100ML IV SOLN
4.0000 g | Freq: Once | INTRAVENOUS | Status: AC
  Administered 2015-01-27: 4 g via INTRAVENOUS
  Filled 2015-01-27: qty 100

## 2015-01-27 MED ORDER — LIDOCAINE HCL (CARDIAC) 20 MG/ML IV SOLN
INTRAVENOUS | Status: DC | PRN
  Administered 2015-01-27: 80 mg via INTRAVENOUS

## 2015-01-27 MED ORDER — SODIUM CHLORIDE 0.9 % IV SOLN: Freq: Once | INTRAVENOUS | Status: DC

## 2015-01-27 MED ORDER — METOPROLOL TARTRATE 25 MG/10 ML ORAL SUSPENSION
12.5000 mg | Freq: Two times a day (BID) | ORAL | Status: DC
  Filled 2015-01-27 (×5): qty 5

## 2015-01-27 MED ORDER — MIDAZOLAM HCL 2 MG/2ML IJ SOLN: 2.0000 mg | INTRAMUSCULAR | Status: DC | PRN

## 2015-01-27 MED ORDER — POTASSIUM CHLORIDE 10 MEQ/50ML IV SOLN
10.0000 meq | INTRAVENOUS | Status: AC
  Administered 2015-01-27 (×3): 10 meq via INTRAVENOUS

## 2015-01-27 MED ORDER — THROMBIN 20000 UNITS EX KIT
PACK | CUTANEOUS | Status: DC | PRN
  Administered 2015-01-27: 20000 [IU] via TOPICAL

## 2015-01-27 MED ORDER — SODIUM CHLORIDE 0.9 % IV SOLN
INTRAVENOUS | Status: DC
  Administered 2015-01-27: 19:00:00 via INTRAVENOUS

## 2015-01-27 MED ORDER — VECURONIUM BROMIDE 10 MG IV SOLR
INTRAVENOUS | Status: AC
  Filled 2015-01-27: qty 20

## 2015-01-27 MED ORDER — DEXMEDETOMIDINE HCL IN NACL 200 MCG/50ML IV SOLN
INTRAVENOUS | Status: AC
  Filled 2015-01-27: qty 50

## 2015-01-27 MED ORDER — AMINOCAPROIC ACID 250 MG/ML IV SOLN
10.0000 g | INTRAVENOUS | Status: DC | PRN
  Administered 2015-01-27: 13:00:00 via INTRAVENOUS
  Administered 2015-01-27: 5 g/h via INTRAVENOUS

## 2015-01-27 MED ORDER — ATORVASTATIN CALCIUM 80 MG PO TABS: 80.0000 mg | ORAL_TABLET | Freq: Every day | ORAL | Status: DC

## 2015-01-27 MED ORDER — ATORVASTATIN CALCIUM 80 MG PO TABS
80.0000 mg | ORAL_TABLET | Freq: Every day | ORAL | Status: DC
  Administered 2015-01-28 – 2015-01-31 (×4): 80 mg via ORAL
  Filled 2015-01-27 (×6): qty 1

## 2015-01-27 MED ORDER — LACTATED RINGERS IV SOLN
INTRAVENOUS | Status: DC | PRN
  Administered 2015-01-27 (×2): via INTRAVENOUS

## 2015-01-27 MED ORDER — SODIUM CHLORIDE 0.9 % IV SOLN
INTRAVENOUS | Status: DC
  Administered 2015-01-27: 23:00:00 via INTRAVENOUS
  Filled 2015-01-27 (×2): qty 2.5

## 2015-01-27 MED ORDER — FENTANYL CITRATE 0.05 MG/ML IJ SOLN
INTRAMUSCULAR | Status: AC
  Filled 2015-01-27: qty 2

## 2015-01-27 MED ORDER — MORPHINE SULFATE 2 MG/ML IJ SOLN
2.0000 mg | INTRAMUSCULAR | Status: DC | PRN
  Administered 2015-01-28 (×3): 2 mg via INTRAVENOUS
  Administered 2015-01-28: 4 mg via INTRAVENOUS
  Administered 2015-01-28: 2 mg via INTRAVENOUS
  Administered 2015-01-29: 4 mg via INTRAVENOUS
  Filled 2015-01-27: qty 1
  Filled 2015-01-27: qty 2
  Filled 2015-01-27: qty 1
  Filled 2015-01-27 (×2): qty 2

## 2015-01-27 MED ORDER — DEXTROSE 5 % IV SOLN
1.5000 g | Freq: Two times a day (BID) | INTRAVENOUS | Status: AC
  Administered 2015-01-27 – 2015-01-29 (×4): 1.5 g via INTRAVENOUS
  Filled 2015-01-27 (×4): qty 1.5

## 2015-01-27 MED ORDER — PROTAMINE SULFATE 10 MG/ML IV SOLN
INTRAVENOUS | Status: DC | PRN
  Administered 2015-01-27: 50 mg via INTRAVENOUS
  Administered 2015-01-27: 20 mg via INTRAVENOUS
  Administered 2015-01-27: 50 mg via INTRAVENOUS
  Administered 2015-01-27: 70 mg via INTRAVENOUS
  Administered 2015-01-27 (×2): 50 mg via INTRAVENOUS

## 2015-01-27 MED ORDER — GLYCOPYRROLATE 0.2 MG/ML IJ SOLN
INTRAMUSCULAR | Status: DC | PRN
  Administered 2015-01-27 (×2): 0.1 mg via INTRAVENOUS

## 2015-01-27 MED ORDER — NITROGLYCERIN IN D5W 200-5 MCG/ML-% IV SOLN
0.0000 ug/min | INTRAVENOUS | Status: DC
  Administered 2015-01-27: 5 ug/min via INTRAVENOUS

## 2015-01-27 MED ORDER — FAMOTIDINE IN NACL 20-0.9 MG/50ML-% IV SOLN
20.0000 mg | Freq: Two times a day (BID) | INTRAVENOUS | Status: DC
  Administered 2015-01-27: 20 mg via INTRAVENOUS
  Filled 2015-01-27: qty 50

## 2015-01-27 MED ORDER — SODIUM CHLORIDE 0.9 % IV SOLN: 250.0000 mL | INTRAVENOUS | Status: DC

## 2015-01-27 MED ORDER — CETYLPYRIDINIUM CHLORIDE 0.05 % MT LIQD
7.0000 mL | Freq: Four times a day (QID) | OROMUCOSAL | Status: DC
  Administered 2015-01-28 (×3): 7 mL via OROMUCOSAL

## 2015-01-27 MED ORDER — ONDANSETRON HCL 4 MG/2ML IJ SOLN
4.0000 mg | Freq: Four times a day (QID) | INTRAMUSCULAR | Status: DC | PRN
  Administered 2015-01-28: 4 mg via INTRAVENOUS
  Filled 2015-01-27: qty 2

## 2015-01-27 MED ORDER — ACETAMINOPHEN 500 MG PO TABS
1000.0000 mg | ORAL_TABLET | Freq: Four times a day (QID) | ORAL | Status: DC
  Administered 2015-01-28 – 2015-02-01 (×16): 1000 mg via ORAL
  Filled 2015-01-27 (×20): qty 2

## 2015-01-27 MED ORDER — SODIUM CHLORIDE 0.9 % IV SOLN
250.0000 [IU] | INTRAVENOUS | Status: DC | PRN
  Administered 2015-01-27: 1 [IU]/h via INTRAVENOUS

## 2015-01-27 MED ORDER — ACETAMINOPHEN 160 MG/5ML PO SOLN
1000.0000 mg | Freq: Four times a day (QID) | ORAL | Status: DC
Start: 2015-01-28 — End: 2015-02-01
  Filled 2015-01-27: qty 40

## 2015-01-27 MED ORDER — PANTOPRAZOLE SODIUM 40 MG PO TBEC
40.0000 mg | DELAYED_RELEASE_TABLET | Freq: Every day | ORAL | Status: DC
  Administered 2015-01-29 – 2015-02-01 (×4): 40 mg via ORAL
  Filled 2015-01-27 (×4): qty 1

## 2015-01-27 SURGICAL SUPPLY — 111 items
ADAPTER CARDIO PERF ANTE/RETRO (ADAPTER) ×5 IMPLANT
BAG DECANTER FOR FLEXI CONT (MISCELLANEOUS) ×5 IMPLANT
BANDAGE ELASTIC 4 VELCRO ST LF (GAUZE/BANDAGES/DRESSINGS) ×10 IMPLANT
BANDAGE ELASTIC 6 VELCRO ST LF (GAUZE/BANDAGES/DRESSINGS) ×5 IMPLANT
BLADE SURG 10 STRL SS (BLADE) ×5 IMPLANT
BLADE SURG 15 STRL LF DISP TIS (BLADE) ×6 IMPLANT
BLADE SURG 15 STRL SS (BLADE) ×4
BLADE SURG ROTATE 9660 (MISCELLANEOUS) IMPLANT
BNDG GAUZE ELAST 4 BULKY (GAUZE/BANDAGES/DRESSINGS) ×10 IMPLANT
CANISTER SUCTION 2500CC (MISCELLANEOUS) ×5 IMPLANT
CANNULA GUNDRY RCSP 15FR (MISCELLANEOUS) ×5 IMPLANT
CATH ROBINSON RED A/P 18FR (CATHETERS) ×10 IMPLANT
CATH THORACIC 28FR (CATHETERS) ×5 IMPLANT
CATH THORACIC 36FR (CATHETERS) ×5 IMPLANT
CATH THORACIC 36FR RT ANG (CATHETERS) ×5 IMPLANT
CLIP FOGARTY SPRING 6M (CLIP) IMPLANT
CLIP RETRACTION 3.0MM CORONARY (MISCELLANEOUS) ×5 IMPLANT
CLIP TI MEDIUM 24 (CLIP) ×5 IMPLANT
CLIP TI WIDE RED SMALL 24 (CLIP) ×15 IMPLANT
CONN 3/8X3/8 GISH STERILE (MISCELLANEOUS) ×5 IMPLANT
CONN ST 1/4X3/8  BEN (MISCELLANEOUS) ×2
CONN ST 1/4X3/8 BEN (MISCELLANEOUS) ×3 IMPLANT
COVER MAYO STAND STRL (DRAPES) ×5 IMPLANT
COVER SURGICAL LIGHT HANDLE (MISCELLANEOUS) ×10 IMPLANT
CRADLE DONUT ADULT HEAD (MISCELLANEOUS) ×5 IMPLANT
DRAPE CARDIOVASCULAR INCISE (DRAPES) ×2
DRAPE EXTREMITY T 121X128X90 (DRAPE) ×5 IMPLANT
DRAPE PROXIMA HALF (DRAPES) ×5 IMPLANT
DRAPE SLUSH/WARMER DISC (DRAPES) IMPLANT
DRAPE SRG 135X102X78XABS (DRAPES) ×3 IMPLANT
DRSG COVADERM 4X14 (GAUZE/BANDAGES/DRESSINGS) ×5 IMPLANT
ELECT CAUTERY BLADE 6.4 (BLADE) ×5 IMPLANT
ELECT REM PT RETURN 9FT ADLT (ELECTROSURGICAL) ×10
ELECTRODE REM PT RTRN 9FT ADLT (ELECTROSURGICAL) ×6 IMPLANT
GAUZE SPONGE 4X4 12PLY STRL (GAUZE/BANDAGES/DRESSINGS) ×10 IMPLANT
GEL ULTRASOUND 20GR AQUASONIC (MISCELLANEOUS) ×5 IMPLANT
GLOVE BIO SURGEON STRL SZ 6 (GLOVE) ×10 IMPLANT
GLOVE BIO SURGEON STRL SZ 6.5 (GLOVE) ×12 IMPLANT
GLOVE BIO SURGEON STRL SZ7 (GLOVE) IMPLANT
GLOVE BIO SURGEON STRL SZ7.5 (GLOVE) IMPLANT
GLOVE BIO SURGEONS STRL SZ 6.5 (GLOVE) ×3
GLOVE BIOGEL PI IND STRL 6 (GLOVE) ×6 IMPLANT
GLOVE BIOGEL PI IND STRL 6.5 (GLOVE) ×15 IMPLANT
GLOVE BIOGEL PI IND STRL 7.0 (GLOVE) ×9 IMPLANT
GLOVE BIOGEL PI INDICATOR 6 (GLOVE) ×4
GLOVE BIOGEL PI INDICATOR 6.5 (GLOVE) ×10
GLOVE BIOGEL PI INDICATOR 7.0 (GLOVE) ×6
GLOVE EUDERMIC 7 POWDERFREE (GLOVE) ×15 IMPLANT
GLOVE ORTHO TXT STRL SZ7.5 (GLOVE) IMPLANT
GOWN STRL REUS W/ TWL LRG LVL3 (GOWN DISPOSABLE) ×12 IMPLANT
GOWN STRL REUS W/ TWL XL LVL3 (GOWN DISPOSABLE) ×3 IMPLANT
GOWN STRL REUS W/TWL LRG LVL3 (GOWN DISPOSABLE) ×8
GOWN STRL REUS W/TWL XL LVL3 (GOWN DISPOSABLE) ×2
HARMONIC SHEARS 14CM COAG (MISCELLANEOUS) ×15 IMPLANT
HEMOSTAT POWDER SURGIFOAM 1G (HEMOSTASIS) ×15 IMPLANT
HEMOSTAT SURGICEL 2X14 (HEMOSTASIS) ×5 IMPLANT
INSERT FOGARTY 61MM (MISCELLANEOUS) IMPLANT
INSERT FOGARTY XLG (MISCELLANEOUS) IMPLANT
IV CATH 22GX1 FEP (IV SOLUTION) ×5 IMPLANT
KIT BASIN OR (CUSTOM PROCEDURE TRAY) ×5 IMPLANT
KIT CATH CPB BARTLE (MISCELLANEOUS) ×5 IMPLANT
KIT ROOM TURNOVER OR (KITS) ×5 IMPLANT
KIT SUCTION CATH 14FR (SUCTIONS) ×5 IMPLANT
KIT VASOVIEW W/TROCAR VH 2000 (KITS) ×5 IMPLANT
NS IRRIG 1000ML POUR BTL (IV SOLUTION) ×25 IMPLANT
PACK OPEN HEART (CUSTOM PROCEDURE TRAY) ×5 IMPLANT
PAD ARMBOARD 7.5X6 YLW CONV (MISCELLANEOUS) ×10 IMPLANT
PAD DEFIB R2 (MISCELLANEOUS) ×5 IMPLANT
PAD ELECT DEFIB RADIOL ZOLL (MISCELLANEOUS) ×5 IMPLANT
PENCIL BUTTON HOLSTER BLD 10FT (ELECTRODE) ×5 IMPLANT
PUNCH AORTIC ROTATE 4.0MM (MISCELLANEOUS) IMPLANT
PUNCH AORTIC ROTATE 4.5MM 8IN (MISCELLANEOUS) ×10 IMPLANT
PUNCH AORTIC ROTATE 5MM 8IN (MISCELLANEOUS) IMPLANT
SEALANT SURG COSEAL 4ML (VASCULAR PRODUCTS) ×5 IMPLANT
SET CARDIOPLEGIA MPS 5001102 (MISCELLANEOUS) ×5 IMPLANT
SPONGE GAUZE 4X4 12PLY STER LF (GAUZE/BANDAGES/DRESSINGS) ×15 IMPLANT
SPONGE INTESTINAL PEANUT (DISPOSABLE) IMPLANT
SPONGE LAP 18X18 X RAY DECT (DISPOSABLE) ×25 IMPLANT
SPONGE LAP 4X18 X RAY DECT (DISPOSABLE) ×10 IMPLANT
SUT BONE WAX W31G (SUTURE) ×5 IMPLANT
SUT PROLENE 3 0 SH DA (SUTURE) IMPLANT
SUT PROLENE 3 0 SH1 36 (SUTURE) ×5 IMPLANT
SUT PROLENE 4 0 RB 1 (SUTURE) ×2
SUT PROLENE 4-0 RB1 .5 CRCL 36 (SUTURE) ×3 IMPLANT
SUT PROLENE 6 0 C 1 30 (SUTURE) ×10 IMPLANT
SUT PROLENE 7 0 BV1 MDA (SUTURE) ×5 IMPLANT
SUT PROLENE 7.0 RB 3 (SUTURE) ×20 IMPLANT
SUT PROLENE 8 0 BV175 6 (SUTURE) ×20 IMPLANT
SUT PROLENE BLUE 7 0 (SUTURE) IMPLANT
SUT SILK  1 MH (SUTURE) ×2
SUT SILK 1 MH (SUTURE) ×3 IMPLANT
SUT VIC AB 1 CTX 36 (SUTURE) ×4
SUT VIC AB 1 CTX36XBRD ANBCTR (SUTURE) ×6 IMPLANT
SUT VIC AB 2-0 CT1 27 (SUTURE) ×4
SUT VIC AB 2-0 CT1 TAPERPNT 27 (SUTURE) ×6 IMPLANT
SUT VIC AB 3-0 SH 27 (SUTURE) ×2
SUT VIC AB 3-0 SH 27X BRD (SUTURE) ×3 IMPLANT
SUT VIC AB 3-0 X1 27 (SUTURE) ×10 IMPLANT
SUTURE E-PAK OPEN HEART (SUTURE) ×5 IMPLANT
SYR 50ML SLIP (SYRINGE) ×5 IMPLANT
SYSTEM SAHARA CHEST DRAIN ATS (WOUND CARE) ×5 IMPLANT
TAPE CLOTH SURG 4X10 WHT LF (GAUZE/BANDAGES/DRESSINGS) ×5 IMPLANT
TAPE PAPER 2X10 WHT MICROPORE (GAUZE/BANDAGES/DRESSINGS) ×5 IMPLANT
TOWEL OR 17X24 6PK STRL BLUE (TOWEL DISPOSABLE) ×10 IMPLANT
TOWEL OR 17X26 10 PK STRL BLUE (TOWEL DISPOSABLE) ×5 IMPLANT
TRAY CATH LUMEN 1 20CM STRL (SET/KITS/TRAYS/PACK) ×5 IMPLANT
TRAY FOLEY IC TEMP SENS 16FR (CATHETERS) ×5 IMPLANT
TUBING ART PRESS 48 MALE/FEM (TUBING) ×10 IMPLANT
TUBING INSUFFLATION (TUBING) ×5 IMPLANT
UNDERPAD 30X30 INCONTINENT (UNDERPADS AND DIAPERS) ×5 IMPLANT
WATER STERILE IRR 1000ML POUR (IV SOLUTION) ×10 IMPLANT

## 2015-01-27 NOTE — H&P (Addendum)
[address]       [place] [address]             [phone]      Cardiothoracic Surgery History and Physical  PCP is Gar Ponto, MD Referring Provider is Herminio Commons, MD  Chief Complaint  Patient presents with  . Chest Pain        HPI:  The patient is a 64 year old gentleman who underwent CABG x 4 by me in 06/2009. He had a LIMA to the LAD, SVG to the diagonal, SVG to OM1, and a SVG to OM3. He presented in 12/2013 with unstable angina and was admitted with NSTEMI. Cardiac cath showed the SVG to OM3 to be occluded at its ostium. The SVG to the OM1 had mild plaque throughout with a 99% distal stenosis. The SVG to Diag had diffuse 40% mid stenosis and the LIMA to the LAD was widely patent. This was treated with a DES to the distal OM1 and PTCA of the mid AV groove LCX. He did well until November 2015 when he presented with a several day history of exertional angina progressing to nocturnal angina and ruled in for NSTEMI. The SVG to OM1 now had new 95% proximal stenosis and hazy disease in the midsegment with possible thrombus. The distal stent was patent. The SVG to the diagonal was occluded which was new. The patent LIMA to the LAD was supplying collaterals to the diagonal and the LCX distribution. Optimized medical therapy was recommended with long-acting NTG and Ranexa. He says that this has improved his symptoms but he is still very limited in what he can do before he gets chest pain. Cold weather and heavy meals bring on pain. He describes the pain as mild and relieved with rest. He denies rest angina.   Past Medical History  Diagnosis Date  . CAD (coronary artery disease)     a. 06/2009 Cath: 3VD, nondominant RCA; b. 06/2009 CABG x 4: LIMA->LAD, VG->OM1, VG->OM3, VG->D1; c. 03/2011 ETT: normal; d. 12/2013 NSTEMI/Cath/PCI: LM nl, LAD 100p, LCX 100p, RCA nl, LIMA->LAD ok, VG->Diag 60m, VG->OM1 99d(3.5x18 Xience DES), VG->OM3 100d, EF  55%.  [name] HTN (hypertension)   . Hypercholesterolemia   . PUD (peptic ulcer disease)   . 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 GERD (gastroesophageal reflux disease)   . Osteoarthritis   . Horseshoe kidney     Past Surgical History  Procedure Laterality Date  . Other surgical history      BIL.CARPAL TUNNEL SURG.  . Rt.knee replacement    . Cardiac catheterization  CABG  . Kidney stone surgery    . Carpal tunnel release      bil  . Knee arthroscopy      rt x2, lft x3  . Coronary artery bypass graft  2010  . Hernia repair  2011    rt ing  . Total knee arthroplasty  02/11/2012    Procedure: TOTAL KNEE ARTHROPLASTY; Surgeon: Rudean Haskell, MD; Location: [place]; Service: Orthopedics; Laterality: Left; left total knee replacement  . Left heart catheterization with coronary/graft angiogram N/A 12/28/2013    Procedure: LEFT HEART CATHETERIZATION WITH Beatrix Fetters; Surgeon: Larey Dresser, MD; Location: [facility] CATH LAB; Service: Cardiovascular; Laterality: N/A;  . Left heart catheterization with coronary/graft angiogram N/A 09/19/2014    Procedure: LEFT HEART CATHETERIZATION WITH Beatrix Fetters; Surgeon: Wellington Hampshire, MD; Location: [facility] CATH LAB; Service: Cardiovascular; Laterality: N/A;  Family History  Problem Relation Age of Onset  . COPD Father     AGE 65    Social History History  Substance Use Topics  . Smoking status: Never Smoker   . Smokeless tobacco: Never Used     Comment: occ beer  . Alcohol Use: 0.0 oz/week    0 Not specified per week     Comment: 07/22/14 Beer every now and thenDelos Haring    Current Outpatient Prescriptions  Medication Sig Dispense Refill  . acetaminophen (TYLENOL) 650 MG CR tablet Take 1,300 mg by mouth every morning.     [name] aspirin EC 81 MG EC tablet Take 1 tablet (81 mg total) by  mouth daily.    [name] atorvastatin (LIPITOR) 80 MG tablet Take 1 tablet (80 mg total) by mouth daily at 6 PM. 30 tablet 6  . clopidogrel (PLAVIX) 75 MG tablet Take 1 tablet (75 mg total) by mouth daily with breakfast. 30 tablet 3  . diphenhydramine-acetaminophen (TYLENOL PM EXTRA STRENGTH) 25-500 MG TABS Take 1 tablet by mouth at bedtime as needed. For sleep    . fenofibrate 54 MG tablet Take 1 tablet (54 mg total) by mouth daily. 30 tablet 5  . gabapentin (NEURONTIN) 300 MG capsule Take 300-600 mg by mouth 2 (two) times daily. Take 300 mg in the morning and 600 mg at bedtime     . glucosamine-chondroitin 500-400 MG tablet Take 1 tablet by mouth 2 (two) times daily.     . isosorbide mononitrate (IMDUR) 30 MG 24 hr tablet Take 1 tablet (30 mg total) by mouth 2 (two) times daily. 60 tablet 6  . metoprolol tartrate (LOPRESSOR) 25 MG tablet Take 25 mg by mouth 2 (two) times daily.     . Multiple Vitamin (MULTIVITAMIN) tablet Take 1 tablet by mouth daily.     . nitroGLYCERIN (NITROSTAT) 0.4 MG SL tablet Place 1 tablet (0.4 mg total) under the tongue every 5 (five) minutes as needed for chest pain. 25 tablet 4  . potassium citrate (UROCIT-K) 5 MEQ (540 MG) SR tablet Take 5-10 mEq by mouth 2 (two) times daily. Take 2 tablets in the morning and 1 at night    . ranitidine (ZANTAC) 150 MG tablet take 1 tablet by mouth twice a day 60 tablet 4  . ranolazine (RANEXA) 1000 MG SR tablet Take 1 tablet (1,000 mg total) by mouth 2 (two) times daily. 60 tablet 5  . traMADol (ULTRAM) 50 MG tablet Take 50 mg by mouth 2 (two) times daily.     No current facility-administered medications for this visit.    No Known Allergies  Review of Systems  Constitutional: Positive for activity change. Negative for diaphoresis, appetite change and fatigue.  HENT: Negative.  Eyes: Negative.  Respiratory: Positive for chest tightness. Negative for  shortness of breath.  Cardiovascular: Positive for chest pain. Negative for palpitations and leg swelling.  Gastrointestinal: Negative.  Endocrine: Negative.  Genitourinary: Positive for hematuria.  Musculoskeletal: Negative.  Skin: Negative.  Allergic/Immunologic: Negative.  Neurological: Negative.  Hematological: Negative.  Psychiatric/Behavioral: Negative.    BP 100/65 mmHg  Pulse 67  Resp 16  Ht 5\' 9"  (1.753 m)  Wt 214 lb (97.07 kg)  BMI 31.59 kg/m2  SpO2 98% Physical Exam  Constitutional: He is oriented to person, place, and time. He appears well-developed and well-nourished.  HENT:  Head: Normocephalic and atraumatic.  Mouth/Throat: Oropharynx is clear and moist.  Eyes: EOM are normal. Pupils are equal, round, and reactive to  light.  Neck: Normal range of motion. Neck supple. No JVD present. No thyromegaly present.  Cardiovascular: Normal rate, regular rhythm, normal heart sounds and intact distal pulses.  No murmur heard. Pulmonary/Chest: Effort normal and breath sounds normal. No respiratory distress.  Old sternotomy scar  Abdominal: Soft. Bowel sounds are normal. He exhibits no distension and no mass. There is no tenderness.  Musculoskeletal: Normal range of motion. He exhibits no edema or tenderness.  Lymphadenopathy:   He has no cervical adenopathy.  Neurological: He is alert and oriented to person, place, and time. He has normal strength. No cranial nerve deficit or sensory deficit.  Skin: Skin is warm and dry.  Bilateral wrist scars from carpal tunnel surgery  Bilateral leg incisions medial to knees from saphenous vein harvest  Psychiatric: He has a normal mood and affect.     Diagnostic Tests:  Cardiac Catheterization Procedure Note  Name: Ricardo Jimenez MRN: [id number] DOB: 04/11/1951  Procedure: Left Heart Cath, Selective Coronary Angiography, SVG angiography, LIMA angiography, LV angiography  Indication: Non-ST elevation myocardial  infarction with known history of coronary artery disease status post CABG and PCI on SVG to OM1 early this year.  Medications: Sedation: 2 mg IV Versed, 100 mcg IV Fentanyl Contrast: 85 ML Omnipaque  Procedural details: The right groin was prepped, draped, and anesthetized with 1% lidocaine. Using modified Seldinger technique, a 6 French sheath was introduced into the right femoral artery. Standard Judkins catheters were used for coronary angiography and left ventriculography. A LIMA catheter was used over a long exchange wire. Catheter exchanges were performed over a guidewire. There were no immediate procedural complications. The patient was transferred to the post catheterization recovery area for further monitoring.  Procedural Findings:  Hemodynamics: AO: 107/61 mmHg LV: 112/6 mmHg LVEDP: 12 mmHg  Coronary angiography: Coronary dominance: Left   Left Main: 70% distal stenosis.  Left Anterior Descending (LAD): Occluded proximally  Circumflex (LCx): Occluded proximally  Right Coronary Artery: Nondominant with 70-80% proximal disease which is unchanged from before.  SVG to OM 3: Occluded which is not a new finding.  SVG to OM1: Patent with 95% proximal stenosis and hazy disease in the midsegment with possible thrombus. The distal stent is patent with 20% in-stent restenosis. This mainly supplies OM1 and is no longer supplying the posterior AV groove artery retrograde.  SVG to diagonal: Occluded which is a new finding.  LIMA to LAD: Patent with no significant disease. This gives reasonable collaterals to the whole left circumflex distribution as well as the diagonal branches.  Left ventriculography: Left ventricular systolic function is normal , LVEF is estimated at 55 %, there is no significant mitral regurgitation   Final Conclusions:  1. Severe underlying three-vessel  coronary artery disease. Patent LIMA to LAD which supplies collaterals to the diagonal and left circumflex distribution. The SVG to diagonal is now occluded which is a new finding. SVG to OM 3 is chronically occluded. SVG to OM1 has new severe disease proximally with possible thrombus in the midsegment. 2. Normal LV systolic function and left ventricular end-diastolic pressure.  Recommendations: This is a difficult situation. PCI on SVG to OM1 is possible. However, the graft is supplying a relatively small OM1 distribution. It has severe disease proximally and possible thrombus in the midsegment. Thus, the risks probably outweigh the benefits. I recommend optimizing medical therapy. I resumed long-acting nitroglycerin and added Ranexa. If we are not able to control angina medically, redo CABG should be considered for symptom relief. There  is probably no mortality benefit from redo CABG given that his LIMA is widely patent.  Kathlyn Sacramento MD, [facility] 09/19/2014, 2:15 PM     Impression:  He has 70% distal left main stenosis with occluded LAD and LCX arteries and 70-80% proximal nondominant RCA stenosis. He has a large patent LIMA supplying the LAD as well as collaterals to the LCX territory and the diagonal. The diagonal does not fill very well and I can't tell if it would be graftable. The OM1 graft is severely diseased but the distal vessel may be graftable. The OM3 is a large vessel filling by collaterals and communicates with a moderate OM2. He is having lifestyle-limiting chest pain symptoms at this time on optimized medical therapy. I agree that redo CABG may not have a survival advantage with a patent LIMA supplying so much territory but it may resolve his anginal symptoms. I think the OM1 and OM3 are graftable, and possibly the diagonal. I think there would be adequate conduit. The right GSV was not used because when we examined it adjacent to the knee it was small. The left GSV was removed for  surgery. Review of his prior arterial dopplers from 2010 show that both the left and right radial arteries could be removed and recent repeat arterial dopplers confirm this. His right IMA could also be used if it is patent. I discussed the operative procedure with the patient and family including alternatives, benefits and risks; including but not limited to hand ischemia or paresthesias,  bleeding, blood transfusion, infection, stroke, myocardial infarction, graft failure, heart block requiring a permanent pacemaker, organ dysfunction, and death.  Ricardo Jimenez understands and agrees to proceed.        Plan:   Redo CABG using bilateral radial arteries and a right internal mammary artery graft.

## 2015-01-27 NOTE — Brief Op Note (Addendum)
01/27/2015      [address]       [place] [address]             [phone]     01/27/2015  4:08 PM  PATIENT:  Ricardo Jimenez  64 y.o. male  PRE-OPERATIVE DIAGNOSIS:  CAD  POST-OPERATIVE DIAGNOSIS:  CAD  PROCEDURE:  Procedure(s): REDO CORONARY ARTERY BYPASS GRAFTING (CABG)X3 FREE RIMA-diag;RIGHT RADIAL -OM3;LEFT RADIAL-OM1 BILATERAL RADIAL ARTERY HARVEST TRANSESOPHAGEAL ECHOCARDIOGRAM (TEE)  SURGEON:  Surgeon(s): [name], MD  PHYSICIAN ASSISTANT: WAYNE GOLD PA-C  ANESTHESIA:   general  PATIENT CONDITION:  ICU - intubated and hemodynamically stable.  PRE-OPERATIVE WEIGHT: 98kg  EBL: SEE ANEST/PERFUSION RECORDS  COMPLICATIONS: NO KNOWN

## 2015-01-27 NOTE — OR Nursing (Signed)
16:25 - 1st call to SICU charge nurse

## 2015-01-27 NOTE — Progress Notes (Signed)
  Echocardiogram Echocardiogram Transesophageal has been performed.  Bobbye Charleston 01/27/2015, 8:51 AM

## 2015-01-27 NOTE — Anesthesia Procedure Notes (Signed)
Procedure Name: Intubation Date/Time: 01/27/2015 7:58 AM Performed by: Ollen Bowl Pre-anesthesia Checklist: Patient identified, Emergency Drugs available, Suction available, Patient being monitored and Timeout performed Patient Re-evaluated:Patient Re-evaluated prior to inductionOxygen Delivery Method: Circle system utilized and Simple face mask Preoxygenation: Pre-oxygenation with 100% oxygen Intubation Type: IV induction Ventilation: Mask ventilation without difficulty Laryngoscope Size: Mac and 4 Grade View: Grade II Tube type: Subglottic suction tube Tube size: 8.0 mm Number of attempts: 1 Airway Equipment and Method: Patient positioned with wedge pillow and Stylet Placement Confirmation: ETT inserted through vocal cords under direct vision,  positive ETCO2 and breath sounds checked- equal and bilateral Secured at: 22 cm Tube secured with: Tape Dental Injury: Teeth and Oropharynx as per pre-operative assessment

## 2015-01-27 NOTE — Op Note (Signed)
CARDIOVASCULAR SURGERY OPERATIVE NOTE  01/27/2015  Surgeon:  [name], MD  First Assistant: Jadene Pierini,  [facility]   Preoperative Diagnosis:  Severe multi-vessel coronary artery disease   Postoperative Diagnosis:  Same   Procedure:  1. Redo Median Sternotomy 2. Extracorporeal circulation 3.   Redo Coronary artery bypass grafting x 3   Free right internal mammary graft to the diagonal  Right radial artery graft to the OM3  Left radial artery graft to the OM1  4. Insertion of left femoral arterial line for blood pressure monitoring  Anesthesia:  General Endotracheal   Clinical History/Surgical Indication:  The patient is a 64 year old gentleman who underwent CABG x 4 by me in 06/2009. He had a LIMA to the LAD, SVG to the diagonal, SVG to OM1, and a SVG to OM3. He presented in 12/2013 with unstable angina and was admitted with NSTEMI. Cardiac cath showed the SVG to OM3 to be occluded at its ostium. The SVG to the OM1 had mild plaque throughout with a 99% distal stenosis. The SVG to Diag had diffuse 40% mid stenosis and the LIMA to the LAD was widely patent. This was treated with a DES to the distal OM1 and PTCA of the mid AV groove LCX. He did well until November 2015 when he presented with a several day history of exertional angina progressing to nocturnal angina and ruled in for NSTEMI. The SVG to OM1 now had new 95% proximal stenosis and hazy disease in the midsegment with possible thrombus. The distal stent was patent. The SVG to the diagonal was occluded which was new. The patent LIMA to the LAD was supplying collaterals to the diagonal and the LCX distribution. Optimized medical therapy was recommended with long-acting NTG and Ranexa. He says that this has improved his symptoms but he is still very limited in what he can do before he gets chest pain. Cold weather and heavy meals bring on  pain. He describes the pain as mild and relieved with rest. He denies rest angina.  He has 70% distal left main stenosis with occluded LAD and LCX arteries and 70-80% proximal nondominant RCA stenosis. He has a large patent LIMA supplying the LAD as well as collaterals to the LCX territory and the diagonal. The diagonal does not fill very well and I can't tell if it would be graftable. The OM1 graft is severely diseased but the distal vessel may be graftable. The OM3 is a large vessel filling by collaterals and communicates with a moderate OM2. He is having lifestyle-limiting chest pain symptoms at this time on optimized medical therapy. I agree that redo CABG may not have a survival advantage with a patent LIMA supplying so much territory but it may resolve his anginal symptoms. I think the OM1 and OM3 are graftable, and possibly the diagonal. I think there would be adequate conduit. The right GSV was not used because when we examined it adjacent to the knee it was small. The left GSV was removed for surgery. Review of his prior arterial dopplers from 2010 show that both the left and right radial arteries could be removed and recent repeat arterial dopplers confirm this. His right IMA could also be used if it is patent. I discussed the operative procedure with the patient and family including alternatives, benefits and risks; including but not limited to hand ischemia or paresthesias, bleeding, blood transfusion, infection, stroke, myocardial infarction, graft failure, heart block requiring a permanent pacemaker, organ dysfunction, and death. Ricardo Jimenez understands and agrees to proceed.    Preparation:  The patient was seen in the preoperative holding area and the correct patient, correct operation were confirmed with the patient after reviewing the medical record and catheterization. The consent was signed by me. Preoperative antibiotics were given. A pulmonary arterial line was placed by the  anesthesia team. The patient was taken back to the operating room and positioned supine on the operating room table. After being placed under general endotracheal anesthesia by the anesthesia team a foley catheter was placed. A left femoral arterial line was placed using Seldinger technique without difficulty. Both arms were placed out on arm boards. The neck, chest, abdomen, both arms and both legs were prepped with betadine soap and solution and draped in the usual sterile manner. A surgical time-out was taken and the correct patient and operative procedure were confirmed with the nursing and anesthesia staff.   Radial artery Harvest:  Both radial arteries were harvested at the same time through a longitudinal incision over the arteries. They were harvested as pedicles with the accompanying veins. Both arteries were medium sized vessels. The harmonic scalpel was used to ligate and divide the branches which were later clipped. The arteries were harvested from the wrist crease to the bifurcation from the brachial artery. Both arteries were clamped with vascular bulldogs and there was still a doppler signal in the radial artery distal to the clamp. The arteries were suture ligated proximally and distally and divided. They were flushed with papaverine saline solution and stored in a cup of the same solution until use. Hemostasis was achieved and the incisions were closed in layers using continuous 2-0 vicryl suture to approximate the muscle, 3-0 vicryl suture to approximate the subcutaneous tissue and 3-0 vicryl subcuticular skin closure. The arms were dressed with sterile gauze and wrapped with an ACE wrap. The arms were repositioned at the sides.   Cardiopulmonary Bypass:  A redo median sternotomy was performed using the oscillating saw without difficulty. The right ventricle was adherent to the back of the sternum but was not injured and separated from the sternum using cautery. The right interna mammary  artery was harvested. Then dissection was performed to expose the right atrium and ascending aorta. The adhesions were moderately dense.  The ascending aorta was of normal size and had no palpable plaque. There were no contraindications to aortic cannulation or cross-clamping. The patient was fully systemically heparinized and the ACT was maintained > 400 sec. The proximal aortic arch was cannulated with a 20 F aortic cannula for arterial inflow. Venous cannulation was performed via the right atrial appendage using a two-staged venous cannula. An antegrade cardioplegia/vent cannula was inserted into the mid-ascending aorta. A retrograde cardioplegia cannula was inserted into the coronary sinus via the right atrium. The anterior portion of the heart was dissected from the pericardial adhesions and the LAD was located. It was traced proximally to the insertion site of the LIMA graft. The LIMA graft was identified as it entered the pericardium laterally and was carefully encircled and clamped with an atraumatic vascular bulldog. Aortic occlusion was performed with a single cross-clamp. Systemic cooling to 28 degrees Centigrade and topical cooling of the heart with iced saline were used. Hyperkalemic antegrade and retrograde cold blood cardioplegia was used to induce diastolic arrest and was then given at about 20 minute intervals throughout the period of arrest to maintain myocardial temperature at or below 10 degrees centigrade. A temperature probe was inserted into the interventricular  septum and an insulating pad was placed in the pericardium.   Right internal mammary harvest:  The rigtht side of the sternum was retracted using the Rultract retractor. The right internal mammary artery was harvested as a free graft since it was not long enough to reach any of the arteries as a pedicled graft. All side branches were clipped. It was a medium-sized vessel of good quality with excellent blood flow. It was ligated  proximally anddistally and divided. It was flushed with papaverine solution and itt was sprayed with topical papaverine solution to prevent vasospasm.   Coronary arteries:  The coronary arteries were examined.   LAD:  Large vessel with no visible disease. The diagonal was a medium sized vessel with no distal disease.  LCX:  The OM1 was identified just distal to the old vein graft anastomosis and it was mildly diseased but graftable here. It quickly bifurcated after this into two smaller branches that did not appear graftable. The OM2 was a medium sized vessel with mild distal disease. The OM3 was large with no distal disease and communicated with the OM2 on cath with no obstruction between them.   RCA:  Small and non-dominant. Not visible on the surface of the heart.   Grafts:  1. Free RIIMA to the Diagonal: 1.6 mm. It was sewn end to side using 8-0 prolene continuous suture. 2. Right radial artery to the OM3:  1.75 mm. It was sewn end to side using 8-0 prolene continuous suture. 3    Left radial artery graft to the OM1:  1.75 mm. It was sewn end to          side using 8-0 prolene continuous suture.   The proximal  graft anastomoses were performed to the hoods of the old vein grafts using continuous 7-0 prolene suture.  Graft markers were placed around the proximal anastomoses.   Completion:  The patient was rewarmed to 37 degrees Centigrade. The clamp was removed from the LIMA pedicle and there was rapid warming of the septum and return of ventricular fibrillation. The crossclamp was removed with a time of 164 minutes. There was spontaneous return of sinus rhythm. The distal and proximal anastomoses were checked for hemostasis. The position of the grafts was satisfactory. Two temporary epicardial pacing wires were placed on the right atrium and two on the right ventricle. The patient was weaned from CPB without difficulty on dopamine 3. CPB time was 208 minutes. Cardiac output was 5 LPM.  TEE showed normal LV function. Heparin was fully reversed with protamine and the aortic and venous cannulas removed. Hemostasis was achieved. Mediastinal and bilateral pleural drainage tubes were placed. The sternum was closed with double #6 stainless steel wires. The fascia was closed with continuous # 1 vicryl suture. The subcutaneous tissue was closed with 2-0 vicryl continuous suture. The skin was closed with 3-0 vicryl subcuticular suture. All sponge, needle, and instrument counts were reported correct at the end of the case. Dry sterile dressings were placed over the incisions and around the chest tubes which were connected to pleurevac suction. The patient was then transported to the surgical intensive care unit in critical but stable condition.

## 2015-01-27 NOTE — Progress Notes (Signed)
Patient ID: Ricardo Jimenez, male   DOB: [birth year], 64 y.o.   MRN: [id number] EVENING ROUNDS NOTE :     [address]       [place] [address]             [phone]                 Day of Surgery Procedure(s) (LRB): REDO CORONARY ARTERY BYPASS GRAFTING (CABG) x  three , using right internal mammary artery and bilateral radial arteries (N/A) BILATERAL RADIAL ARTERY HARVEST (Bilateral) TRANSESOPHAGEAL ECHOCARDIOGRAM (TEE) (N/A)  Total Length of Stay:  LOS: 0 days  BP 118/64 mmHg  Pulse 77  Temp(Src) 95.4 F (35.2 C) (Oral)  Resp 17  Wt 216 lb 8 oz (98.204 kg)  SpO2 94%  .Intake/Output      04/06 0701 - 04/07 0700 04/07 0701 - 04/08 0700   I.V. (mL/kg)  4000 (40.7)   Blood  787   IV Piggyback  250   Total Intake(mL/kg)  5037 (51.3)   Urine (mL/kg/hr)  1520 (1.3)   Blood  1815 (1.6)   Total Output   3335   Net   +1702          . sodium chloride    . [START ON 01/28/2015] sodium chloride    . sodium chloride    . dexmedetomidine    . insulin (NOVOLIN-R) infusion    . lactated ringers    . lactated ringers    . nitroGLYCERIN    . phenylephrine (NEO-SYNEPHRINE) Adult infusion       Lab Results  Component Value Date   WBC 6.0 01/25/2015   HGB 10.9* 01/27/2015   HCT 32.0* 01/27/2015   PLT 127* 01/27/2015   GLUCOSE 118* 01/27/2015   CHOL 193 09/19/2014   TRIG 280* 09/19/2014   HDL 36* 09/19/2014   LDLCALC 101* 09/19/2014   ALT 20 01/25/2015   AST 25 01/25/2015   NA 141 01/27/2015   K 3.6 01/27/2015   CL 104 01/27/2015   CREATININE 1.20 01/27/2015   BUN 22 01/27/2015   CO2 21 01/25/2015   TSH 5.598 Test methodology is 3rd generation TSH 07/22/2009   INR 1.05 01/25/2015   HGBA1C 5.6 01/25/2015   Stable, early postop, not bleeding   Grace Isaac MD  Beeper [phone] Office [phone] 01/27/2015 6:30 PM

## 2015-01-27 NOTE — Progress Notes (Signed)
Pt reports taking Lopressor 25mg  at 0500 this morning as it is his regular home medication; therefore, 12.5 dose not given.

## 2015-01-27 NOTE — Transfer of Care (Signed)
Immediate Anesthesia Transfer of Care Note  Patient: Ricardo Jimenez  Procedure(s) Performed: Procedure(s) with comments: REDO CORONARY ARTERY BYPASS GRAFTING (CABG) x  three , using right internal mammary artery and bilateral radial arteries (N/A) BILATERAL RADIAL ARTERY HARVEST (Bilateral) - BILATERAL RADIAL HARVEST & RIGHT IMA TRANSESOPHAGEAL ECHOCARDIOGRAM (TEE) (N/A)  Patient Location: PACU and SICU  Anesthesia Type:General  Level of Consciousness: Patient remains intubated per anesthesia plan  Airway & Oxygen Therapy: Patient remains intubated per anesthesia plan and Patient placed on Ventilator (see vital sign flow sheet for setting)  Post-op Assessment: Report given to RN and Post -op Vital signs reviewed and stable  Post vital signs: Reviewed and stable  Last Vitals:  Filed Vitals:   01/27/15 0613  BP: 97/59  Pulse: 62  Temp: 36.4 C  Resp: 18    Complications: No apparent anesthesia complications

## 2015-01-27 NOTE — Interval H&P Note (Signed)
History and Physical Interval Note:  01/27/2015 6:52 AM  Ricardo Jimenez  has presented today for surgery, with the diagnosis of CAD  The various methods of treatment have been discussed with the patient and family. After consideration of risks, benefits and other options for treatment, the patient has consented to  Procedure(s) with comments: REDO CORONARY ARTERY BYPASS GRAFTING (CABG) (N/A) BILATERAL RADIAL ARTERY HARVEST (Bilateral) - BILATERAL RADIAL HARVEST & RIGHT IMA TRANSESOPHAGEAL ECHOCARDIOGRAM (TEE) (N/A) as a surgical intervention .  The patient's history has been reviewed, patient examined, no change in status, stable for surgery.  I have reviewed the patient's chart and labs.  Questions were answered to the patient's satisfaction.     [name]

## 2015-01-28 ENCOUNTER — Encounter (HOSPITAL_COMMUNITY): Payer: Self-pay | Admitting: Surgery

## 2015-01-28 ENCOUNTER — Inpatient Hospital Stay (HOSPITAL_COMMUNITY): Payer: Medicare Other

## 2015-01-28 LAB — GLUCOSE, CAPILLARY
GLUCOSE-CAPILLARY: 100 mg/dL — AB (ref 70–99)
GLUCOSE-CAPILLARY: 101 mg/dL — AB (ref 70–99)
GLUCOSE-CAPILLARY: 112 mg/dL — AB (ref 70–99)
GLUCOSE-CAPILLARY: 137 mg/dL — AB (ref 70–99)
Glucose-Capillary: 104 mg/dL — ABNORMAL HIGH (ref 70–99)
Glucose-Capillary: 145 mg/dL — ABNORMAL HIGH (ref 70–99)
Glucose-Capillary: 129 mg/dL — ABNORMAL HIGH (ref 70–99)
Glucose-Capillary: 104 mg/dL — ABNORMAL HIGH (ref 70–99)
Glucose-Capillary: 108 mg/dL — ABNORMAL HIGH (ref 70–99)
Glucose-Capillary: 110 mg/dL — ABNORMAL HIGH (ref 70–99)
Glucose-Capillary: 114 mg/dL — ABNORMAL HIGH (ref 70–99)
Glucose-Capillary: 116 mg/dL — ABNORMAL HIGH (ref 70–99)
Glucose-Capillary: 118 mg/dL — ABNORMAL HIGH (ref 70–99)
Glucose-Capillary: 120 mg/dL — ABNORMAL HIGH (ref 70–99)
Glucose-Capillary: 124 mg/dL — ABNORMAL HIGH (ref 70–99)
Glucose-Capillary: 125 mg/dL — ABNORMAL HIGH (ref 70–99)
Glucose-Capillary: 126 mg/dL — ABNORMAL HIGH (ref 70–99)
Glucose-Capillary: 138 mg/dL — ABNORMAL HIGH (ref 70–99)
Glucose-Capillary: 99 mg/dL (ref 70–99)
Glucose-Capillary: 125 mg/dL — ABNORMAL HIGH (ref 70–99)

## 2015-01-28 LAB — CBC
HCT: 27.1 % — ABNORMAL LOW (ref 39.0–52.0)
HEMATOCRIT: 28 % — AB (ref 39.0–52.0)
HEMOGLOBIN: 9.5 g/dL — AB (ref 13.0–17.0)
HEMOGLOBIN: 9.2 g/dL — AB (ref 13.0–17.0)
MCH: 31.4 pg (ref 26.0–34.0)
MCH: 31.6 pg (ref 26.0–34.0)
MCHC: 33.9 g/dL (ref 30.0–36.0)
MCHC: 33.9 g/dL (ref 30.0–36.0)
MCV: 92.4 fL (ref 78.0–100.0)
MCV: 93.1 fL (ref 78.0–100.0)
PLATELETS: 143 10*3/uL — AB (ref 150–400)
Platelets: 134 10*3/uL — ABNORMAL LOW (ref 150–400)
RBC: 3.03 MIL/uL — AB (ref 4.22–5.81)
RBC: 2.91 MIL/uL — AB (ref 4.22–5.81)
RDW: 13.7 % (ref 11.5–15.5)
RDW: 14.1 % (ref 11.5–15.5)
WBC: 6.5 10*3/uL (ref 4.0–10.5)
WBC: 7.7 10*3/uL (ref 4.0–10.5)

## 2015-01-28 LAB — POCT I-STAT 3, ART BLOOD GAS (G3+)
ACID-BASE DEFICIT: 4 mmol/L — AB (ref 0.0–2.0)
Acid-base deficit: 6 mmol/L — ABNORMAL HIGH (ref 0.0–2.0)
Acid-base deficit: 6 mmol/L — ABNORMAL HIGH (ref 0.0–2.0)
BICARBONATE: 19.9 meq/L — AB (ref 20.0–24.0)
Bicarbonate: 20.3 mEq/L (ref 20.0–24.0)
Bicarbonate: 21.4 mEq/L (ref 20.0–24.0)
O2 SAT: 93 %
O2 SAT: 93 %
O2 Saturation: 94 %
PCO2 ART: 39.2 mmHg (ref 35.0–45.0)
PCO2 ART: 40.3 mmHg (ref 35.0–45.0)
PH ART: 7.314 — AB (ref 7.350–7.450)
PO2 ART: 72 mmHg — AB (ref 80.0–100.0)
Patient temperature: 36.9
Patient temperature: 37.1
TCO2: 23 mmol/L (ref 0–100)
TCO2: 21 mmol/L (ref 0–100)
TCO2: 22 mmol/L (ref 0–100)
pCO2 arterial: 40.4 mmHg (ref 35.0–45.0)
pH, Arterial: 7.31 — ABNORMAL LOW (ref 7.350–7.450)
pH, Arterial: 7.333 — ABNORMAL LOW (ref 7.350–7.450)
pO2, Arterial: 72 mmHg — ABNORMAL LOW (ref 80.0–100.0)
pO2, Arterial: 78 mmHg — ABNORMAL LOW (ref 80.0–100.0)

## 2015-01-28 LAB — POCT I-STAT, CHEM 8
BUN: 17 mg/dL (ref 6–23)
CALCIUM ION: 1.16 mmol/L (ref 1.13–1.30)
CHLORIDE: 101 mmol/L (ref 96–112)
CREATININE: 1.3 mg/dL (ref 0.50–1.35)
GLUCOSE: 144 mg/dL — AB (ref 70–99)
HCT: 27 % — ABNORMAL LOW (ref 39.0–52.0)
Hemoglobin: 9.2 g/dL — ABNORMAL LOW (ref 13.0–17.0)
Potassium: 4.3 mmol/L (ref 3.5–5.1)
Sodium: 138 mmol/L (ref 135–145)
TCO2: 20 mmol/L (ref 0–100)

## 2015-01-28 LAB — BASIC METABOLIC PANEL
Anion gap: 3 — ABNORMAL LOW (ref 5–15)
BUN: 18 mg/dL (ref 6–23)
CHLORIDE: 110 mmol/L (ref 96–112)
CO2: 26 mmol/L (ref 19–32)
Calcium: 7.6 mg/dL — ABNORMAL LOW (ref 8.4–10.5)
Creatinine, Ser: 1.3 mg/dL (ref 0.50–1.35)
GFR calc non Af Amer: 56 mL/min — ABNORMAL LOW (ref >90)
GFR, EST AFRICAN AMERICAN: 65 mL/min — AB (ref >90)
GLUCOSE: 136 mg/dL — AB (ref 70–99)
POTASSIUM: 4.5 mmol/L (ref 3.5–5.1)
Sodium: 139 mmol/L (ref 135–145)

## 2015-01-28 LAB — MAGNESIUM
Magnesium: 2.5 mg/dL (ref 1.5–2.5)
Magnesium: 2.2 mg/dL (ref 1.5–2.5)

## 2015-01-28 LAB — CREATININE, SERUM
CREATININE: 1.38 mg/dL — AB (ref 0.50–1.35)
GFR calc Af Amer: 61 mL/min — ABNORMAL LOW (ref >90)
GFR, EST NON AFRICAN AMERICAN: 53 mL/min — AB (ref >90)

## 2015-01-28 MED ORDER — ENOXAPARIN SODIUM 40 MG/0.4ML ~~LOC~~ SOLN
40.0000 mg | Freq: Every day | SUBCUTANEOUS | Status: DC
  Administered 2015-01-28 – 2015-01-31 (×4): 40 mg via SUBCUTANEOUS
  Filled 2015-01-28 (×5): qty 0.4

## 2015-01-28 MED ORDER — INSULIN DETEMIR 100 UNIT/ML ~~LOC~~ SOLN: 30.0000 [IU] | Freq: Every day | SUBCUTANEOUS | Status: DC

## 2015-01-28 MED ORDER — CETYLPYRIDINIUM CHLORIDE 0.05 % MT LIQD: 7.0000 mL | Freq: Two times a day (BID) | OROMUCOSAL | Status: DC

## 2015-01-28 MED ORDER — FUROSEMIDE 10 MG/ML IJ SOLN
40.0000 mg | Freq: Once | INTRAMUSCULAR | Status: AC
  Administered 2015-01-28: 40 mg via INTRAVENOUS

## 2015-01-28 MED ORDER — INSULIN DETEMIR 100 UNIT/ML ~~LOC~~ SOLN
30.0000 [IU] | Freq: Every day | SUBCUTANEOUS | Status: DC
  Administered 2015-01-28: 30 [IU] via SUBCUTANEOUS
  Filled 2015-01-28 (×2): qty 0.3

## 2015-01-28 MED ORDER — ISOSORBIDE MONONITRATE ER 30 MG PO TB24
30.0000 mg | ORAL_TABLET | Freq: Every day | ORAL | Status: DC
  Administered 2015-01-28 – 2015-02-01 (×5): 30 mg via ORAL
  Filled 2015-01-28 (×5): qty 1

## 2015-01-28 MED ORDER — INSULIN ASPART 100 UNIT/ML ~~LOC~~ SOLN
0.0000 [IU] | SUBCUTANEOUS | Status: DC
  Administered 2015-01-28 – 2015-01-29 (×5): 2 [IU] via SUBCUTANEOUS

## 2015-01-28 MED FILL — Sodium Chloride IV Soln 0.9%: INTRAVENOUS | Qty: 2000 | Status: AC

## 2015-01-28 MED FILL — Lidocaine HCl IV Inj 20 MG/ML: INTRAVENOUS | Qty: 5 | Status: AC

## 2015-01-28 MED FILL — Sodium Bicarbonate IV Soln 8.4%: INTRAVENOUS | Qty: 50 | Status: AC

## 2015-01-28 MED FILL — Electrolyte-R (PH 7.4) Solution: INTRAVENOUS | Qty: 5000 | Status: AC

## 2015-01-28 MED FILL — Heparin Sodium (Porcine) Inj 1000 Unit/ML: INTRAMUSCULAR | Qty: 20 | Status: AC

## 2015-01-28 MED FILL — Mannitol IV Soln 20%: INTRAVENOUS | Qty: 500 | Status: AC

## 2015-01-28 NOTE — Progress Notes (Signed)
Patient ID: Ricardo Jimenez, male   DOB: 1951-02-23, 64 y.o.   MRN: [id number]  SICU Evening Rounds:  Hemodynamically stable in sinus rhythm.  Diuresed today  CBC    Component Value Date/Time   WBC 7.7 01/28/2015 1607   RBC 2.91* 01/28/2015 1607   HGB 9.2* 01/28/2015 1609   HCT 27.0* 01/28/2015 1609   PLT 143* 01/28/2015 1607   MCV 93.1 01/28/2015 1607   MCH 31.6 01/28/2015 1607   MCHC 33.9 01/28/2015 1607   RDW 14.1 01/28/2015 1607   LYMPHSABS 2.1 12/24/2013 0853   MONOABS 0.5 12/24/2013 0853   EOSABS 0.3 12/24/2013 0853   BASOSABS 0.0 12/24/2013 0853    BMET    Component Value Date/Time   NA 138 01/28/2015 1609   K 4.3 01/28/2015 1609   CL 101 01/28/2015 1609   CO2 26 01/28/2015 0340   GLUCOSE 144* 01/28/2015 1609   BUN 17 01/28/2015 1609   CREATININE 1.30 01/28/2015 1609   CALCIUM 7.6* 01/28/2015 0340   GFRNONAA 53* 01/28/2015 1607   GFRAA 61* 01/28/2015 1607    Stable postop course.

## 2015-01-28 NOTE — Progress Notes (Signed)
[facility] DAILY ICU PROGRESS NOTE                   [address]            [place], [address]          [phone]   1 Day Post-Op Procedure(s) (LRB): REDO CORONARY ARTERY BYPASS GRAFTING (CABG) x  three , using right internal mammary artery and bilateral radial arteries (N/A) BILATERAL RADIAL ARTERY HARVEST (Bilateral) TRANSESOPHAGEAL ECHOCARDIOGRAM (TEE) (N/A)  Total Length of Stay:  LOS: 1 day   Subjective: Feels pretty well, minor pain/minor nausea  Objective: Vital signs in last 24 hours: Temp:  [95.2 F (35.1 C)-100 F (37.8 C)] 100 F (37.8 C) (04/08 0700) Pulse Rate:  [77-105] 103 (04/08 0700) Cardiac Rhythm:  [-] Sinus tachycardia (04/08 0600) Resp:  [14-29] 20 (04/08 0700) BP: (118)/(64) 118/64 mmHg (04/07 1805) SpO2:  [91 %-100 %] 97 % (04/08 0700) Arterial Line BP: (84-237)/(35-232) 109/49 mmHg (04/08 0700) FiO2 (%):  [40 %-60 %] 40 % (04/08 0000) Weight:  [222 lb 10.6 oz (101 kg)] 222 lb 10.6 oz (101 kg) (04/08 0536)  Filed Weights   01/27/15 0613 01/28/15 0536  Weight: 216 lb 8 oz (98.204 kg) 222 lb 10.6 oz (101 kg)    Weight change: 6 lb 2.6 oz (2.796 kg)   Hemodynamic parameters for last 24 hours: PAP: (18-34)/(6-19) 23/9 mmHg CO:  [4.8 L/min-7.6 L/min] 7.6 L/min CI:  [2.5 L/min/m2-3.6 L/min/m2] 3.6 L/min/m2  Intake/Output from previous day: 04/07 0701 - 04/08 0700 In: 7375.6 [I.V.:5818.6; Blood:787; NG/GT:20; IV Piggyback:750] Out: 5640 [Urine:3295; Blood:1815; Chest Tube:530]  Intake/Output this shift:    Current Meds: Scheduled Meds: . acetaminophen  1,000 mg Oral 4 times per day   Or  . acetaminophen (TYLENOL) oral liquid 160 mg/5 mL  1,000 mg Per Tube 4 times per day  . antiseptic oral rinse  7 mL Mouth Rinse QID  . aspirin EC  325 mg Oral Daily   Or  . aspirin  324 mg Per Tube Daily  . atorvastatin  80 mg Oral q1800  . bisacodyl  10 mg Oral Daily   Or  . bisacodyl  10 mg Rectal Daily  . cefUROXime (ZINACEF)  IV  1.5  g Intravenous Q12H  . chlorhexidine  15 mL Mouth Rinse BID  . docusate sodium  200 mg Oral Daily  . famotidine (PEPCID) IV  20 mg Intravenous Q12H  . insulin regular  0-10 Units Intravenous TID WC  . metoprolol tartrate  12.5 mg Oral BID   Or  . metoprolol tartrate  12.5 mg Per Tube BID  . [START ON 01/29/2015] pantoprazole  40 mg Oral Daily  . sodium chloride  3 mL Intravenous Q12H   Continuous Infusions: . sodium chloride    . sodium chloride    . sodium chloride 10 mL/hr at 01/27/15 2100  . dexmedetomidine Stopped (01/27/15 2250)  . DOPamine 3 mcg/kg/min (01/28/15 0600)  . insulin (NOVOLIN-R) infusion 1.6 Units/hr (01/28/15 0546)  . lactated ringers 20 mL/hr at 01/27/15 2100  . lactated ringers 20 mL/hr at 01/27/15 2100  . nitroGLYCERIN 5 mcg/min (01/28/15 0600)  . phenylephrine (NEO-SYNEPHRINE) Adult infusion 55 mcg/min (01/28/15 0600)   PRN Meds:.sodium chloride, albumin human, metoprolol, midazolam, morphine injection, ondansetron (ZOFRAN) IV, oxyCODONE, sodium chloride, traMADol  General appearance: alert, cooperative and no distress Neurologic: some tingling in fingers bilat, both hands with mod swelling, strength/motor function  equal and intact Heart: regular  rate and rhythm and friction rub heard with CT's in place Lungs: clear anteriorly Abdomen: soft, non-tender, non-distended Extremities: + edema in hands, LE's without Wound: dressings with/minor drainage  Lab Results: CBC: Recent Labs  01/27/15 1831 01/28/15 0340  WBC 8.3 6.5  HGB 10.9* 9.5*  HCT 31.8* 28.0*  PLT 133* 134*   BMET:  Recent Labs  01/25/15 1431  01/27/15 1628 01/27/15 1816 01/28/15 0340  NA 137  < > 138 141 139  K 4.6  < > 4.1 3.6 4.5  CL 108  < > 104  --  110  CO2 21  --   --   --  26  GLUCOSE 97  < > 146* 118* 136*  BUN 21  < > 22  --  18  CREATININE 1.37*  < > 1.20  --  1.30  CALCIUM 9.3  --   --   --  7.6*  < > = values in this interval not displayed.  PT/INR:  Recent Labs   01/27/15 1831  LABPROT 17.6*  INR 1.43   Radiology: Dg Chest Port 1 View  01/27/2015   CLINICAL DATA:  Status post CABG x3.  EXAM: PORTABLE CHEST - 1 VIEW  COMPARISON:  01/25/2015  FINDINGS: Interval median sternotomy. Endotracheal tube 2.7 cm above carina. Nasogastric tube extends beyond the inferior aspect of the film. Bilateral chest tubes. Right IJ Swan-Ganz catheter tip at proximal right pulmonary artery. Prior median sternotomy.  Normal heart size for level of inspiration. Probable trace bilateral pleural effusions. No pneumothorax. Low lung volumes with resultant pulmonary interstitial prominence. Patchy left base atelectasis.  IMPRESSION: Expected appearance after median sternotomy with low lung volumes and left base atelectasis.  Appropriate position of support apparatus, without pneumothorax.   Electronically Signed   By: Abigail Miyamoto M.D.   On: 01/27/2015 19:17     Assessment/Plan: S/P Procedure(s) (LRB): REDO CORONARY ARTERY BYPASS GRAFTING (CABG) x  three , using right internal mammary artery and bilateral radial arteries (N/A) BILATERAL RADIAL ARTERY HARVEST (Bilateral) TRANSESOPHAGEAL ECHOCARDIOGRAM (TEE) (N/A)  1 stable hemodynamics with dop/neo/nitro 2 Oxygenating well, routine pulm toilet/rehab 3 Imdur for radial art grafts 4 CT drainage 580 in ICU, decreasing , follow and poss D/C tubes later today 5 Mod expected ABL anemia- follow 6 UO good, may need some diuresis, Creat fairly stable 7 pain with good control 8 EKG- Inc RBBB, some inferior changes- monitor clinically  GOLD,WAYNE E 01/28/2015 7:33 AM

## 2015-01-28 NOTE — Procedures (Signed)
Extubation Procedure Note  Patient Details:   Name: Ricardo Jimenez DOB: Dec 03, 1950 MRN: [id number]   Airway Documentation:  Airway 8 mm (Active)  Secured at (cm) 22 cm 01/27/2015 11:24 PM  Measured From Lips 01/27/2015 11:24 PM  Secured Location Right 01/27/2015 11:24 PM  Secured By Rana Snare Tape 01/27/2015 11:24 PM  Site Condition Dry 01/27/2015 11:24 PM    Evaluation  O2 sats: stable throughout Complications: No apparent complications Patient did tolerate procedure well. Bilateral Breath Sounds: Rhonchi Suctioning: Airway Yes  Patient extubated to [facility] with all vitals within normal limits. Patient is practicing with incentive spirometer and flutter.   Maud Deed M 01/28/2015, 12:35 AM

## 2015-01-28 NOTE — Discharge Summary (Signed)
[address]       [place] [address]             [phone]            Discharge Summary  Name: Ricardo Jimenez DOB: December 16, 1950 64 y.o. MRN: [id number]   Admission Date: 01/27/2015 Discharge Date: 02/01/2015    Admitting Diagnosis:  Patient Active Problem List   Diagnosis Date Noted  . Non-STEMI (non-ST elevated myocardial infarction) 09/19/2014  . Unstable angina 09/18/2014  . Hematuria 09/18/2014  . NSTEMI (non-ST elevated myocardial infarction) 12/27/2013  . CAD (coronary artery disease) 12/16/2012  . Preop cardiovascular exam 01/01/2012  . Mixed hyperlipidemia 07/06/2009  . Essential hypertension, benign 07/06/2009  . CHEST PAIN 07/06/2009   Discharge Diagnosis:   Patient Active Problem List   Diagnosis Date Noted  . S/P CABG x 3 01/27/2015  . Non-STEMI (non-ST elevated myocardial infarction) 09/19/2014  . Unstable angina 09/18/2014  . Hematuria 09/18/2014  . NSTEMI (non-ST elevated myocardial infarction) 12/27/2013  . CAD (coronary artery disease) 12/16/2012  . Preop cardiovascular exam 01/01/2012  . Mixed hyperlipidemia 07/06/2009  . Essential hypertension, benign 07/06/2009  . CHEST PAIN 07/06/2009    Past Medical History  Diagnosis Date  . CAD (coronary artery disease)     a. 06/2009 Cath: 3VD, nondominant RCA;  b. 06/2009 CABG x 4: LIMA->LAD, VG->OM1, VG->OM3, VG->D1;  c. 03/2011 ETT: normal;  d. 12/2013 NSTEMI/Cath/PCI: LM nl, LAD 100p, LCX 100p, RCA nl, LIMA->LAD ok, VG->Diag 80m, VG->OM1 99d(3.5x18 Xience DES), VG->OM3 100d, EF 55%.  [name] Hypercholesterolemia   . PUD (peptic ulcer disease)   . 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 GERD (gastroesophageal reflux disease)   . Osteoarthritis   . Horseshoe kidney   . HTN (hypertension)     pt denies this  . Heart murmur     as a child   Procedures: REDO CORONARY ARTERY BYPASS GRAFTING x 3 - 01/27/2015  Free right internal mammary artery to diagonal  Right radial artery to  OM3  Left radial artery to OM1 BILATERAL RADIAL ARTERY HARVEST  HPI:  The patient is a 64 y.o. male who underwent CABG x 4 by Ricardo Jimenez in 06/2009. He had a LIMA to the LAD, SVG to the diagonal, SVG to OM1, and a SVG to OM3. He presented in 12/2013 with unstable angina and was admitted with NSTEMI. Cardiac cath showed the SVG to OM3 to be occluded at its ostium. The SVG to the OM1 had mild plaque throughout with a 99% distal stenosis. The SVG to Diag had diffuse 40% mid stenosis and the LIMA to the LAD was widely patent. This was treated with a drug eluting stent to the distal OM1 and PTCA of the mid AV groove left circumflex. He did well until November 2015, when he presented with a several day history of exertional angina progressing to nocturnal angina and ruled in for NSTEMI. The SVG to OM1 now had new 95% proximal stenosis and hazy disease in the midsegment with possible thrombus. The distal stent was patent. The SVG to the diagonal was occluded which was new. The patent LIMA to the LAD was supplying collaterals to the diagonal and the left circumflex distribution. Optimized medical therapy was recommended with long-acting nitrates and Ranexa. He says that this has improved his symptoms but he is still very limited in what he can do before he gets chest pain. Cold weather and heavy meals bring on  pain. He describes the pain as mild and relieved with rest. He denies rest angina. He was subsequently referred to Ricardo Jimenez for consideration of redo CABG.  It was Ricardo Jimenez opinion that the patient may not have a survival advantage with a patent LIMA supplying so much territory but it may resolve his anginal symptoms. He was felt to have adequate targets and conduit to proceed with surgery. All risks, benefits and alternatives of surgery were explained in detail, and the patient agreed to proceed.   Hospital Course:  The patient was admitted to [facility] on 01/27/2015. The patient was taken to the operating  room and underwent the above procedure.    The patient was extubated the morning of post operative day one without difficulty. He remained afebrile and hemodynamically stable. Ricardo Jimenez, a line, chest tubes, and foley were removed early in the post operative course. Lopressor was started and titrated accordingly. He was volume over loaded and diuresed. He had ABL anemia. He did not require a post op transfusion. His last H and H was 7.9 and 23.7.  This further dropped to 7.3 and he was transfused with 1 unit of PRBC.  His hemoglobin improved to 9.0.  He also had mild thrombocytopenia. His last platelet count was 105,000. He was weaned off the insulin drip. The patient's glucose remained well controlled. The patient's HGA1C pre op was 5.9.The patient was felt surgically stable for transfer from the ICU to PCTU for further convalescence on 01/29/2015. He continues to progress with cardiac rehab. He was ambulating on room air. He has been tolerating a diet and has had a bowel movement. Epicardial pacing wires will be removed prior to discharge. His UE wounds are clean,dry, and continuing to heal. He has no numbness in his fingers or hands. The patient is felt surgically stable for discharge today.   Recent vital signs:  Filed Vitals:   02/01/15 0439  BP: 146/60  Pulse: 74  Temp: 97.4 F (36.3 C)  Resp: 18    Recent laboratory studies:  CBC:  Recent Labs  01/31/15 0659 02/01/15 0500  WBC 6.0  --   HGB 7.3* 9.0*  HCT 22.3* 26.9*  PLT 170  --    BMET:   Recent Labs  01/31/15 0659  NA 137  K 3.6  CL 105  CO2 26  GLUCOSE 95  BUN 27*  CREATININE 1.06  CALCIUM 7.9*    PT/INR:  No results for input(s): LABPROT, INR in the last 72 hours.  Discharge Medications:      Medication List    STOP taking these medications        clopidogrel 75 MG tablet  Commonly known as:  PLAVIX     ranolazine 1000 MG SR tablet  Commonly known as:  RANEXA      TAKE these medications         acetaminophen 650 MG CR tablet  Commonly known as:  TYLENOL  Take 1,300 mg by mouth every morning.     aspirin 325 MG EC tablet  Take 1 tablet (325 mg total) by mouth daily.     atorvastatin 80 MG tablet  Commonly known as:  LIPITOR  Take 1 tablet (80 mg total) by mouth daily at 6 PM.     fenofibrate 54 MG tablet  Take 1 tablet (54 mg total) by mouth daily.     ferrous gluconate 324 MG tablet  Commonly known as:  FERGON  Take 1 tablet (324 mg  total) by mouth 2 (two) times daily with a meal.     furosemide 40 MG tablet  Commonly known as:  LASIX  Take 1 tablet (40 mg total) by mouth daily. For 3 Days     gabapentin 300 MG capsule  Commonly known as:  NEURONTIN  - Take 300-600 mg by mouth 2 (two) times daily. Take 300 mg in the morning and 600 mg at bedtime  -      glucosamine-chondroitin 500-400 MG tablet  Take 1 tablet by mouth 2 (two) times daily.     isosorbide mononitrate 30 MG 24 hr tablet  Commonly known as:  IMDUR  Take 1 tablet (30 mg total) by mouth daily.     lisinopril 10 MG tablet  Commonly known as:  PRINIVIL,ZESTRIL  Take 1 tablet (10 mg total) by mouth daily.     metoprolol tartrate 25 MG tablet  Commonly known as:  LOPRESSOR  Take 25 mg by mouth 2 (two) times daily.     multivitamin tablet  Take 1 tablet by mouth daily.     nitroGLYCERIN 0.4 MG SL tablet  Commonly known as:  NITROSTAT  Place 1 tablet (0.4 mg total) under the tongue every 5 (five) minutes as needed for chest pain.     oxyCODONE 5 MG immediate release tablet  Commonly known as:  Oxy IR/ROXICODONE  Take 1-2 tablets (5-10 mg total) by mouth every 3 (three) hours as needed for severe pain.     polyethylene glycol packet  Commonly known as:  MIRALAX / GLYCOLAX  Take 17 g by mouth daily.     potassium citrate 5 MEQ (540 MG) SR tablet  Commonly known as:  UROCIT-K  Take 5-10 mEq by mouth 2 (two) times daily. Take 2 tablets in the morning and 1 at night     ranitidine 150 MG tablet    Commonly known as:  ZANTAC  take 1 tablet by mouth twice a day     traMADol 50 MG tablet  Commonly known as:  ULTRAM  Take 50 mg by mouth 2 (two) times daily.     TYLENOL PM EXTRA STRENGTH 25-500 MG Tabs  Generic drug:  diphenhydramine-acetaminophen  Take 1 tablet by mouth at bedtime as needed. For sleep         The patient has been discharged on:   1.Beta Blocker:  Yes [ x  ]                              No   [   ]                              If No, reason:  2.Ace Inhibitor/ARB: Yes [ x  ]                                     No  [    ]                                     If No, reason:  3.Statin:   Yes [  x ]                  No  [   ]  If No, reason:  4.Ecasa:  Yes  [   x]                  No   [   ]                  If No, reason:  Discharge Instructions:  The patient is to refrain from driving, heavy lifting or strenuous activity.  May shower daily and clean incisions with soap and water.  May resume regular diet.   Follow Up: Follow-up Information    Follow up with [name], MD On 03/02/2015.   Specialty:  Cardiothoracic Surgery   Why:  Have a chest x-ray at 8:30, then see MD at 9:30    Contact information:   [address] [place] [address] [phone]       Follow up with Herminio Commons, MD On 02/16/2015.   Specialty:  Cardiology   Why:  Appointment time is at 10:20 am   Contact information:   [place] [address] [phone]         Follow-up Information    Follow up with [name], MD On 03/02/2015.   Specialty:  Cardiothoracic Surgery   Why:  Have a chest x-ray at 8:30, then see MD at 9:30    Contact information:   [address] [place] [address] [phone]       Follow up with Herminio Commons, MD On 02/16/2015.   Specialty:  Cardiology   Why:  Appointment time is at 10:20 am   Contact information:   [place]  [address] [phone]        Ellwood Handler PA-C 02/01/2015, 8:09 AM

## 2015-01-28 NOTE — Progress Notes (Signed)
1 Day Post-Op Procedure(s) (LRB): REDO CORONARY ARTERY BYPASS GRAFTING (CABG) x  three , using right internal mammary artery and bilateral radial arteries (N/A) BILATERAL RADIAL ARTERY HARVEST (Bilateral) TRANSESOPHAGEAL ECHOCARDIOGRAM (TEE) (N/A) Subjective:  No complaints  Objective: Vital signs in last 24 hours: Temp:  [95.2 F (35.1 C)-100 F (37.8 C)] 100 F (37.8 C) (04/08 0700) Pulse Rate:  [77-105] 103 (04/08 0700) Cardiac Rhythm:  [-] Sinus tachycardia (04/08 0600) Resp:  [14-29] 20 (04/08 0700) BP: (118)/(64) 118/64 mmHg (04/07 1805) SpO2:  [91 %-100 %] 97 % (04/08 0700) Arterial Line BP: (84-237)/(35-232) 109/49 mmHg (04/08 0700) FiO2 (%):  [40 %-60 %] 40 % (04/08 0000) Weight:  [101 kg (222 lb 10.6 oz)] 101 kg (222 lb 10.6 oz) (04/08 0536)  Hemodynamic parameters for last 24 hours: PAP: (18-34)/(6-19) 23/9 mmHg CO:  [4.8 L/min-7.6 L/min] 7.6 L/min CI:  [2.5 L/min/m2-3.6 L/min/m2] 3.6 L/min/m2  Intake/Output from previous day: 04/07 0701 - 04/08 0700 In: 7375.6 [I.V.:5818.6; Blood:787; NG/GT:20; IV Piggyback:750] Out: 5640 [Urine:3295; Blood:1815; Chest Tube:530] Intake/Output this shift:    General appearance: alert and cooperative Neurologic: intact Heart: regular rate and rhythm, S1, S2 normal, no murmur, click, rub or gallop Lungs: clear to auscultation bilaterally Extremities: edema moderate, hands pind and warm Wound: dressings dry  Lab Results:  Recent Labs  01/27/15 1831 01/28/15 0340  WBC 8.3 6.5  HGB 10.9* 9.5*  HCT 31.8* 28.0*  PLT 133* 134*   BMET:  Recent Labs  01/25/15 1431  01/27/15 1628 01/27/15 1816 01/28/15 0340  NA 137  < > 138 141 139  K 4.6  < > 4.1 3.6 4.5  CL 108  < > 104  --  110  CO2 21  --   --   --  26  GLUCOSE 97  < > 146* 118* 136*  BUN 21  < > 22  --  18  CREATININE 1.37*  < > 1.20  --  1.30  CALCIUM 9.3  --   --   --  7.6*  < > = values in this interval not displayed.  PT/INR:  Recent Labs  01/27/15 1831   LABPROT 17.6*  INR 1.43   ABG    Component Value Date/Time   PHART 7.333* 01/28/2015 0128   HCO3 21.4 01/28/2015 0128   TCO2 23 01/28/2015 0128   ACIDBASEDEF 4.0* 01/28/2015 0128   O2SAT 93.0 01/28/2015 0128   CBG (last 3)  No results for input(s): GLUCAP in the last 72 hours.  CXR: ok  ECG: sinus tach 52, old IMI  Assessment/Plan: S/P Procedure(s) (LRB): REDO CORONARY ARTERY BYPASS GRAFTING (CABG) x  three , using right internal mammary artery and bilateral radial arteries (N/A) BILATERAL RADIAL ARTERY HARVEST (Bilateral) TRANSESOPHAGEAL ECHOCARDIOGRAM (TEE) (N/A)  Hemodynamically stable on neo and dop 3. Wean as tolerated. Mobilize Diuresis Diabetes control d/c tubes/lines Continue foley due to patient in ICU and urinary output monitoring See progression orders   LOS: 1 day    Ricardo Jimenez 01/28/2015

## 2015-01-29 ENCOUNTER — Inpatient Hospital Stay (HOSPITAL_COMMUNITY): Payer: Medicare Other

## 2015-01-29 LAB — BASIC METABOLIC PANEL
Anion gap: 8 (ref 5–15)
BUN: 19 mg/dL (ref 6–23)
CO2: 23 mmol/L (ref 19–32)
Calcium: 7.9 mg/dL — ABNORMAL LOW (ref 8.4–10.5)
Chloride: 105 mmol/L (ref 96–112)
Creatinine, Ser: 1.19 mg/dL (ref 0.50–1.35)
GFR calc non Af Amer: 63 mL/min — ABNORMAL LOW (ref >90)
GFR, EST AFRICAN AMERICAN: 73 mL/min — AB (ref >90)
GLUCOSE: 107 mg/dL — AB (ref 70–99)
Potassium: 4.3 mmol/L (ref 3.5–5.1)
Sodium: 136 mmol/L (ref 135–145)

## 2015-01-29 LAB — GLUCOSE, CAPILLARY
GLUCOSE-CAPILLARY: 128 mg/dL — AB (ref 70–99)
GLUCOSE-CAPILLARY: 93 mg/dL (ref 70–99)
GLUCOSE-CAPILLARY: 118 mg/dL — AB (ref 70–99)
GLUCOSE-CAPILLARY: 114 mg/dL — AB (ref 70–99)
Glucose-Capillary: 112 mg/dL — ABNORMAL HIGH (ref 70–99)
Glucose-Capillary: 118 mg/dL — ABNORMAL HIGH (ref 70–99)
Glucose-Capillary: 154 mg/dL — ABNORMAL HIGH (ref 70–99)

## 2015-01-29 LAB — CBC
HEMATOCRIT: 23.7 % — AB (ref 39.0–52.0)
Hemoglobin: 7.9 g/dL — ABNORMAL LOW (ref 13.0–17.0)
MCH: 31.7 pg (ref 26.0–34.0)
MCHC: 33.3 g/dL (ref 30.0–36.0)
MCV: 95.2 fL (ref 78.0–100.0)
Platelets: 105 10*3/uL — ABNORMAL LOW (ref 150–400)
RBC: 2.49 MIL/uL — AB (ref 4.22–5.81)
RDW: 14.2 % (ref 11.5–15.5)
WBC: 8.4 10*3/uL (ref 4.0–10.5)

## 2015-01-29 MED ORDER — FUROSEMIDE 10 MG/ML IJ SOLN
40.0000 mg | Freq: Once | INTRAMUSCULAR | Status: AC
  Administered 2015-01-29: 40 mg via INTRAVENOUS

## 2015-01-29 MED ORDER — METOPROLOL TARTRATE 25 MG PO TABS
25.0000 mg | ORAL_TABLET | Freq: Two times a day (BID) | ORAL | Status: DC
  Administered 2015-01-29 – 2015-02-01 (×7): 25 mg via ORAL
  Filled 2015-01-29 (×8): qty 1

## 2015-01-29 MED ORDER — FERROUS GLUCONATE 324 (38 FE) MG PO TABS
324.0000 mg | ORAL_TABLET | Freq: Two times a day (BID) | ORAL | Status: DC
  Administered 2015-01-29 – 2015-02-01 (×7): 324 mg via ORAL
  Filled 2015-01-29 (×10): qty 1

## 2015-01-29 NOTE — Progress Notes (Signed)
CARDIAC REHAB PHASE I   PRE:  Rate/Rhythm: 74 sinus   BP:  Supine: 121/70  Sitting:   Standing:    SaO2: 97 2L  MODE:  Ambulation: 550 ft   POST:  Rate/Rhythem: 88 sinus  BP:  Supine:   Sitting: 114/71  Standing:    SaO2: 90-91 RA Pt ambulated 550 ft with assist x1.  Pt tolerated walk well without complaint.  He is eager to walk and encouraged to walk with staff over weekend.  We will f/u on Monday. Alberteen Sam, MA, ACSM RCEP [phone] Clotilde Dieter

## 2015-01-29 NOTE — Progress Notes (Signed)
2 Days Post-Op Procedure(s) (LRB): REDO CORONARY ARTERY BYPASS GRAFTING (CABG) x  three , using right internal mammary artery and bilateral radial arteries (N/A) BILATERAL RADIAL ARTERY HARVEST (Bilateral) TRANSESOPHAGEAL ECHOCARDIOGRAM (TEE) (N/A) Subjective:  sore  Objective: Vital signs in last 24 hours: Temp:  [97.8 F (36.6 C)-99.9 F (37.7 C)] 98 F (36.7 C) (04/09 6073) Pulse Rate:  [73-101] 77 (04/09 0700) Cardiac Rhythm:  [-] Normal sinus rhythm (04/09 0400) Resp:  [18-40] 19 (04/09 0700) BP: (113-172)/(41-90) 116/54 mmHg (04/09 0700) SpO2:  [95 %-100 %] 98 % (04/09 0700) Arterial Line BP: (113-155)/(51-66) 155/66 mmHg (04/08 1300) Weight:  [101.606 kg (224 lb)] 101.606 kg (224 lb) (04/09 0145)  Hemodynamic parameters for last 24 hours: PAP: (22-24)/(9) 22/9 mmHg  Intake/Output from previous day: 04/08 0701 - 04/09 0700 In: 1315.6 [P.O.:360; I.V.:855.6; IV Piggyback:100] Out: 3290 [Urine:2980; Chest Tube:310] Intake/Output this shift: Total I/O In: -  Out: 115 [Urine:75; Chest Tube:40]  General appearance: alert and cooperative Neurologic: intact Heart: regular rate and rhythm, S1, S2 normal, no murmur, click, rub or gallop Lungs: clear to auscultation bilaterally Extremities: edema moderate Wound: dressings dry  Lab Results:  Recent Labs  01/28/15 1607 01/28/15 1609 01/29/15 0513  WBC 7.7  --  8.4  HGB 9.2* 9.2* 7.9*  HCT 27.1* 27.0* 23.7*  PLT 143*  --  105*   BMET:  Recent Labs  01/28/15 0340  01/28/15 1609 01/29/15 0513  NA 139  --  138 136  K 4.5  --  4.3 4.3  CL 110  --  101 105  CO2 26  --   --  23  GLUCOSE 136*  --  144* 107*  BUN 18  --  17 19  CREATININE 1.30  < > 1.30 1.19  CALCIUM 7.6*  --   --  7.9*  < > = values in this interval not displayed.  PT/INR:  Recent Labs  01/27/15 1831  LABPROT 17.6*  INR 1.43   ABG    Component Value Date/Time   PHART 7.333* 01/28/2015 0128   HCO3 21.4 01/28/2015 0128   TCO2 20  01/28/2015 1609   ACIDBASEDEF 4.0* 01/28/2015 0128   O2SAT 93.0 01/28/2015 0128   CBG (last 3)   Recent Labs  01/29/15 0013 01/29/15 0413 01/29/15 0753  GLUCAP 154* 128* 93    Assessment/Plan: S/P Procedure(s) (LRB): REDO CORONARY ARTERY BYPASS GRAFTING (CABG) x  three , using right internal mammary artery and bilateral radial arteries (N/A) BILATERAL RADIAL ARTERY HARVEST (Bilateral) TRANSESOPHAGEAL ECHOCARDIOGRAM (TEE) (N/A) Mobilize Diuresis: weight is 8 lbs over preop. Expected acute blood loss anemia: Hgb dropped to 7.9 from 9.2 yesterday. I suspect this is due to fluid shifts. There has been no blood loss. Will start Fe2+. Diabetes control: glucose under control and preop Hgb A1c was <6. Continue SSI. Stop Levemir. Continue Imdur for radial artery grafts. Keep neck sleeve in since he has no IV access in arms. Plan for transfer to step-down: see transfer orders   LOS: 2 days    [name] 01/29/2015

## 2015-01-30 LAB — GLUCOSE, CAPILLARY
Glucose-Capillary: 99 mg/dL (ref 70–99)
Glucose-Capillary: 131 mg/dL — ABNORMAL HIGH (ref 70–99)

## 2015-01-30 MED ORDER — POTASSIUM CHLORIDE CRYS ER 20 MEQ PO TBCR
20.0000 meq | EXTENDED_RELEASE_TABLET | Freq: Every day | ORAL | Status: DC
  Administered 2015-01-30 – 2015-02-01 (×3): 20 meq via ORAL
  Filled 2015-01-30 (×3): qty 1

## 2015-01-30 MED ORDER — LISINOPRIL 5 MG PO TABS
5.0000 mg | ORAL_TABLET | Freq: Every day | ORAL | Status: DC
  Administered 2015-01-30: 5 mg via ORAL
  Filled 2015-01-30 (×2): qty 1

## 2015-01-30 MED ORDER — FUROSEMIDE 40 MG PO TABS
40.0000 mg | ORAL_TABLET | Freq: Every day | ORAL | Status: DC
  Administered 2015-01-30 – 2015-02-01 (×3): 40 mg via ORAL
  Filled 2015-01-30 (×3): qty 1

## 2015-01-30 NOTE — Progress Notes (Signed)
Pt ambulated with rolling walker 150 ft with wife. Pt tolerated well but became very hot after ambulated. Pt returned to bed and call bell within reach.   Will continue to monitor. Ricardo Jimenez

## 2015-01-30 NOTE — Discharge Instructions (Signed)
Activity: 1.May walk up steps                2.No lifting more than ten pounds for four weeks.                 3.No driving for four weeks.                4.Stop any activity that causes chest pain, shortness of breath, dizziness, sweating or excessive weakness.                5.Avoid straining.                6.Continue with your breathing exercises daily.  Diet:  Low fat, Low salt diet  Wound Care: May shower.  Clean wounds with mild soap and water daily. Contact the office at [phone] if any problems arise.  Coronary Artery Bypass Grafting, Care After Refer to this sheet in the next few weeks. These instructions provide you with information on caring for yourself after your procedure. Your health care provider may also give you more specific instructions. Your treatment has been planned according to current medical practices, but problems sometimes occur. Call your health care provider if you have any problems or questions after your procedure. WHAT TO EXPECT AFTER THE PROCEDURE Recovery from surgery will be different for everyone. Some people feel well after 3 or 4 weeks, while for others it takes longer. After your procedure, it is typical to have the following:  Nausea and a lack of appetite.   Constipation.  Weakness and fatigue.   Depression or irritability.   Pain or discomfort at your incision site. HOME CARE INSTRUCTIONS  Take medicines only as directed by your health care provider. Do not stop taking medicines or start any new medicines without first checking with your health care provider.  Take your pulse as directed by your health care provider.  Perform deep breathing as directed by your health care provider. If you were given a device called an incentive spirometer, use it to practice deep breathing several times a day. Support your chest with a pillow or your arms when you take deep breaths or cough.  Keep incision areas clean, dry, and protected. Remove or  change any bandages (dressings) only as directed by your health care provider. You may have skin adhesive strips over the incision areas. Do not take the strips off. They will fall off on their own.  Check incision areas daily for any swelling, redness, or drainage.  If incisions were made in your legs, do the following:  Avoid crossing your legs.   Avoid sitting for long periods of time. Change positions every 30 minutes.   Elevate your legs when you are sitting.  Wear compression stockings as directed by your health care provider. These stockings help keep blood clots from forming in your legs.  Take showers once your health care provider approves. Until then, only take sponge baths. Pat incisions dry. Do not rub incisions with a washcloth or towel. Do not take baths, swim, or use a hot tub until your health care provider approves.  Eat foods that are high in fiber, such as raw fruits and vegetables, whole grains, beans, and nuts. Meats should be lean cut. Avoid canned, processed, and fried foods.  Drink enough fluid to keep your urine clear or pale yellow.  Weigh yourself every day. This helps identify if you are retaining fluid that may make your heart and lungs work harder.  Rest and limit activity as directed by your health care provider. You may be instructed to:  Stop any activity at once if you have chest pain, shortness of breath, irregular heartbeats, or dizziness. Get help right away if you have any of these symptoms.  Move around frequently for short periods or take short walks as directed by your health care provider. Increase your activities gradually. You may need physical therapy or cardiac rehabilitation to help strengthen your muscles and build your endurance.  Avoid lifting, pushing, or pulling anything heavier than 10 lb (4.5 kg) for at least 6 weeks after surgery.  Do not drive until your health care provider approves.  Ask your health care provider when you  may return to work.  Ask your health care provider when you may resume sexual activity.  Keep all follow-up visits as directed by your health care provider. This is important. SEEK MEDICAL CARE IF:  You have swelling, redness, increasing pain, or drainage at the site of an incision.  You have a fever.  You have swelling in your ankles or legs.  You have pain in your legs.   You gain 2 or more pounds (0.9 kg) a day.  You are nauseous or vomit.  You have diarrhea. SEEK IMMEDIATE MEDICAL CARE IF:  You have chest pain that goes to your jaw or arms.  You have shortness of breath.   You have a fast or irregular heartbeat.   You notice a "clicking" in your breastbone (sternum) when you move.   You have numbness or weakness in your arms or legs.  You feel dizzy or light-headed.  MAKE SURE YOU:  Understand these instructions.  Will watch your condition.  Will get help right away if you are not doing well or get worse. Document Released: 04/27/2005 Document Revised: 02/22/2014 Document Reviewed: 03/17/2013 [facility] Patient Information 2015 [place], [place]. This information is not intended to replace advice given to you by your health care provider. Make sure you discuss any questions you have with your health care provider.

## 2015-01-30 NOTE — Progress Notes (Addendum)
      [address]       [place], [address]             [phone]        3 Days Post-Op Procedure(s) (LRB): REDO CORONARY ARTERY BYPASS GRAFTING (CABG) x  three , using right internal mammary artery and bilateral radial arteries (N/A) BILATERAL RADIAL ARTERY HARVEST (Bilateral) TRANSESOPHAGEAL ECHOCARDIOGRAM (TEE) (N/A)  Subjective: Patient trying to eat breakfast-not much appetite. Passing flatus.  Objective: Vital signs in last 24 hours: Temp:  [97.4 F (36.3 C)-99.5 F (37.5 C)] 99.5 F (37.5 C) (04/10 0441) Pulse Rate:  [80-93] 90 (04/10 0441) Cardiac Rhythm:  [-] Normal sinus rhythm (04/09 2100) Resp:  [18-36] 18 (04/10 0441) BP: (118-192)/(46-93) 140/69 mmHg (04/10 0441) SpO2:  [90 %-100 %] 90 % (04/10 0441) Weight:  [219 lb 11.2 oz (99.655 kg)] 219 lb 11.2 oz (99.655 kg) (04/10 0112)  Pre op weight 98 kg Current Weight  01/30/15 219 lb 11.2 oz (99.655 kg)      Intake/Output from previous day: 04/09 0701 - 04/10 0700 In: 85 [P.O.:25; I.V.:60] Out: 2725 [Urine:1675; Chest Tube:80]   Physical Exam:  Cardiovascular: RRR Pulmonary: Diminished at bases bilaterally; no rales, wheezes, or rhonchi. Abdomen: Soft, non tender, bowel sounds present. Extremities: Mild bilateral lower extremity edema. ++ bilateral hand swelling. Wounds: UE wounds ar clean and dry.  No erythema or signs of infection. Sternal dressing taken down and wound is clean and dry.  Lab Results: CBC: Recent Labs  01/28/15 1607 01/28/15 1609 01/29/15 0513  WBC 7.7  --  8.4  HGB 9.2* 9.2* 7.9*  HCT 27.1* 27.0* 23.7*  PLT 143*  --  105*   BMET:  Recent Labs  01/28/15 0340  01/28/15 1609 01/29/15 0513  NA 139  --  138 136  K 4.5  --  4.3 4.3  CL 110  --  101 105  CO2 26  --   --  23  GLUCOSE 136*  --  144* 107*  BUN 18  --  17 19  CREATININE 1.30  < > 1.30 1.19  CALCIUM 7.6*  --   --  7.9*  < > = values in this interval not displayed.  PT/INR:  Lab Results    Component Value Date   INR 1.43 01/27/2015   INR 1.05 01/25/2015   INR 1.05 09/18/2014   ABG:  INR: Will add last result for INR, ABG once components are confirmed Will add last 4 CBG results once components are confirmed  Assessment/Plan:  1. CV - SR in the 80's. On Lopressor 25 mg bid, Imdur 30 mg daily. Will start low dose ACE for better BP control. 2.  Pulmonary - On room air. Encourage incentive spirometer 3. Volume Overload - Will put on Lasix 40 mg daily 4.  Acute blood loss anemia - H and H yesterday was 7.9 and 23.7. Continue oral Ferrous 5. Thrombocytopenia-platelets yesterday 105,000 6. Remove EPW in am 7. Continue with CRPI 8. CBGs 112/118/99. Pre op HGA1C 5.6. Will stop accu checks and SS PRN. 9. Sleeve has been left as is only IV access  ZIMMERMAN,DONIELLE MPA-C 01/30/2015,7:38 AM   Chart reviewed, patient examined, agree with above. Repeat labs in am to follow up on Hgb.

## 2015-01-31 LAB — BASIC METABOLIC PANEL
ANION GAP: 6 (ref 5–15)
BUN: 27 mg/dL — AB (ref 6–23)
CHLORIDE: 105 mmol/L (ref 96–112)
CO2: 26 mmol/L (ref 19–32)
Calcium: 7.9 mg/dL — ABNORMAL LOW (ref 8.4–10.5)
Creatinine, Ser: 1.06 mg/dL (ref 0.50–1.35)
GFR, EST AFRICAN AMERICAN: 84 mL/min — AB (ref >90)
GFR, EST NON AFRICAN AMERICAN: 72 mL/min — AB (ref >90)
Glucose, Bld: 95 mg/dL (ref 70–99)
POTASSIUM: 3.6 mmol/L (ref 3.5–5.1)
Sodium: 137 mmol/L (ref 135–145)

## 2015-01-31 LAB — CBC
HEMATOCRIT: 22.3 % — AB (ref 39.0–52.0)
Hemoglobin: 7.3 g/dL — ABNORMAL LOW (ref 13.0–17.0)
MCH: 31.2 pg (ref 26.0–34.0)
MCHC: 32.7 g/dL (ref 30.0–36.0)
MCV: 95.3 fL (ref 78.0–100.0)
Platelets: 170 10*3/uL (ref 150–400)
RBC: 2.34 MIL/uL — AB (ref 4.22–5.81)
RDW: 14.8 % (ref 11.5–15.5)
WBC: 6 10*3/uL (ref 4.0–10.5)

## 2015-01-31 LAB — PREPARE RBC (CROSSMATCH)

## 2015-01-31 MED ORDER — SODIUM CHLORIDE 0.9 % IJ SOLN
10.0000 mL | INTRAMUSCULAR | Status: DC | PRN
  Administered 2015-01-31: 10 mL

## 2015-01-31 MED ORDER — SODIUM CHLORIDE 0.9 % IJ SOLN: 10.0000 mL | Freq: Two times a day (BID) | INTRAMUSCULAR | Status: DC

## 2015-01-31 MED ORDER — POLYETHYLENE GLYCOL 3350 17 G PO PACK
17.0000 g | PACK | Freq: Every day | ORAL | Status: DC
  Administered 2015-01-31: 17 g via ORAL
  Filled 2015-01-31 (×2): qty 1

## 2015-01-31 MED ORDER — FUROSEMIDE 10 MG/ML IJ SOLN
20.0000 mg | Freq: Once | INTRAMUSCULAR | Status: AC
  Administered 2015-01-31: 20 mg via INTRAVENOUS
  Filled 2015-01-31: qty 2

## 2015-01-31 MED ORDER — LACTULOSE 10 GM/15ML PO SOLN
20.0000 g | Freq: Every day | ORAL | Status: DC | PRN
  Filled 2015-01-31: qty 30

## 2015-01-31 MED ORDER — LISINOPRIL 10 MG PO TABS
10.0000 mg | ORAL_TABLET | Freq: Every day | ORAL | Status: DC
  Administered 2015-01-31 – 2015-02-01 (×2): 10 mg via ORAL
  Filled 2015-01-31 (×2): qty 1

## 2015-01-31 MED ORDER — SODIUM CHLORIDE 0.9 % IV SOLN
Freq: Once | INTRAVENOUS | Status: AC
  Administered 2015-01-31: 12:00:00 via INTRAVENOUS

## 2015-01-31 MED FILL — Heparin Sodium (Porcine) Inj 1000 Unit/ML: INTRAMUSCULAR | Qty: 30 | Status: AC

## 2015-01-31 MED FILL — Dexmedetomidine HCl in NaCl 0.9% IV Soln 400 MCG/100ML: INTRAVENOUS | Qty: 100 | Status: AC

## 2015-01-31 MED FILL — Magnesium Sulfate Inj 50%: INTRAMUSCULAR | Qty: 10 | Status: AC

## 2015-01-31 MED FILL — Potassium Chloride Inj 2 mEq/ML: INTRAVENOUS | Qty: 40 | Status: AC

## 2015-01-31 NOTE — Progress Notes (Signed)
Utilization review completed.  

## 2015-01-31 NOTE — Progress Notes (Addendum)
Nursing note Patient EPW pulled per protocol and as ordered.pt heart rat 77 on monitor All ends intact pt reminded to lie supine approximately one hour. Yecenia Dalgleish, Bettina Gavia RN

## 2015-01-31 NOTE — Progress Notes (Signed)
Medicare Important Message given? YES  (If response is "NO", the following Medicare IM given date fields will be blank)  Date Medicare IM given: 01/31/15 Medicare IM given by:  Crystalee Ventress  

## 2015-01-31 NOTE — Progress Notes (Addendum)
      [address]       [place] [address]             [phone]      4 Days Post-Op Procedure(s) (LRB): REDO CORONARY ARTERY BYPASS GRAFTING (CABG) x  three , using right internal mammary artery and bilateral radial arteries (N/A) BILATERAL RADIAL ARTERY HARVEST (Bilateral) TRANSESOPHAGEAL ECHOCARDIOGRAM (TEE) (N/A)   Subjective:  Ricardo Jimenez continues to complain of bilateral hand swelling.  His wife is also concerned that the patient is depressed.  He is ambulating.  No BM, + flatus  Objective: Vital signs in last 24 hours: Temp:  [97.8 F (36.6 C)-98.4 F (36.9 C)] 97.8 F (36.6 C) (04/11 0616) Pulse Rate:  [79-87] 82 (04/11 0616) Cardiac Rhythm:  [-] Normal sinus rhythm (04/10 1941) Resp:  [18] 18 (04/11 0616) BP: (114-156)/(53-54) 156/54 mmHg (04/11 0616) SpO2:  [91 %-94 %] 93 % (04/11 0616) Weight:  [214 lb 8.1 oz (97.3 kg)] 214 lb 8.1 oz (97.3 kg) (04/11 0430)  Intake/Output from previous day: 04/10 0701 - 04/11 0700 In: -  Out: 1125 [Urine:1125]  General appearance: alert, cooperative and no distress Heart: regular rate and rhythm Lungs: clear to auscultation bilaterally Abdomen: soft, non-tender; bowel sounds normal; no masses,  no organomegaly Extremities: edema No LE edema appreciated, bilateral edema in hands Wound: clean and dry, ecchymosis noted on both UE from RA harvest  Lab Results:  Recent Labs  01/29/15 0513 01/31/15 0659  WBC 8.4 6.0  HGB 7.9* 7.3*  HCT 23.7* 22.3*  PLT 105* 170   BMET:  Recent Labs  01/29/15 0513 01/31/15 0659  NA 136 137  K 4.3 3.6  CL 105 105  CO2 23 26  GLUCOSE 107* 95  BUN 19 27*  CREATININE 1.19 1.06  CALCIUM 7.9* 7.9*    PT/INR: No results for input(s): LABPROT, INR in the last 72 hours. ABG    Component Value Date/Time   PHART 7.333* 01/28/2015 0128   HCO3 21.4 01/28/2015 0128   TCO2 20 01/28/2015 1609   ACIDBASEDEF 4.0* 01/28/2015 0128   O2SAT 93.0 01/28/2015 0128   CBG (last  3)   Recent Labs  01/29/15 2327 01/30/15 0444 01/30/15 0801  GLUCAP 118* 99 131*    Assessment/Plan: S/P Procedure(s) (LRB): REDO CORONARY ARTERY BYPASS GRAFTING (CABG) x  three , using right internal mammary artery and bilateral radial arteries (N/A) BILATERAL RADIAL ARTERY HARVEST (Bilateral) TRANSESOPHAGEAL ECHOCARDIOGRAM (TEE) (N/A)  1. CV- NSR, remains hypertensive- continue Lopressor, increase Lisinopril, Imdur for radial grafts 2. Pulm- no acute issues, off oxygen continue IS 3. Renal- creatinine WNL, weight is at baseline, +edema in UE-continue Lasix for now 4. Expected Acute Blood Loss Anemia- Hgb at 7.3, will transfuse 1 unit of packed cells 5. LOC Constipation- per patient uses Laxative daily at home, will order Miralax daily and Lactulose prn 6. Dispo- patient making slow progress, will transfuse today, d/c EPW, ambulate   LOS: 4 days    Ricardo Jimenez 01/31/2015   Chart reviewed, patient examined, agree with above. His Hgb has trended down with no visible blood loss. He was transfused today and looks better. His is making progress and is close to going home. Will recheck CBC in am. Should be able to remove sleeve in am.

## 2015-01-31 NOTE — Progress Notes (Signed)
Patient ambulated 350 feet with walker and wife. Back in bed call bell within reach will monitor patient. Alivya Wegman, Bettina Gavia RN

## 2015-01-31 NOTE — Progress Notes (Signed)
Pt ambulated in hallway with wife, back in room will monitor patient. Oswaldo Cueto, Bettina Gavia RN

## 2015-01-31 NOTE — Progress Notes (Signed)
CARDIAC REHAB PHASE I   PRE:  Rate/Rhythm: 74 SR  BP:  Supine:   Sitting: 144/56  Standing:    SaO2: 95 RA  MODE:  Ambulation: 350 ft   POST:  Rate/Rhythm: 77  BP:  Supine:  Sitting: 168/78  Standing:    SaO2: 92 RA [id number] Assisted X 1 and used walker to ambulate. Gait steady with walker. Pt able to walk 350 feet without c/o. Pt to bed after walk with call light in reach, to have pace maker wires discontinued. Discussed with pt's wife Outpt. CRP, he will not be able to attend due to co-pays. She is interested in nutritional consult as Outpt.  [name] RN 01/31/2015 3:10 PM

## 2015-01-31 NOTE — Progress Notes (Signed)
Pt's wife expressed concerns about husband not being himself. Requested to speak with the doctor in the am. The nurse educated pt and pt's wife on emotional stressors post open heart surgeries. Pt observed to be withdrawn and resistant to any visitors except for wife. Pt using short sentences and not engaging in conversations.   Pt call bell within reach and resting comfortably.   Will continue to monitor.   Ricardo Jimenez

## 2015-02-01 LAB — HEMOGLOBIN AND HEMATOCRIT, BLOOD
HCT: 26.9 % — ABNORMAL LOW (ref 39.0–52.0)
HEMOGLOBIN: 9 g/dL — AB (ref 13.0–17.0)

## 2015-02-01 LAB — TYPE AND SCREEN
ABO/RH(D): O NEG
ANTIBODY SCREEN: NEGATIVE
UNIT DIVISION: 0
Unit division: 0

## 2015-02-01 MED ORDER — LISINOPRIL 10 MG PO TABS: 10.0000 mg | ORAL_TABLET | Freq: Every day | ORAL | Status: DC

## 2015-02-01 MED ORDER — ISOSORBIDE MONONITRATE ER 30 MG PO TB24: 30.0000 mg | ORAL_TABLET | Freq: Every day | ORAL | Status: DC

## 2015-02-01 MED ORDER — FERROUS GLUCONATE 324 (38 FE) MG PO TABS: 324.0000 mg | ORAL_TABLET | Freq: Two times a day (BID) | ORAL | Status: DC

## 2015-02-01 MED ORDER — OXYCODONE HCL 5 MG PO TABS: 5.0000 mg | ORAL_TABLET | ORAL | Status: DC | PRN

## 2015-02-01 MED ORDER — POLYETHYLENE GLYCOL 3350 17 G PO PACK: 17.0000 g | PACK | Freq: Every day | ORAL | Status: DC

## 2015-02-01 MED ORDER — ASPIRIN 325 MG PO TBEC: 325.0000 mg | DELAYED_RELEASE_TABLET | Freq: Every day | ORAL | Status: DC

## 2015-02-01 MED ORDER — FUROSEMIDE 40 MG PO TABS: 40.0000 mg | ORAL_TABLET | Freq: Every day | ORAL | Status: DC

## 2015-02-01 NOTE — Progress Notes (Signed)
Pt ambulated with wife 350 feet with the rolling walker. Pt tolerated well and is resting comfortably in bed with call bell in reach.   Will continue to monitor.   Earlie Lou

## 2015-02-01 NOTE — Progress Notes (Signed)
[id number] Cardiac Rehab Completed discharge education with pt and wife. They voice understanding. Pt agrees to [facility]. CRP in [place], will send referral. Nurse is placing on surgery discharge video on for them to watch. Deon Pilling, RN 02/01/2015 9:05 AM

## 2015-02-01 NOTE — Progress Notes (Signed)
Pt ambulated 350 ft with wife. VSS and pt resting comfortably, call bell within reach.   Will continue to monitor.   Earlie Lou

## 2015-02-01 NOTE — Progress Notes (Signed)
02/01/2015 11:37 AM Right IJ Central Line DC'D per protocol.  Pressure help for 5 minutes, vaseline gauze applied.  Catheter tip intact.  Chest tube sutures x4 D/C'D.  Steri strips applied.  Instructed pt. Bedrest x46minutes.  Pt. Voiced understanding. Carney Corners

## 2015-02-01 NOTE — Care Management Note (Signed)
    Page 1 of 1   02/01/2015     3:07:50 PM CARE MANAGEMENT NOTE 02/01/2015  Patient:  Wilcher,Josian B   Account Number:  [id number]  Date Initiated:  01/28/2015  Documentation initiated by:  Luz Lex  Subjective/Objective Assessment:   Post op CABG x3     Action/Plan:   Anticipated DC Date:  02/01/2015   Anticipated DC Plan:  [place]  CM consult      Choice offered to / List presented to:             Status of service:  Completed, signed off Medicare Important Message given?  YES (If response is "NO", the following Medicare IM given date fields will be blank) Date Medicare IM given:  01/31/2015 Medicare IM given by:  Marvetta Gibbons Date Additional Medicare IM given:   Additional Medicare IM given by:    Discharge Disposition:  HOME/SELF CARE  Per UR Regulation:  Reviewed for med. necessity/level of care/duration of stay  If discussed at [facility] of Stay Meetings, dates discussed:   02/01/2015    Comments:  Contact:  Langhorst,Elizabeth Spouse [phone]  [phone]

## 2015-02-01 NOTE — Progress Notes (Addendum)
      [address]       [place], [address]             [phone]      5 Days Post-Op Procedure(s) (LRB): REDO CORONARY ARTERY BYPASS GRAFTING (CABG) x  three , using right internal mammary artery and bilateral radial arteries (N/A) BILATERAL RADIAL ARTERY HARVEST (Bilateral) TRANSESOPHAGEAL ECHOCARDIOGRAM (TEE) (N/A)   Subjective:  Mr. Ricardo Jimenez is feeling much better.  He ambulated several times yesterday.  He is ready to be discharged home today.   Objective: Vital signs in last 24 hours: Temp:  [97.4 F (36.3 C)-98.7 F (37.1 C)] 97.4 F (36.3 C) (04/12 0439) Pulse Rate:  [70-85] 74 (04/12 0439) Cardiac Rhythm:  [-] Normal sinus rhythm;Bundle branch block (04/11 1942) Resp:  [18] 18 (04/12 0439) BP: (113-162)/(54-96) 146/60 mmHg (04/12 0439) SpO2:  [93 %-100 %] 98 % (04/12 0439) Weight:  [212 lb 8.4 oz (96.4 kg)] 212 lb 8.4 oz (96.4 kg) (04/12 0439)  04/11 0701 - 04/12 0700 In: 50 [I.V.:20; Blood:30] Out: 1500 [Urine:1500]  General appearance: alert, cooperative and no distress Heart: regular rate and rhythm Lungs: clear to auscultation bilaterally Abdomen: soft, non-tender; bowel sounds normal; no masses,  no organomegaly Extremities: edema none appreciated in LE, bilateral hand swelling, + ecchymosis present Wound: clean and dry  Lab Results:  Recent Labs  01/31/15 0659 02/01/15 0500  WBC 6.0  --   HGB 7.3* 9.0*  HCT 22.3* 26.9*  PLT 170  --    BMET:  Recent Labs  01/31/15 0659  NA 137  K 3.6  CL 105  CO2 26  GLUCOSE 95  BUN 27*  CREATININE 1.06  CALCIUM 7.9*    PT/INR: No results for input(s): LABPROT, INR in the last 72 hours. ABG    Component Value Date/Time   PHART 7.333* 01/28/2015 0128   HCO3 21.4 01/28/2015 0128   TCO2 20 01/28/2015 1609   ACIDBASEDEF 4.0* 01/28/2015 0128   O2SAT 93.0 01/28/2015 0128   CBG (last 3)   Recent Labs  01/29/15 2327 01/30/15 0444 01/30/15 0801  GLUCAP 118* 99 131*     Assessment/Plan: S/P Procedure(s) (LRB): REDO CORONARY ARTERY BYPASS GRAFTING (CABG) x  three , using right internal mammary artery and bilateral radial arteries (N/A) BILATERAL RADIAL ARTERY HARVEST (Bilateral) TRANSESOPHAGEAL ECHOCARDIOGRAM (TEE) (N/A)  1. CV- NSR, hypertension improved- continue Lopressor, Lisinopril, Imdur for radial grafts 2. Pulm- no acute issues, encouraged routine use of IS at discharge 3. Renal- weight is at baseline, hands remain swollen- likely due to radial artery harvest- will taper lasix  4. Expected Acute Blood Loss Anemia- Hgb 9.0 after transfusion, continue Iron 5. GI- constipation, continue Miralax, Lactulose 6. Dispo-patient doing well, would like to go home today and I think he is medically stable for discharge today   LOS: 5 days    Ellwood Handler 02/01/2015   Chart reviewed, patient examined, agree with above. He looks good. Plan home today.

## 2015-02-01 NOTE — Anesthesia Postprocedure Evaluation (Signed)
  Anesthesia Post-op Note  Patient: Ricardo Jimenez  Procedure(s) Performed: Procedure(s) with comments: REDO CORONARY ARTERY BYPASS GRAFTING (CABG) x  three , using right internal mammary artery and bilateral radial arteries (N/A) BILATERAL RADIAL ARTERY HARVEST (Bilateral) - BILATERAL RADIAL HARVEST & RIGHT IMA TRANSESOPHAGEAL ECHOCARDIOGRAM (TEE) (N/A)  Patient Location: PACU and SICU  Anesthesia Type:General  Level of Consciousness: Patient remains intubated per anesthesia plan  Airway and Oxygen Therapy: Patient remains intubated per anesthesia plan  Post-op Pain: mild  Post-op Assessment: Post-op Vital signs reviewed  Post-op Vital Signs: Reviewed  Last Vitals:  Filed Vitals:   02/01/15 0439  BP: 146/60  Pulse: 74  Temp: 36.3 C  Resp: 18    Complications: No apparent anesthesia complications

## 2015-02-01 NOTE — Progress Notes (Signed)
02/01/2015 2:26 PM Discharge AVS meds taken today and those due this evening reviewed.  Follow-up appointments and when to call md reviewed.  D/C IV and TELE.  Questions and concerns addressed concerning post cardiac surgery.   D/C home per orders. Carney Corners

## 2015-02-07 ENCOUNTER — Other Ambulatory Visit: Payer: Self-pay | Admitting: *Deleted

## 2015-02-07 MED ORDER — ATORVASTATIN CALCIUM 80 MG PO TABS: 80.0000 mg | ORAL_TABLET | Freq: Every day | ORAL | Status: DC

## 2015-02-07 NOTE — Telephone Encounter (Signed)
Atorvastatin refilled today

## 2015-02-16 ENCOUNTER — Ambulatory Visit (INDEPENDENT_AMBULATORY_CARE_PROVIDER_SITE_OTHER): Payer: Medicare Other | Admitting: Cardiovascular Disease

## 2015-02-16 ENCOUNTER — Encounter: Payer: Self-pay | Admitting: Cardiovascular Disease

## 2015-02-16 VITALS — BP 156/100 | HR 85 | Ht 69.0 in | Wt 210.0 lb

## 2015-02-16 DIAGNOSIS — I25812 Atherosclerosis of bypass graft of coronary artery of transplanted heart without angina pectoris: Secondary | ICD-10-CM

## 2015-02-16 DIAGNOSIS — Z87898 Personal history of other specified conditions: Secondary | ICD-10-CM

## 2015-02-16 DIAGNOSIS — I10 Essential (primary) hypertension: Secondary | ICD-10-CM

## 2015-02-16 DIAGNOSIS — E785 Hyperlipidemia, unspecified: Secondary | ICD-10-CM

## 2015-02-16 DIAGNOSIS — Z9289 Personal history of other medical treatment: Secondary | ICD-10-CM

## 2015-02-16 DIAGNOSIS — Z951 Presence of aortocoronary bypass graft: Secondary | ICD-10-CM | POA: Diagnosis not present

## 2015-02-16 NOTE — Progress Notes (Signed)
Patient ID: Ricardo Jimenez, male   DOB: [birth year], 64 y.o.   MRN: [id number]      SUBJECTIVE: The patient returns for posthospitalization follow-up after undergoing redo coronary artery bypass grafting 3, with a free right internal mammary graft to the diagonal, right radial artery graft to the OM 3, and left radial artery graft to the OM1. He previously underwent 4 vessel CABG in 06/2009.  He is feeling well and is without complaints. He currently denies chest pain and shortness of breath and is planning on participating in cardiac rehabilitation in [place], [place].  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 Occasional PVCs were noted.  Review of Systems: As per "subjective", otherwise negative.  No Known Allergies  Current Outpatient Prescriptions  Medication Sig Dispense Refill  . acetaminophen (TYLENOL) 650 MG CR tablet Take 1,300 mg by mouth every morning.     [name] aspirin EC 325 MG EC tablet Take 1 tablet (325 mg total) by mouth daily. 30 tablet 0  . atorvastatin (LIPITOR) 80 MG tablet Take 1 tablet (80 mg total) by mouth daily at 6 PM. 30 tablet 6  . diphenhydramine-acetaminophen (TYLENOL PM EXTRA STRENGTH) 25-500 MG TABS Take 1 tablet by mouth at bedtime as needed. For sleep    . fenofibrate 54 MG tablet Take 1 tablet (54 mg total) by mouth daily. 30 tablet 5  . ferrous gluconate (FERGON) 324 MG tablet Take 1 tablet (324 mg total) by mouth 2 (two) times daily with a meal. 60 tablet 3  . gabapentin (NEURONTIN) 300 MG capsule Take 300-600 mg by mouth 2 (two) times daily. Take 300 mg in the morning and 600 mg at bedtime     . glucosamine-chondroitin 500-400 MG tablet Take 1 tablet by mouth 2 (two) times daily.     . isosorbide mononitrate (IMDUR) 30 MG 24 hr tablet Take 1 tablet (30 mg total) by mouth daily. 60 tablet 6  . lisinopril (PRINIVIL,ZESTRIL) 10 MG tablet Take 1 tablet (10 mg total)  by mouth daily. 30 tablet 3  . metoprolol tartrate (LOPRESSOR) 25 MG tablet Take 25 mg by mouth 2 (two) times daily.     . Multiple Vitamin (MULTIVITAMIN) tablet Take 1 tablet by mouth daily.      . nitroGLYCERIN (NITROSTAT) 0.4 MG SL tablet Place 1 tablet (0.4 mg total) under the tongue every 5 (five) minutes as needed for chest pain. 25 tablet 4  . polyethylene glycol (MIRALAX / GLYCOLAX) packet Take 17 g by mouth daily. 14 each 0  . potassium citrate (UROCIT-K) 5 MEQ (540 MG) SR tablet Take 5-10 mEq by mouth 2 (two) times daily. Take 2 tablets in the morning and 1 at night    . ranitidine (ZANTAC) 150 MG tablet take 1 tablet by mouth twice a day 60 tablet 4  . traMADol (ULTRAM) 50 MG tablet Take 50 mg by mouth 2 (two) times daily.     No current facility-administered medications for this visit.    Past Medical History  Diagnosis Date  . CAD (coronary artery disease)     a. 06/2009 Cath: 3VD, nondominant RCA;  b. 06/2009 CABG x 4: LIMA->LAD, VG->OM1, VG->OM3, VG->D1;  c. 03/2011 ETT: normal;  d. 12/2013 NSTEMI/Cath/PCI: LM nl, LAD 100p, LCX 100p, RCA nl, LIMA->LAD ok, VG->Diag 43m, VG->OM1 99d(3.5x18 Xience DES), VG->OM3 100d, EF 55%.  [name] Hypercholesterolemia   . PUD (peptic ulcer disease)   .  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 GERD (gastroesophageal reflux disease)   . Osteoarthritis   . Horseshoe kidney   . HTN (hypertension)     pt denies this  . Heart murmur     as a child    Past Surgical History  Procedure Laterality Date  . Other surgical history      BIL.CARPAL TUNNEL SURG.  . Rt.knee replacement    . Cardiac catheterization  CABG  . Kidney stone surgery Left   . Carpal tunnel release      bil  . Knee arthroscopy      rt x2, lft x3  . Coronary artery bypass graft  2010  . Hernia repair  2011    rt ing  . Total knee arthroplasty  02/11/2012    Procedure: TOTAL KNEE ARTHROPLASTY;  Surgeon: Rudean Haskell, MD;  Location: [facility];  Service: Orthopedics;  Laterality: Left;  left  total knee replacement  . Left heart catheterization with coronary/graft angiogram N/A 12/28/2013    Procedure: LEFT HEART CATHETERIZATION WITH Beatrix Fetters;  Surgeon: Larey Dresser, MD;  Location: [facility] CATH LAB;  Service: Cardiovascular;  Laterality: N/A;  . Left heart catheterization with coronary/graft angiogram N/A 09/19/2014    Procedure: LEFT HEART CATHETERIZATION WITH Beatrix Fetters;  Surgeon: Wellington Hampshire, MD;  Location: [facility] CATH LAB;  Service: Cardiovascular;  Laterality: N/A;  . Colonoscopy      x 2  . Vasectomy    . Coronary artery bypass graft N/A 01/27/2015    Procedure: REDO CORONARY ARTERY BYPASS GRAFTING (CABG) x  three , using right internal mammary artery and bilateral radial arteries;  Surgeon: [name], MD;  Location: [facility];  Service: Open Heart Surgery;  Laterality: N/A;  . Radial artery harvest Bilateral 01/27/2015    Procedure: BILATERAL RADIAL ARTERY HARVEST;  Surgeon: [name], MD;  Location: [facility];  Service: Open Heart Surgery;  Laterality: Bilateral;  BILATERAL RADIAL HARVEST & RIGHT IMA  . Tee without cardioversion N/A 01/27/2015    Procedure: TRANSESOPHAGEAL ECHOCARDIOGRAM (TEE);  Surgeon: [name], MD;  Location: [facility];  Service: Open Heart Surgery;  Laterality: N/A;    History   Social History  . Marital Status: Married    Spouse Name: N/A  . Number of Children: N/A  . Years of Education: N/A   Occupational History  . Not on file.   Social History Main Topics  . Smoking status: Never Smoker   . Smokeless tobacco: Never Used  . Alcohol Use: 0.0 oz/week    0 Standard drinks or equivalent per week     Comment: 07/22/14 Beer every now and then- AJ  . Drug Use: No  . Sexual Activity:    Partners: Female   Other Topics Concern  . Not on file   Social History Narrative   Married and lives in [place], [place] with his wife.  2 children.  Works as Photographer.     Filed Vitals:   02/16/15 1014  BP: 156/100  Pulse:  85  Height: 5\' 9"  (1.753 m)  Weight: 210 lb (95.255 kg)    PHYSICAL EXAM General: NAD HEENT: Normal. Neck: No JVD, no thyromegaly. Lungs: Clear to auscultation bilaterally with normal respiratory effort. CV: Nondisplaced PMI.  Well healed midline sternotomy incision. Regular rate and rhythm, normal S1/S2, no H8/I5, soft 1/6 systolic murmur over RUSB. No pretibial or periankle edema.  No carotid bruit.   Abdomen: Soft, nontender,  no distention.  Neurologic: Alert and oriented x 3.  Psych: Normal affect. Skin: Normal. Musculoskeletal: Normal range of motion, no gross deformities. Extremities: No clubbing or cyanosis.   ECG: Most recent ECG reviewed.      ASSESSMENT AND PLAN: 1. CAD with 3-vessel redo CABG in 01/2015: Symptomatically stable. Continue aspirin, Lipitor 80 mg, metoprolol, and Imdur.  2. Essential HTN: Elevated but taken in legs. Will monitor for now.  3. Hyperlipidemia: Lipids on 09/19/14 showed TG 280, HDL 36, LDL 101. Continue high intensity statin therapy along with fenofibrate. Will repeat lipids to see if additional therapy is warranted.  Dispo: f/u 6 months.  Kate Sable, M.D., F.A.C.C.

## 2015-02-16 NOTE — Patient Instructions (Signed)
Your physician wants you to follow-up in: 6 months with Dr. Virgina Jock will receive a reminder letter in the mail two months in advance. If you don't receive a letter, please call our office to schedule the follow-up appointment.  Your physician recommends that you continue on your current medications as directed. Please refer to the Current Medication list given to you today.  Your physician recommends that you return for lab work LIPIDS PLEASE FAST [place]  Thank you for choosing [facility]!!

## 2015-02-28 ENCOUNTER — Other Ambulatory Visit: Payer: Self-pay | Admitting: Surgery

## 2015-02-28 DIAGNOSIS — Z951 Presence of aortocoronary bypass graft: Secondary | ICD-10-CM

## 2015-03-02 ENCOUNTER — Encounter: Payer: Self-pay | Admitting: Surgery

## 2015-03-02 ENCOUNTER — Ambulatory Visit (INDEPENDENT_AMBULATORY_CARE_PROVIDER_SITE_OTHER): Payer: Self-pay | Admitting: Surgery

## 2015-03-02 ENCOUNTER — Ambulatory Visit
Admission: RE | Admit: 2015-03-02 | Discharge: 2015-03-02 | Disposition: A | Discharge status: Home / Self Care | Payer: Medicare Other | Source: Ambulatory Visit | Attending: Surgery | Admitting: Surgery

## 2015-03-02 VITALS — BP 103/66 | HR 66 | Resp 20 | Ht 69.0 in | Wt 210.0 lb

## 2015-03-02 DIAGNOSIS — Z951 Presence of aortocoronary bypass graft: Secondary | ICD-10-CM

## 2015-03-02 NOTE — Progress Notes (Signed)
HPI:  Patient returns for routine postoperative follow-up having undergone redo CABG x 3 using bilateral radial artery grafts and a RIMA graft on 01/27/2015. The patient's early postoperative recovery while in the hospital was notable for an uncomplicated postop course. Since hospital discharge the patient reports that he has been feeling well. He has been walking and doing normal activity with no chest pain or shortness of breath. He does not some residual numbness and weakness in the left 4th and 5th fingers.   Current Outpatient Prescriptions  Medication Sig Dispense Refill  . acetaminophen (TYLENOL) 650 MG CR tablet Take 1,300 mg by mouth every morning.     [name] aspirin EC 325 MG EC tablet Take 1 tablet (325 mg total) by mouth daily. 30 tablet 0  . atorvastatin (LIPITOR) 80 MG tablet Take 1 tablet (80 mg total) by mouth daily at 6 PM. 30 tablet 6  . diphenhydramine-acetaminophen (TYLENOL PM EXTRA STRENGTH) 25-500 MG TABS Take 1 tablet by mouth at bedtime as needed. For sleep    . doxycycline (VIBRAMYCIN) 100 MG capsule Take 100 mg by mouth 2 (two) times daily.   0  . fenofibrate 54 MG tablet Take 1 tablet (54 mg total) by mouth daily. 30 tablet 5  . ferrous gluconate (FERGON) 324 MG tablet Take 1 tablet (324 mg total) by mouth 2 (two) times daily with a meal. 60 tablet 3  . gabapentin (NEURONTIN) 300 MG capsule Take 300-600 mg by mouth 2 (two) times daily. Take 300 mg in the morning and 600 mg at bedtime     . glucosamine-chondroitin 500-400 MG tablet Take 1 tablet by mouth 2 (two) times daily.     . isosorbide mononitrate (IMDUR) 30 MG 24 hr tablet Take 1 tablet (30 mg total) by mouth daily. 60 tablet 6  . lisinopril (PRINIVIL,ZESTRIL) 10 MG tablet Take 1 tablet (10 mg total) by mouth daily. 30 tablet 3  . metoprolol tartrate (LOPRESSOR) 25 MG tablet Take 25 mg by mouth 2 (two) times daily.     . Multiple Vitamin (MULTIVITAMIN) tablet Take 1 tablet by mouth daily.      .  nitroGLYCERIN (NITROSTAT) 0.4 MG SL tablet Place 1 tablet (0.4 mg total) under the tongue every 5 (five) minutes as needed for chest pain. 25 tablet 4  . polyethylene glycol (MIRALAX / GLYCOLAX) packet Take 17 g by mouth daily. 14 each 0  . potassium citrate (UROCIT-K) 5 MEQ (540 MG) SR tablet Take 5-10 mEq by mouth 2 (two) times daily. Take 2 tablets in the morning and 1 at night    . ranitidine (ZANTAC) 150 MG tablet take 1 tablet by mouth twice a day 60 tablet 4  . traMADol (ULTRAM) 50 MG tablet Take 50 mg by mouth 2 (two) times daily.     No current facility-administered medications for this visit.    Physical Exam: BP 103/66 mmHg  Pulse 66  Resp 20  Ht 5\' 9"  (1.753 m)  Wt 210 lb (95.255 kg)  BMI 31.00 kg/m2  SpO2 98% He looks well Lungs are clear Cardiac exam shows a regular rate and rhythm with normal heart sounds. The chest incision is healing well and the sternum is stable. The arm incisions are healing well.   Diagnostic Tests:  CLINICAL DATA: Left-sided chest pain  EXAM: CHEST 2 VIEW  COMPARISON: 01/29/2015  FINDINGS: Cardiac shadow is within normal limits. Postoperative changes are seen. The lungs are well aerated bilaterally without focal infiltrate  or sizable effusion. No acute bony abnormality is seen.  IMPRESSION: No acute abnormality noted.   Electronically Signed  By: Inez Catalina M.D.  On: 03/02/2015 08:34   Impression:  Overall I think he is doing well. I encouraged him to continue walking. He is planning to participate in cardiac rehab. I told him he could drive his car but should not lift anything heavier than 10 lbs for three months postop. He has some numbness and weakness in the ulnar distribution of the left hand that is due to stretching the brachial plexus with chest retraction. This is improving but I told him that it can take up to a year to completely resolve. He just needs to continue using his hand normally.   Plan:  He  will continue to follow up with Dr. Bronson Ing and Dr. Quillian Quince and will contact me if he has any problems with his incisions.   [name], MD [facility] [phone]

## 2015-03-03 ENCOUNTER — Encounter: Payer: Self-pay | Admitting: *Deleted

## 2015-03-04 ENCOUNTER — Telehealth: Payer: Self-pay | Admitting: *Deleted

## 2015-03-04 NOTE — Telephone Encounter (Signed)
Pt made aware, forwarded to pcp 

## 2015-03-04 NOTE — Telephone Encounter (Signed)
-----   Message from Herminio Commons, MD sent at 03/04/2015  9:41 AM EDT ----- LDL more optimally controlled. Needs better TG control (mildly elevated). Consider dietary and exercise modification.

## 2015-04-07 ENCOUNTER — Telehealth: Payer: Self-pay | Admitting: *Deleted

## 2015-04-07 MED ORDER — ISOSORBIDE MONONITRATE ER 30 MG PO TB24: 15.0000 mg | ORAL_TABLET | Freq: Every day | ORAL | Status: DC

## 2015-04-07 NOTE — Telephone Encounter (Signed)
Patient informed and verbalized understanding of plan. Information sent to [facility] department for their record.

## 2015-04-07 NOTE — Telephone Encounter (Signed)
-----   Message from Lendon Colonel, NP sent at 04/07/2015  3:07 PM EDT ----- Regarding: RE: Please call me about this patient when you have [phone] Decrease Isosorbide to 15 mg daily. If BP is still low will need to decrease lisinopril next.  ----- Message -----    From: Merlene Laughter, LPN    Sent: 6/33/3545   2:00 PM      To: Lendon Colonel, NP Subject: Please call me about this patient when you h#  Received a fax from Cardiac Rehab about his BP being 84/57 HR 65 and being asymptomatic. Just wanted to know if we should cut back on any of his cardiac medications  thanks

## 2015-04-11 ENCOUNTER — Telehealth: Payer: Self-pay | Admitting: Cardiovascular Disease

## 2015-04-11 MED ORDER — FENOFIBRATE 54 MG PO TABS
54.0000 mg | ORAL_TABLET | Freq: Every day | ORAL | Status: DC
Start: 2015-04-11 — End: 2015-11-07

## 2015-04-11 NOTE — Telephone Encounter (Signed)
Patient informed that refill request was being sent to initial prescriber in [place] & they had incorrect fax number for [place].  Refill given over the phone & corrections made.  Patient verbalized understanding.

## 2015-04-11 NOTE — Telephone Encounter (Signed)
fenofibrate 54 MG tablet  Patient states that is has been out of medication now for 5 days.  Said that [facility] in [place] told him that they have sent request three times to our office

## 2015-04-15 ENCOUNTER — Telehealth: Payer: Self-pay | Admitting: Adult Health

## 2015-04-15 MED ORDER — LISINOPRIL 10 MG PO TABS: 5.0000 mg | ORAL_TABLET | Freq: Every day | ORAL | Status: DC

## 2015-04-15 NOTE — Telephone Encounter (Signed)
Will forward to covering, Ms lawrence NP

## 2015-04-15 NOTE — Telephone Encounter (Signed)
F/u regarding BP / states that his BP is still low / BP on arrival 104/76 HR 75 when finished BP 87/56 HR 68 / please call Manuela Schwartz back / tg

## 2015-04-15 NOTE — Telephone Encounter (Signed)
Hold lisinopril today if he hasn't taken it yet. Will decrease lisinopril to 5 mg daily starting tomorrow. Continue to keep track of BP at home. Recheck next week with nurse visit please

## 2015-04-15 NOTE — Telephone Encounter (Signed)
Pt had already taken Lisinopril today,will reduce dose to 5 mg starting tomorrow.He will have cardiac rehab call us in 1 week with his BP reading and he will keep a diary

## 2015-07-22 IMAGING — CR DG CHEST 2V
2 series · 2 of 2 positions shown · non-contrast
Comparison: 01/29/2015

CLINICAL DATA: Left-sided chest pain

EXAM:
CHEST  2 VIEW

[w chest pa]
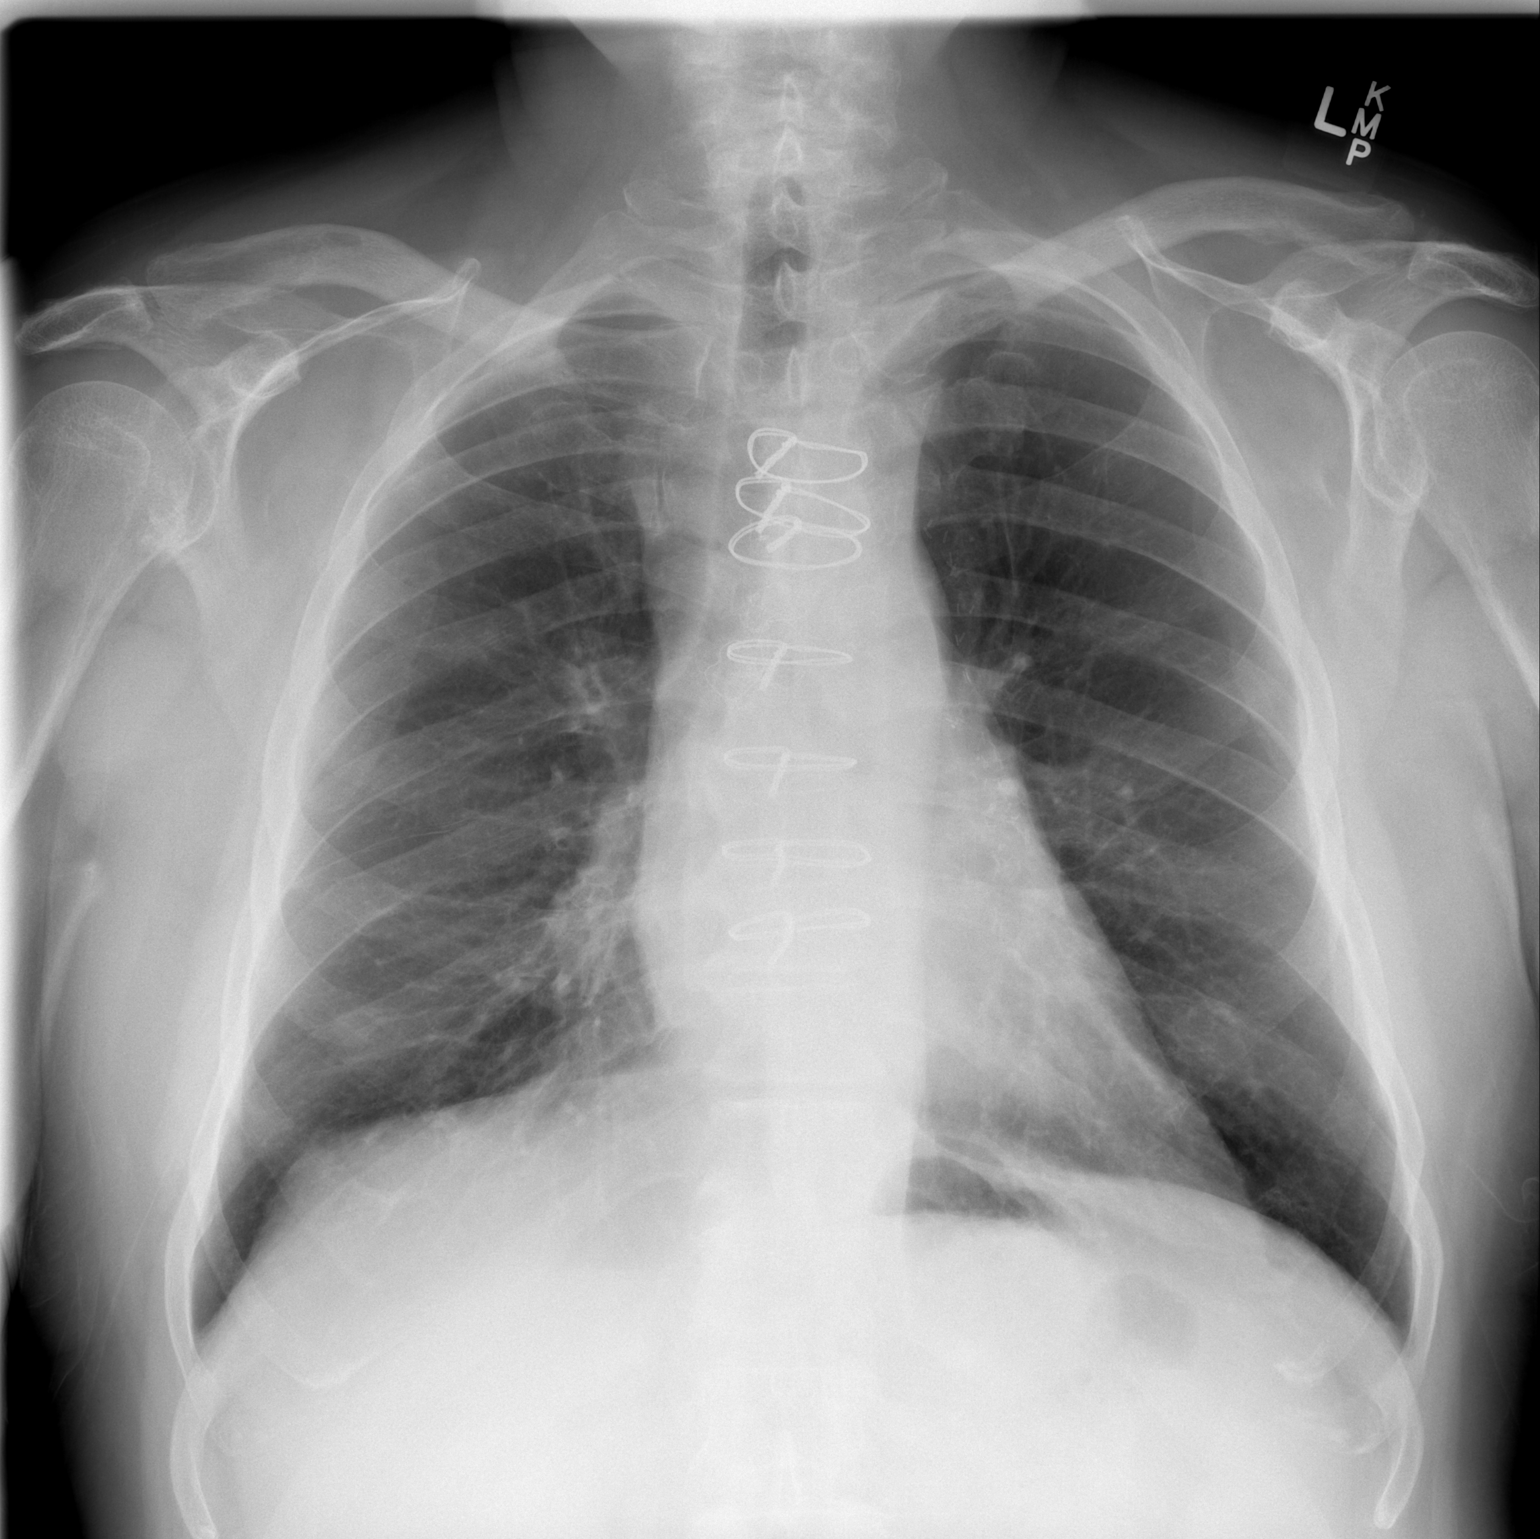

[w chest lat]
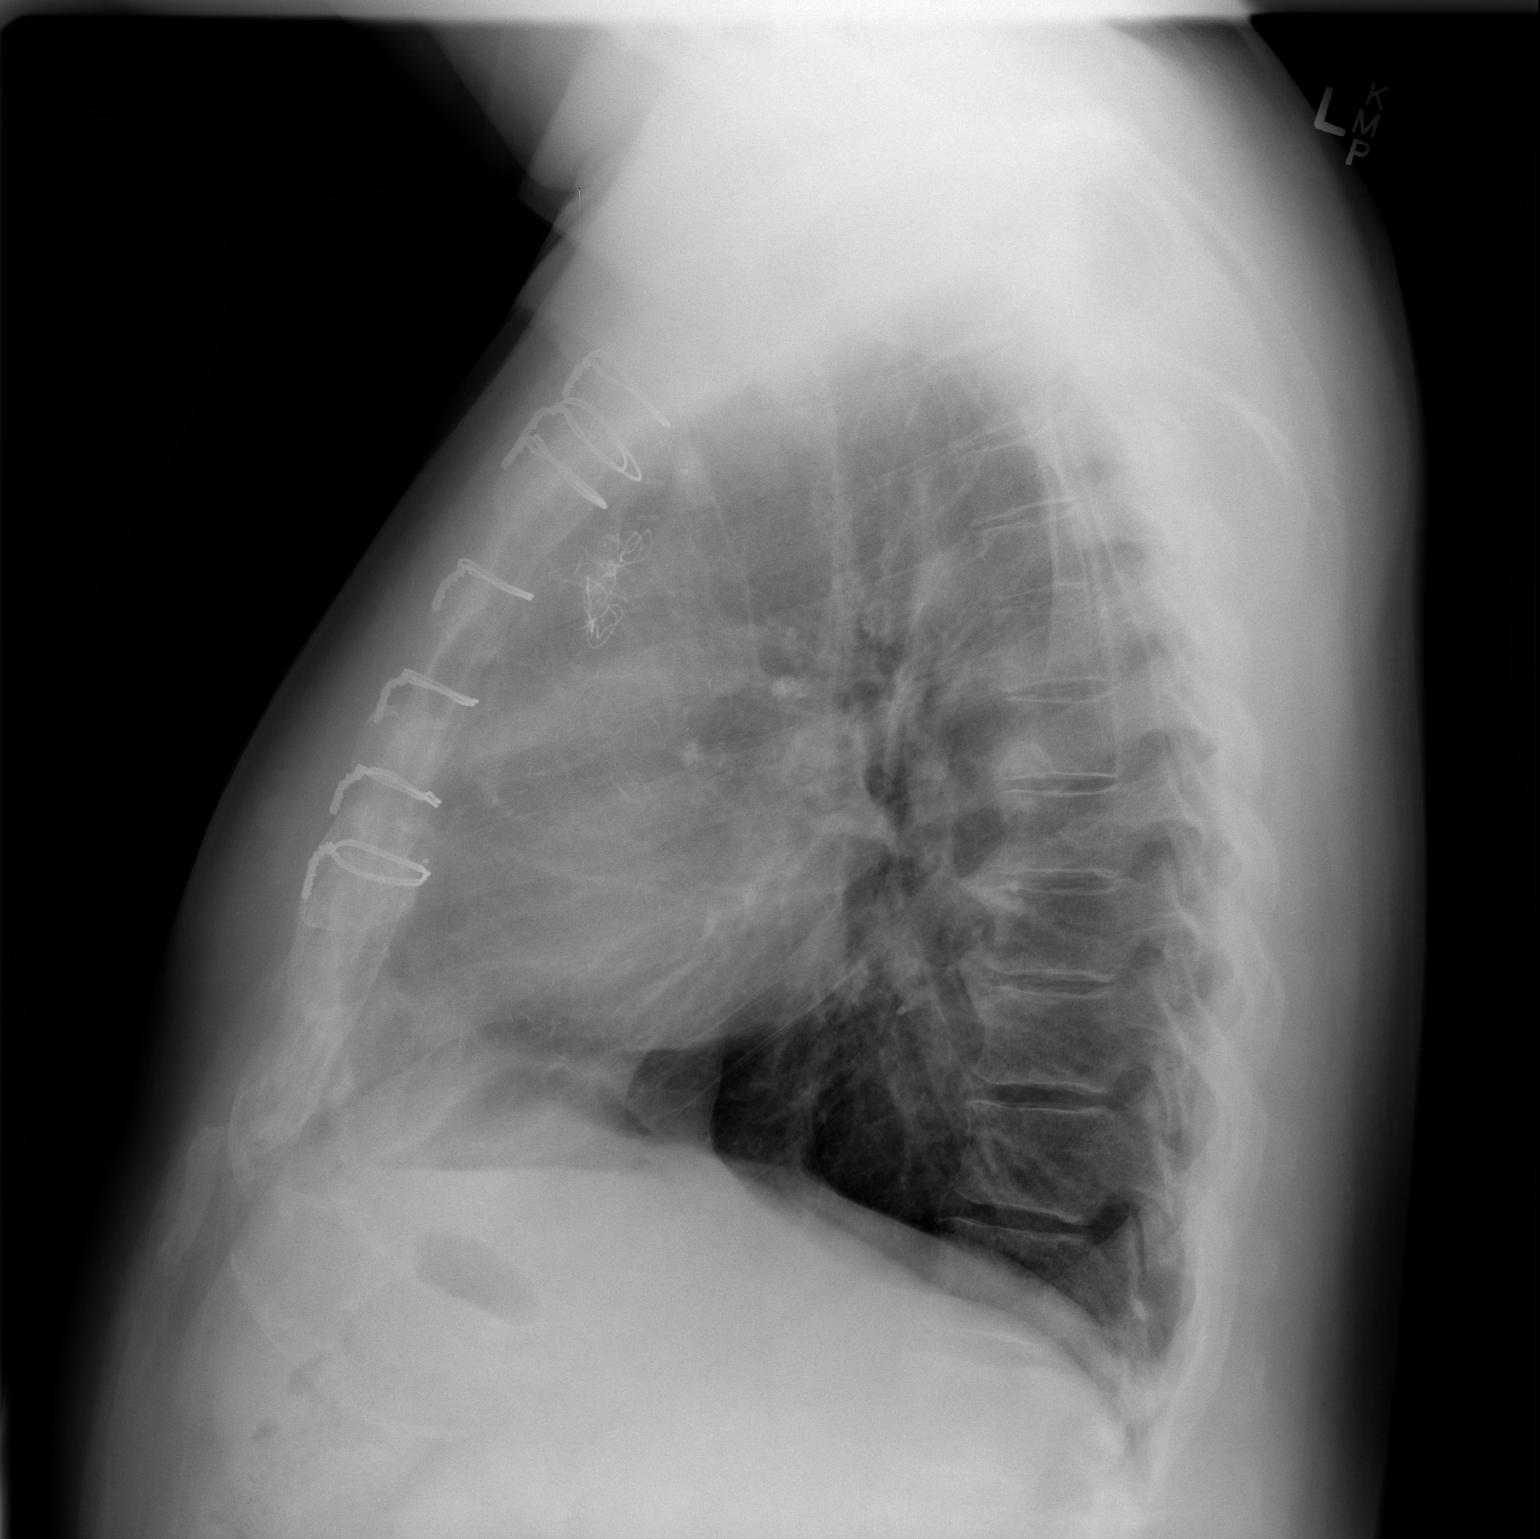

[2 of 2 positions shown; findings below may reference images not displayed]

FINDINGS: Cardiac shadow is within normal limits. Postoperative changes are
seen. The lungs are well aerated bilaterally without focal
infiltrate or sizable effusion. No acute bony abnormality is seen.
IMPRESSION: No acute abnormality noted.

## 2015-09-28 DIAGNOSIS — Q631 Lobulated, fused and horseshoe kidney: Secondary | ICD-10-CM | POA: Diagnosis not present

## 2015-09-28 DIAGNOSIS — Z79899 Other long term (current) drug therapy: Secondary | ICD-10-CM | POA: Diagnosis not present

## 2015-09-28 DIAGNOSIS — N200 Calculus of kidney: Secondary | ICD-10-CM | POA: Diagnosis not present

## 2015-09-28 DIAGNOSIS — N132 Hydronephrosis with renal and ureteral calculous obstruction: Secondary | ICD-10-CM | POA: Diagnosis not present

## 2015-10-06 ENCOUNTER — Other Ambulatory Visit: Payer: Self-pay | Admitting: *Deleted

## 2015-10-06 MED ORDER — ATORVASTATIN CALCIUM 80 MG PO TABS: 80.0000 mg | ORAL_TABLET | Freq: Every day | ORAL | Status: DC

## 2015-10-13 DIAGNOSIS — I252 Old myocardial infarction: Secondary | ICD-10-CM | POA: Diagnosis not present

## 2015-10-13 DIAGNOSIS — G6289 Other specified polyneuropathies: Secondary | ICD-10-CM | POA: Diagnosis not present

## 2015-10-13 DIAGNOSIS — K210 Gastro-esophageal reflux disease with esophagitis: Secondary | ICD-10-CM | POA: Diagnosis not present

## 2015-10-13 DIAGNOSIS — E7800 Pure hypercholesterolemia, unspecified: Secondary | ICD-10-CM | POA: Diagnosis not present

## 2015-10-13 DIAGNOSIS — E782 Mixed hyperlipidemia: Secondary | ICD-10-CM | POA: Diagnosis not present

## 2015-10-20 DIAGNOSIS — G6289 Other specified polyneuropathies: Secondary | ICD-10-CM | POA: Diagnosis not present

## 2015-10-20 DIAGNOSIS — E782 Mixed hyperlipidemia: Secondary | ICD-10-CM | POA: Diagnosis not present

## 2015-10-20 DIAGNOSIS — K219 Gastro-esophageal reflux disease without esophagitis: Secondary | ICD-10-CM | POA: Diagnosis not present

## 2015-10-20 DIAGNOSIS — I25709 Atherosclerosis of coronary artery bypass graft(s), unspecified, with unspecified angina pectoris: Secondary | ICD-10-CM | POA: Diagnosis not present

## 2015-10-20 DIAGNOSIS — I714 Abdominal aortic aneurysm, without rupture: Secondary | ICD-10-CM | POA: Diagnosis not present

## 2015-10-20 DIAGNOSIS — E6609 Other obesity due to excess calories: Secondary | ICD-10-CM | POA: Diagnosis not present

## 2015-11-07 ENCOUNTER — Other Ambulatory Visit: Payer: Self-pay | Admitting: *Deleted

## 2015-11-07 MED ORDER — FENOFIBRATE 54 MG PO TABS: 54.0000 mg | ORAL_TABLET | Freq: Every day | ORAL | Status: DC

## 2015-12-05 ENCOUNTER — Other Ambulatory Visit: Payer: Self-pay | Admitting: Cardiovascular Disease

## 2015-12-28 DIAGNOSIS — N200 Calculus of kidney: Secondary | ICD-10-CM | POA: Diagnosis not present

## 2015-12-28 DIAGNOSIS — I700 Atherosclerosis of aorta: Secondary | ICD-10-CM | POA: Diagnosis not present

## 2015-12-28 DIAGNOSIS — I714 Abdominal aortic aneurysm, without rupture: Secondary | ICD-10-CM | POA: Diagnosis not present

## 2016-01-07 ENCOUNTER — Other Ambulatory Visit: Payer: Self-pay | Admitting: Cardiovascular Disease

## 2016-01-10 ENCOUNTER — Other Ambulatory Visit: Payer: Self-pay | Admitting: Cardiovascular Disease

## 2016-01-13 ENCOUNTER — Other Ambulatory Visit: Payer: Self-pay | Admitting: Cardiovascular Disease

## 2016-01-13 ENCOUNTER — Ambulatory Visit (INDEPENDENT_AMBULATORY_CARE_PROVIDER_SITE_OTHER): Payer: Medicare Other | Admitting: Cardiovascular Disease

## 2016-01-13 ENCOUNTER — Encounter: Payer: Self-pay | Admitting: Cardiovascular Disease

## 2016-01-13 VITALS — BP 103/66 | HR 66 | Ht 69.0 in | Wt 226.0 lb

## 2016-01-13 DIAGNOSIS — Z951 Presence of aortocoronary bypass graft: Secondary | ICD-10-CM | POA: Diagnosis not present

## 2016-01-13 DIAGNOSIS — I25709 Atherosclerosis of coronary artery bypass graft(s), unspecified, with unspecified angina pectoris: Secondary | ICD-10-CM | POA: Diagnosis not present

## 2016-01-13 DIAGNOSIS — E785 Hyperlipidemia, unspecified: Secondary | ICD-10-CM | POA: Diagnosis not present

## 2016-01-13 DIAGNOSIS — I10 Essential (primary) hypertension: Secondary | ICD-10-CM

## 2016-01-13 MED ORDER — ATORVASTATIN CALCIUM 80 MG PO TABS: 80.0000 mg | ORAL_TABLET | Freq: Every day | ORAL | Status: DC

## 2016-01-13 NOTE — Progress Notes (Signed)
Patient ID: Ricardo Jimenez, male   DOB: 04-Apr-1951, 65 y.o.   MRN: [id number]      SUBJECTIVE: The patient returns for routine follow-up. He has a history of redo coronary artery bypass grafting 3, with a free right internal mammary graft to the diagonal, right radial artery graft to the OM 3, and left radial artery graft to the OM1. He previously underwent 4 vessel CABG in 06/2009.  He is feeling well and is without complaints. He currently denies chest pain and shortness of breath.   He enjoys bowling and playing golf.   Review of Systems: As per "subjective", otherwise negative.  No Known Allergies  Current Outpatient Prescriptions  Medication Sig Dispense Refill  . acetaminophen (TYLENOL) 650 MG CR tablet Take 1,300 mg by mouth every morning.     [name] aspirin EC 81 MG tablet Take 81 mg by mouth daily.    [name] atorvastatin (LIPITOR) 80 MG tablet TAKE ONE TABLET BY MOUTH ONCE DAILY *CONTACT OFFICE FOR APPOINTMENT, OVERDUE FOR FOLLOW UP* 15 tablet 0  . diphenhydramine-acetaminophen (TYLENOL PM EXTRA STRENGTH) 25-500 MG TABS Take 1 tablet by mouth at bedtime as needed. For sleep    . fenofibrate 54 MG tablet TAKE ONE TABLET BY MOUTH ONCE DAILY 30 tablet 0  . ferrous gluconate (FERGON) 324 MG tablet Take 1 tablet (324 mg total) by mouth 2 (two) times daily with a meal. (Patient taking differently: Take 324 mg by mouth daily with breakfast. ) 60 tablet 3  . gabapentin (NEURONTIN) 300 MG capsule Take 300-600 mg by mouth 2 (two) times daily. Take 300 mg in the morning and 600 mg at bedtime     . glucosamine-chondroitin 500-400 MG tablet Take 1 tablet by mouth 2 (two) times daily.     . isosorbide mononitrate (IMDUR) 30 MG 24 hr tablet Take 0.5 tablets (15 mg total) by mouth daily. 15 tablet 6  . lisinopril (PRINIVIL,ZESTRIL) 10 MG tablet Take 0.5 tablets (5 mg total) by mouth daily. 30 tablet 3  . metoprolol tartrate (LOPRESSOR) 25 MG tablet Take 25 mg by mouth 2 (two) times daily.     . Multiple  Vitamin (MULTIVITAMIN) tablet Take 1 tablet by mouth daily.      . nitroGLYCERIN (NITROSTAT) 0.4 MG SL tablet Place 1 tablet (0.4 mg total) under the tongue every 5 (five) minutes as needed for chest pain. 25 tablet 4  . polyethylene glycol (MIRALAX / GLYCOLAX) packet Take 17 g by mouth daily. 14 each 0  . potassium citrate (UROCIT-K) 5 MEQ (540 MG) SR tablet Take 10 mEq by mouth 2 (two) times daily.     . ranitidine (ZANTAC) 150 MG tablet take 1 tablet by mouth twice a day 60 tablet 4  . traMADol (ULTRAM) 50 MG tablet Take 50 mg by mouth 2 (two) times daily.     No current facility-administered medications for this visit.    Past Medical History  Diagnosis Date  . CAD (coronary artery disease)     a. 06/2009 Cath: 3VD, nondominant RCA;  b. 06/2009 CABG x 4: LIMA->LAD, VG->OM1, VG->OM3, VG->D1;  c. 03/2011 ETT: normal;  d. 12/2013 NSTEMI/Cath/PCI: LM nl, LAD 100p, LCX 100p, RCA nl, LIMA->LAD ok, VG->Diag 39m, VG->OM1 99d(3.5x18 Xience DES), VG->OM3 100d, EF 55%.  [name] Hypercholesterolemia   . PUD (peptic ulcer disease)   . 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 GERD (gastroesophageal reflux disease)   . Osteoarthritis   . Horseshoe kidney   . HTN (hypertension)  pt denies this  . Heart murmur     as a child    Past Surgical History  Procedure Laterality Date  . Other surgical history      BIL.CARPAL TUNNEL SURG.  . Rt.knee replacement    . Cardiac catheterization  CABG  . Kidney stone surgery Left   . Carpal tunnel release      bil  . Knee arthroscopy      rt x2, lft x3  . Coronary artery bypass graft  2010  . Hernia repair  2011    rt ing  . Total knee arthroplasty  02/11/2012    Procedure: TOTAL KNEE ARTHROPLASTY;  Surgeon: Rudean Haskell, MD;  Location: [facility];  Service: Orthopedics;  Laterality: Left;  left total knee replacement  . Left heart catheterization with coronary/graft angiogram N/A 12/28/2013    Procedure: LEFT HEART CATHETERIZATION WITH Beatrix Fetters;  Surgeon:  Larey Dresser, MD;  Location: [facility] C[facility] LAB;  Service: Cardiovascular;  Laterality: N/A;  . Left heart catheterization with coronary/graft angiogram N/A 09/19/2014    Procedure: LEFT HEART CATHETERIZATION WITH Beatrix Fetters;  Surgeon: Wellington Hampshire, MD;  Location: [facility] CATH LAB;  Service: Cardiovascular;  Laterality: N/A;  . Colonoscopy      x 2  . Vasectomy    . Coronary artery bypass graft N/A 01/27/2015    Procedure: REDO CORONARY ARTERY BYPASS GRAFTING (CABG) x  three , using right internal mammary artery and bilateral radial arteries;  Surgeon: [name], MD;  Location: [facility];  Service: Open Heart Surgery;  Laterality: N/A;  . Radial artery harvest Bilateral 01/27/2015    Procedure: BILATERAL RADIAL ARTERY HARVEST;  Surgeon: [name], MD;  Location: [facility];  Service: Open Heart Surgery;  Laterality: Bilateral;  BILATERAL RADIAL HARVEST & RIGHT IMA  . Tee without cardioversion N/A 01/27/2015    Procedure: TRANSESOPHAGEAL ECHOCARDIOGRAM (TEE);  Surgeon: [name], MD;  Location: [facility];  Service: Open Heart Surgery;  Laterality: N/A;    Social History   Social History  . Marital Status: Married    Spouse Name: N/A  . Number of Children: N/A  . Years of Education: N/A   Occupational History  . Not on file.   Social History Main Topics  . Smoking status: Never Smoker   . Smokeless tobacco: Never Used  . Alcohol Use: 0.0 oz/week    0 Standard drinks or equivalent per week     Comment: 07/22/14 Beer every now and then- AJ  . Drug Use: No  . Sexual Activity:    Partners: Female   Other Topics Concern  . Not on file   Social History Narrative   Married and lives in [place], [place] with his wife.  2 children.  Works as Photographer.     Filed Vitals:   01/13/16 1546  BP: 103/66  Pulse: 66  Height: 5\' 9"  (1.753 m)  Weight: 226 lb (102.513 kg)    PHYSICAL EXAM General: NAD HEENT: Normal. Neck: No JVD, no thyromegaly. Lungs: Clear to auscultation  bilaterally with normal respiratory effort. CV: Nondisplaced PMI. Regular rate and rhythm, normal S1/S2, no [id number], soft 1/6 systolic murmur over RUSB. No pretibial or periankle edema.No carotid bruit. Abdomen: Soft, nontender, obese. Neurologic: Alert and oriented x 3.  Psych: Normal affect. Skin: Normal. Musculoskeletal: No gross deformities. Extremities: No clubbing or cyanosis.   ECG: Most recent ECG reviewed.      ASSESSMENT AND PLAN: 1. CAD  with 3-vessel redo CABG in 01/2015: Symptomatically stable. Continue aspirin, Lipitor 80 mg, metoprolol, and Imdur.  2. Essential HTN: Controlled. No changes.  3. Hyperlipidemia: Continue high intensity statin therapy along with fenofibrate.   Dispo: f/u 1 year.   Kate Sable, M.D., F.A.C.C.

## 2016-01-13 NOTE — Addendum Note (Signed)
Addended by: Laurine Blazer on: 01/13/2016 04:21 PM   Modules accepted: Orders

## 2016-01-13 NOTE — Patient Instructions (Signed)
Continue all current medications. Your physician wants you to follow up in:  1 year.  You will receive a reminder letter in the mail one-two months in advance.  If you don't receive a letter, please call our office to schedule the follow up appointment   

## 2016-02-27 DIAGNOSIS — K600 Acute anal fissure: Secondary | ICD-10-CM | POA: Diagnosis not present

## 2016-03-26 ENCOUNTER — Other Ambulatory Visit: Payer: Self-pay | Admitting: *Deleted

## 2016-03-26 MED ORDER — ISOSORBIDE MONONITRATE ER 30 MG PO TB24: 15.0000 mg | ORAL_TABLET | Freq: Every day | ORAL | Status: DC

## 2016-04-05 DIAGNOSIS — N400 Enlarged prostate without lower urinary tract symptoms: Secondary | ICD-10-CM | POA: Diagnosis not present

## 2016-04-05 DIAGNOSIS — K210 Gastro-esophageal reflux disease with esophagitis: Secondary | ICD-10-CM | POA: Diagnosis not present

## 2016-04-05 DIAGNOSIS — I25709 Atherosclerosis of coronary artery bypass graft(s), unspecified, with unspecified angina pectoris: Secondary | ICD-10-CM | POA: Diagnosis not present

## 2016-04-05 DIAGNOSIS — E782 Mixed hyperlipidemia: Secondary | ICD-10-CM | POA: Diagnosis not present

## 2016-04-05 DIAGNOSIS — E7800 Pure hypercholesterolemia, unspecified: Secondary | ICD-10-CM | POA: Diagnosis not present

## 2016-04-12 DIAGNOSIS — E782 Mixed hyperlipidemia: Secondary | ICD-10-CM | POA: Diagnosis not present

## 2016-04-12 DIAGNOSIS — I714 Abdominal aortic aneurysm, without rupture: Secondary | ICD-10-CM | POA: Diagnosis not present

## 2016-04-12 DIAGNOSIS — K219 Gastro-esophageal reflux disease without esophagitis: Secondary | ICD-10-CM | POA: Diagnosis not present

## 2016-04-12 DIAGNOSIS — G6289 Other specified polyneuropathies: Secondary | ICD-10-CM | POA: Diagnosis not present

## 2016-04-12 DIAGNOSIS — Z1389 Encounter for screening for other disorder: Secondary | ICD-10-CM | POA: Diagnosis not present

## 2016-04-12 DIAGNOSIS — Z9189 Other specified personal risk factors, not elsewhere classified: Secondary | ICD-10-CM | POA: Diagnosis not present

## 2016-04-12 DIAGNOSIS — E6609 Other obesity due to excess calories: Secondary | ICD-10-CM | POA: Diagnosis not present

## 2016-04-12 DIAGNOSIS — I25709 Atherosclerosis of coronary artery bypass graft(s), unspecified, with unspecified angina pectoris: Secondary | ICD-10-CM | POA: Diagnosis not present

## 2016-07-09 DIAGNOSIS — Z6835 Body mass index (BMI) 35.0-35.9, adult: Secondary | ICD-10-CM | POA: Diagnosis not present

## 2016-07-09 DIAGNOSIS — J0101 Acute recurrent maxillary sinusitis: Secondary | ICD-10-CM | POA: Diagnosis not present

## 2016-07-24 DIAGNOSIS — Q828 Other specified congenital malformations of skin: Secondary | ICD-10-CM | POA: Diagnosis not present

## 2016-07-24 DIAGNOSIS — L280 Lichen simplex chronicus: Secondary | ICD-10-CM | POA: Diagnosis not present

## 2016-07-24 DIAGNOSIS — D485 Neoplasm of uncertain behavior of skin: Secondary | ICD-10-CM | POA: Diagnosis not present

## 2016-07-24 DIAGNOSIS — L570 Actinic keratosis: Secondary | ICD-10-CM | POA: Diagnosis not present

## 2016-10-12 DIAGNOSIS — I25709 Atherosclerosis of coronary artery bypass graft(s), unspecified, with unspecified angina pectoris: Secondary | ICD-10-CM | POA: Diagnosis not present

## 2016-10-12 DIAGNOSIS — I714 Abdominal aortic aneurysm, without rupture: Secondary | ICD-10-CM | POA: Diagnosis not present

## 2016-10-12 DIAGNOSIS — K210 Gastro-esophageal reflux disease with esophagitis: Secondary | ICD-10-CM | POA: Diagnosis not present

## 2016-10-12 DIAGNOSIS — E7800 Pure hypercholesterolemia, unspecified: Secondary | ICD-10-CM | POA: Diagnosis not present

## 2016-10-12 DIAGNOSIS — G6289 Other specified polyneuropathies: Secondary | ICD-10-CM | POA: Diagnosis not present

## 2016-10-12 DIAGNOSIS — Z9189 Other specified personal risk factors, not elsewhere classified: Secondary | ICD-10-CM | POA: Diagnosis not present

## 2016-10-12 DIAGNOSIS — E782 Mixed hyperlipidemia: Secondary | ICD-10-CM | POA: Diagnosis not present

## 2016-10-18 DIAGNOSIS — Z23 Encounter for immunization: Secondary | ICD-10-CM | POA: Diagnosis not present

## 2016-10-18 DIAGNOSIS — Z1212 Encounter for screening for malignant neoplasm of rectum: Secondary | ICD-10-CM | POA: Diagnosis not present

## 2016-10-18 DIAGNOSIS — Z0001 Encounter for general adult medical examination with abnormal findings: Secondary | ICD-10-CM | POA: Diagnosis not present

## 2016-10-18 DIAGNOSIS — G6289 Other specified polyneuropathies: Secondary | ICD-10-CM | POA: Diagnosis not present

## 2016-10-18 DIAGNOSIS — I25709 Atherosclerosis of coronary artery bypass graft(s), unspecified, with unspecified angina pectoris: Secondary | ICD-10-CM | POA: Diagnosis not present

## 2016-10-18 DIAGNOSIS — Z9189 Other specified personal risk factors, not elsewhere classified: Secondary | ICD-10-CM | POA: Diagnosis not present

## 2016-10-18 DIAGNOSIS — E6609 Other obesity due to excess calories: Secondary | ICD-10-CM | POA: Diagnosis not present

## 2016-10-18 DIAGNOSIS — E782 Mixed hyperlipidemia: Secondary | ICD-10-CM | POA: Diagnosis not present

## 2016-11-05 DIAGNOSIS — R05 Cough: Secondary | ICD-10-CM | POA: Diagnosis not present

## 2016-11-05 DIAGNOSIS — Z6835 Body mass index (BMI) 35.0-35.9, adult: Secondary | ICD-10-CM | POA: Diagnosis not present

## 2016-11-05 DIAGNOSIS — J019 Acute sinusitis, unspecified: Secondary | ICD-10-CM | POA: Diagnosis not present

## 2016-12-26 DIAGNOSIS — H2513 Age-related nuclear cataract, bilateral: Secondary | ICD-10-CM | POA: Diagnosis not present

## 2017-01-01 DIAGNOSIS — M65342 Trigger finger, left ring finger: Secondary | ICD-10-CM | POA: Diagnosis not present

## 2017-01-01 DIAGNOSIS — M79645 Pain in left finger(s): Secondary | ICD-10-CM | POA: Diagnosis not present

## 2017-01-07 ENCOUNTER — Other Ambulatory Visit: Payer: Self-pay | Admitting: Cardiovascular Disease

## 2017-01-14 ENCOUNTER — Ambulatory Visit (INDEPENDENT_AMBULATORY_CARE_PROVIDER_SITE_OTHER): Payer: Medicare Other | Admitting: Cardiovascular Disease

## 2017-01-14 ENCOUNTER — Encounter: Payer: Self-pay | Admitting: Cardiovascular Disease

## 2017-01-14 VITALS — BP 98/63 | HR 60 | Ht 69.0 in | Wt 218.8 lb

## 2017-01-14 DIAGNOSIS — I10 Essential (primary) hypertension: Secondary | ICD-10-CM

## 2017-01-14 DIAGNOSIS — I25708 Atherosclerosis of coronary artery bypass graft(s), unspecified, with other forms of angina pectoris: Secondary | ICD-10-CM | POA: Diagnosis not present

## 2017-01-14 DIAGNOSIS — I209 Angina pectoris, unspecified: Secondary | ICD-10-CM

## 2017-01-14 DIAGNOSIS — E7800 Pure hypercholesterolemia, unspecified: Secondary | ICD-10-CM

## 2017-01-14 NOTE — Progress Notes (Signed)
SUBJECTIVE: The patient returns for routine follow-up. He has a history of redo coronary artery bypass grafting 3 on 02/16/15, with a free right internal mammary graft to the diagonal, right radial artery graft to the OM 3, and left radial artery graft to the OM1. He previously underwent 4 vessel CABG in 06/2009.  In the past one month he has had occasional left upper sided chest tightness with no associated shortness of breath. The symptoms are very different than his typical anginal symptoms, when he had severe retrosternal chest pain. He walks frequently without exertional symptoms and also bowls. He has taken nitroglycerin for these upper left-sided chest pains which sometimes alleviated them and sometimes did not.  ECG performed in the office today which I personally reviewed demonstrates normal sinus rhythm with no ischemic ST segment or T-wave abnormalities, nor any arrhythmias.    Review of Systems: As per "subjective", otherwise negative.  No Known Allergies  Current Outpatient Prescriptions  Medication Sig Dispense Refill  . acetaminophen (TYLENOL) 650 MG CR tablet Take 1,300 mg by mouth every morning.     [name] aspirin EC 81 MG tablet Take 81 mg by mouth daily.    [name] atorvastatin (LIPITOR) 80 MG tablet Take 1 tablet (80 mg total) by mouth daily. 90 tablet 3  . diphenhydramine-acetaminophen (TYLENOL PM EXTRA STRENGTH) 25-500 MG TABS Take 1 tablet by mouth at bedtime as needed. For sleep    . fenofibrate 54 MG tablet TAKE ONE TABLET BY MOUTH ONCE DAILY 30 tablet 0  . ferrous gluconate (FERGON) 324 MG tablet Take 1 tablet (324 mg total) by mouth 2 (two) times daily with a meal. (Patient taking differently: Take 324 mg by mouth daily with breakfast. ) 60 tablet 3  . gabapentin (NEURONTIN) 300 MG capsule Take 300-600 mg by mouth 2 (two) times daily. Take 300 mg in the morning and 600 mg at bedtime     . glucosamine-chondroitin 500-400 MG tablet Take 1 tablet by mouth 2 (two) times  daily.     . isosorbide mononitrate (IMDUR) 30 MG 24 hr tablet Take 0.5 tablets (15 mg total) by mouth daily. 45 tablet 3  . lisinopril (PRINIVIL,ZESTRIL) 10 MG tablet Take 0.5 tablets (5 mg total) by mouth daily. 30 tablet 3  . metoprolol tartrate (LOPRESSOR) 25 MG tablet Take 25 mg by mouth 2 (two) times daily.     . Multiple Vitamin (MULTIVITAMIN) tablet Take 1 tablet by mouth daily.      . nitroGLYCERIN (NITROSTAT) 0.4 MG SL tablet Place 1 tablet (0.4 mg total) under the tongue every 5 (five) minutes as needed for chest pain. 25 tablet 4  . polyethylene glycol (MIRALAX / GLYCOLAX) packet Take 17 g by mouth daily. 14 each 0  . potassium citrate (UROCIT-K) 5 MEQ (540 MG) SR tablet Take 10 mEq by mouth 2 (two) times daily.     . ranitidine (ZANTAC) 150 MG tablet take 1 tablet by mouth twice a day 60 tablet 4  . traMADol (ULTRAM) 50 MG tablet Take 50 mg by mouth 2 (two) times daily.     No current facility-administered medications for this visit.     Past Medical History:  Diagnosis Date  . CAD (coronary artery disease)    a. 06/2009 Cath: 3VD, nondominant RCA;  b. 06/2009 CABG x 4: LIMA->LAD, VG->OM1, VG->OM3, VG->D1;  c. 03/2011 ETT: normal;  d. 12/2013 NSTEMI/Cath/PCI: LM nl, LAD 100p, LCX 100p, RCA nl, LIMA->LAD ok, VG->Diag 41m, VG->OM1 99d(3.5x18  Xience DES), VG->OM3 100d, EF 55%.  [name] GERD (gastroesophageal reflux disease)   . Heart murmur    as a child  . Horseshoe kidney   . HTN (hypertension)    pt denies this  . Hypercholesterolemia   . Osteoarthritis   . PUD (peptic ulcer disease)   . Stones in the urinary tract     Past Surgical History:  Procedure Laterality Date  . CARDIAC CATHETERIZATION  CABG  . CARPAL TUNNEL RELEASE     bil  . COLONOSCOPY     x 2  . CORONARY ARTERY BYPASS GRAFT  2010  . CORONARY ARTERY BYPASS GRAFT N/A 01/27/2015   Procedure: REDO CORONARY ARTERY BYPASS GRAFTING (CABG) x  three , using right internal mammary artery and bilateral radial arteries;   Surgeon: [name], MD;  Location: [facility];  Service: Open Heart Surgery;  Laterality: N/A;  . HERNIA REPAIR  2011   rt ing  . KIDNEY STONE SURGERY Left   . KNEE ARTHROSCOPY     rt x2, lft x3  . LEFT HEART CATHETERIZATION WITH CORONARY/GRAFT ANGIOGRAM N/A 12/28/2013   Procedure: LEFT HEART CATHETERIZATION WITH Beatrix Fetters;  Surgeon: Larey Dresser, MD;  Location: [facility] CATH LAB;  Service: Cardiovascular;  Laterality: N/A;  . LEFT HEART CATHETERIZATION WITH CORONARY/GRAFT ANGIOGRAM N/A 09/19/2014   Procedure: LEFT HEART CATHETERIZATION WITH Beatrix Fetters;  Surgeon: Wellington Hampshire, MD;  Location: [facility] CATH LAB;  Service: Cardiovascular;  Laterality: N/A;  . OTHER SURGICAL HISTORY     BIL.CARPAL TUNNEL SURG.  . RADIAL ARTERY HARVEST Bilateral 01/27/2015   Procedure: BILATERAL RADIAL ARTERY HARVEST;  Surgeon: [name], MD;  Location: [facility];  Service: Open Heart Surgery;  Laterality: Bilateral;  BILATERAL RADIAL HARVEST & RIGHT IMA  . RT.KNEE REPLACEMENT    . TEE WITHOUT CARDIOVERSION N/A 01/27/2015   Procedure: TRANSESOPHAGEAL ECHOCARDIOGRAM (TEE);  Surgeon: [name], MD;  Location: [facility];  Service: Open Heart Surgery;  Laterality: N/A;  . TOTAL KNEE ARTHROPLASTY  02/11/2012   Procedure: TOTAL KNEE ARTHROPLASTY;  Surgeon: Rudean Haskell, MD;  Location: [facility];  Service: Orthopedics;  Laterality: Left;  left total knee replacement  . VASECTOMY      Social History   Social History  . Marital status: Married    Spouse name: N/A  . Number of children: N/A  . Years of education: N/A   Occupational History  . Not on file.   Social History Main Topics  . Smoking status: Never Smoker  . Smokeless tobacco: Never Used  . Alcohol use 0.0 oz/week     Comment: 07/22/14 Beer every now and then- AJ  . Drug use: No  . Sexual activity: Yes    Partners: Female   Other Topics Concern  . Not on file   Social History Narrative   Married and lives in [place], [place] with  his wife.  2 children.  Works as Photographer.     Vitals:   01/14/17 1104  BP: 98/63  Pulse: 60  SpO2: 97%  Weight: 218 lb 12.8 oz (99.2 kg)  Height: 5\' 9"  (1.753 m)    PHYSICAL EXAM General: NAD HEENT: Normal. Neck: No JVD, no thyromegaly. Lungs: Clear to auscultation bilaterally with normal respiratory effort. CV: Nondisplaced PMI.  Regular rate and rhythm, normal S1/S2, no S3/S4, no murmur. No pretibial or periankle edema.  No carotid bruit.   Abdomen: Soft, nontender, no distention.  Neurologic: Alert and oriented.  Psych: Normal affect. Skin: Normal. Musculoskeletal: No gross deformities.    ECG: Most recent ECG reviewed.      ASSESSMENT AND PLAN:  1. CAD with 3-vessel redo CABG in 01/2015: Symptomatically stable. Continue aspirin, Lipitor 80 mg, metoprolol, and Imdur.  2. Essential HTN: Controlled. No changes.  3. Hyperlipidemia: Continue high intensity statin therapy along with fenofibrate.   Dispo: f/u 1 year.   Kate Sable, M.D., F.A.C.C.

## 2017-01-14 NOTE — Patient Instructions (Signed)
Your physician wants you to follow-up in: 1 YEAR WITH DR KONESWARAN You will receive a reminder letter in the mail two months in advance. If you don't receive a letter, please call our office to schedule the follow-up appointment.  Your physician recommends that you continue on your current medications as directed. Please refer to the Current Medication list given to you today.  Thank you for choosing [facility]!!    

## 2017-01-16 ENCOUNTER — Other Ambulatory Visit: Payer: Self-pay | Admitting: Cardiovascular Disease

## 2017-01-17 DIAGNOSIS — H02403 Unspecified ptosis of bilateral eyelids: Secondary | ICD-10-CM | POA: Diagnosis not present

## 2017-01-17 DIAGNOSIS — H2512 Age-related nuclear cataract, left eye: Secondary | ICD-10-CM | POA: Diagnosis not present

## 2017-01-17 DIAGNOSIS — H2513 Age-related nuclear cataract, bilateral: Secondary | ICD-10-CM | POA: Diagnosis not present

## 2017-01-17 DIAGNOSIS — H25013 Cortical age-related cataract, bilateral: Secondary | ICD-10-CM | POA: Diagnosis not present

## 2017-01-17 DIAGNOSIS — H02833 Dermatochalasis of right eye, unspecified eyelid: Secondary | ICD-10-CM | POA: Diagnosis not present

## 2017-01-29 DIAGNOSIS — H25812 Combined forms of age-related cataract, left eye: Secondary | ICD-10-CM | POA: Diagnosis not present

## 2017-01-29 DIAGNOSIS — H2512 Age-related nuclear cataract, left eye: Secondary | ICD-10-CM | POA: Diagnosis not present

## 2017-02-03 ENCOUNTER — Other Ambulatory Visit: Payer: Self-pay | Admitting: Cardiovascular Disease

## 2017-02-11 DIAGNOSIS — H25011 Cortical age-related cataract, right eye: Secondary | ICD-10-CM | POA: Diagnosis not present

## 2017-02-11 DIAGNOSIS — H2511 Age-related nuclear cataract, right eye: Secondary | ICD-10-CM | POA: Diagnosis not present

## 2017-02-19 DIAGNOSIS — H2511 Age-related nuclear cataract, right eye: Secondary | ICD-10-CM | POA: Diagnosis not present

## 2017-02-19 DIAGNOSIS — H25811 Combined forms of age-related cataract, right eye: Secondary | ICD-10-CM | POA: Diagnosis not present

## 2017-02-25 DIAGNOSIS — L280 Lichen simplex chronicus: Secondary | ICD-10-CM | POA: Diagnosis not present

## 2017-02-25 DIAGNOSIS — L570 Actinic keratosis: Secondary | ICD-10-CM | POA: Diagnosis not present

## 2017-02-25 DIAGNOSIS — D485 Neoplasm of uncertain behavior of skin: Secondary | ICD-10-CM | POA: Diagnosis not present

## 2017-04-01 ENCOUNTER — Other Ambulatory Visit: Payer: Self-pay | Admitting: Cardiovascular Disease

## 2017-04-19 DIAGNOSIS — Z23 Encounter for immunization: Secondary | ICD-10-CM | POA: Diagnosis not present

## 2017-04-19 DIAGNOSIS — G6289 Other specified polyneuropathies: Secondary | ICD-10-CM | POA: Diagnosis not present

## 2017-04-19 DIAGNOSIS — K219 Gastro-esophageal reflux disease without esophagitis: Secondary | ICD-10-CM | POA: Diagnosis not present

## 2017-04-19 DIAGNOSIS — Z6834 Body mass index (BMI) 34.0-34.9, adult: Secondary | ICD-10-CM | POA: Diagnosis not present

## 2017-04-19 DIAGNOSIS — E6609 Other obesity due to excess calories: Secondary | ICD-10-CM | POA: Diagnosis not present

## 2017-04-19 DIAGNOSIS — I25709 Atherosclerosis of coronary artery bypass graft(s), unspecified, with unspecified angina pectoris: Secondary | ICD-10-CM | POA: Diagnosis not present

## 2017-04-19 DIAGNOSIS — Z1212 Encounter for screening for malignant neoplasm of rectum: Secondary | ICD-10-CM | POA: Diagnosis not present

## 2017-04-19 DIAGNOSIS — Z9189 Other specified personal risk factors, not elsewhere classified: Secondary | ICD-10-CM | POA: Diagnosis not present

## 2017-04-19 DIAGNOSIS — E782 Mixed hyperlipidemia: Secondary | ICD-10-CM | POA: Diagnosis not present

## 2017-04-19 DIAGNOSIS — I714 Abdominal aortic aneurysm, without rupture: Secondary | ICD-10-CM | POA: Diagnosis not present

## 2017-04-25 DIAGNOSIS — N200 Calculus of kidney: Secondary | ICD-10-CM | POA: Diagnosis not present

## 2017-04-25 DIAGNOSIS — N179 Acute kidney failure, unspecified: Secondary | ICD-10-CM | POA: Diagnosis not present

## 2017-04-25 DIAGNOSIS — Q631 Lobulated, fused and horseshoe kidney: Secondary | ICD-10-CM | POA: Diagnosis not present

## 2017-04-26 DIAGNOSIS — E7800 Pure hypercholesterolemia, unspecified: Secondary | ICD-10-CM | POA: Diagnosis not present

## 2017-04-26 DIAGNOSIS — Z9189 Other specified personal risk factors, not elsewhere classified: Secondary | ICD-10-CM | POA: Diagnosis not present

## 2017-04-26 DIAGNOSIS — K210 Gastro-esophageal reflux disease with esophagitis: Secondary | ICD-10-CM | POA: Diagnosis not present

## 2017-04-26 DIAGNOSIS — E782 Mixed hyperlipidemia: Secondary | ICD-10-CM | POA: Diagnosis not present

## 2017-04-26 DIAGNOSIS — N179 Acute kidney failure, unspecified: Secondary | ICD-10-CM | POA: Diagnosis not present

## 2017-05-06 DIAGNOSIS — M5431 Sciatica, right side: Secondary | ICD-10-CM | POA: Diagnosis not present

## 2017-05-06 DIAGNOSIS — M545 Low back pain: Secondary | ICD-10-CM | POA: Diagnosis not present

## 2017-05-06 DIAGNOSIS — Z6835 Body mass index (BMI) 35.0-35.9, adult: Secondary | ICD-10-CM | POA: Diagnosis not present

## 2017-05-08 DIAGNOSIS — M545 Low back pain: Secondary | ICD-10-CM | POA: Diagnosis not present

## 2017-05-08 DIAGNOSIS — M5416 Radiculopathy, lumbar region: Secondary | ICD-10-CM | POA: Diagnosis not present

## 2017-05-10 DIAGNOSIS — M545 Low back pain: Secondary | ICD-10-CM | POA: Diagnosis not present

## 2017-05-10 DIAGNOSIS — M5416 Radiculopathy, lumbar region: Secondary | ICD-10-CM | POA: Diagnosis not present

## 2017-05-13 DIAGNOSIS — M545 Low back pain: Secondary | ICD-10-CM | POA: Diagnosis not present

## 2017-05-13 DIAGNOSIS — M5416 Radiculopathy, lumbar region: Secondary | ICD-10-CM | POA: Diagnosis not present

## 2017-05-16 DIAGNOSIS — M5416 Radiculopathy, lumbar region: Secondary | ICD-10-CM | POA: Diagnosis not present

## 2017-05-16 DIAGNOSIS — M545 Low back pain: Secondary | ICD-10-CM | POA: Diagnosis not present

## 2017-05-20 DIAGNOSIS — M5416 Radiculopathy, lumbar region: Secondary | ICD-10-CM | POA: Diagnosis not present

## 2017-05-20 DIAGNOSIS — M545 Low back pain: Secondary | ICD-10-CM | POA: Diagnosis not present

## 2017-05-23 DIAGNOSIS — M5416 Radiculopathy, lumbar region: Secondary | ICD-10-CM | POA: Diagnosis not present

## 2017-05-23 DIAGNOSIS — M545 Low back pain: Secondary | ICD-10-CM | POA: Diagnosis not present

## 2017-05-27 DIAGNOSIS — E7800 Pure hypercholesterolemia, unspecified: Secondary | ICD-10-CM | POA: Diagnosis not present

## 2017-05-27 DIAGNOSIS — K210 Gastro-esophageal reflux disease with esophagitis: Secondary | ICD-10-CM | POA: Diagnosis not present

## 2017-05-27 DIAGNOSIS — M545 Low back pain: Secondary | ICD-10-CM | POA: Diagnosis not present

## 2017-05-27 DIAGNOSIS — Z9189 Other specified personal risk factors, not elsewhere classified: Secondary | ICD-10-CM | POA: Diagnosis not present

## 2017-05-27 DIAGNOSIS — M5416 Radiculopathy, lumbar region: Secondary | ICD-10-CM | POA: Diagnosis not present

## 2017-05-30 DIAGNOSIS — M5416 Radiculopathy, lumbar region: Secondary | ICD-10-CM | POA: Diagnosis not present

## 2017-05-30 DIAGNOSIS — M545 Low back pain: Secondary | ICD-10-CM | POA: Diagnosis not present

## 2017-06-03 DIAGNOSIS — M5416 Radiculopathy, lumbar region: Secondary | ICD-10-CM | POA: Diagnosis not present

## 2017-06-03 DIAGNOSIS — M545 Low back pain: Secondary | ICD-10-CM | POA: Diagnosis not present

## 2017-06-10 DIAGNOSIS — M5416 Radiculopathy, lumbar region: Secondary | ICD-10-CM | POA: Diagnosis not present

## 2017-06-10 DIAGNOSIS — M545 Low back pain: Secondary | ICD-10-CM | POA: Diagnosis not present

## 2017-06-13 DIAGNOSIS — M545 Low back pain: Secondary | ICD-10-CM | POA: Diagnosis not present

## 2017-06-13 DIAGNOSIS — M5416 Radiculopathy, lumbar region: Secondary | ICD-10-CM | POA: Diagnosis not present

## 2017-06-17 DIAGNOSIS — M545 Low back pain: Secondary | ICD-10-CM | POA: Diagnosis not present

## 2017-06-17 DIAGNOSIS — M5416 Radiculopathy, lumbar region: Secondary | ICD-10-CM | POA: Diagnosis not present

## 2017-06-20 DIAGNOSIS — M545 Low back pain: Secondary | ICD-10-CM | POA: Diagnosis not present

## 2017-06-20 DIAGNOSIS — M5416 Radiculopathy, lumbar region: Secondary | ICD-10-CM | POA: Diagnosis not present

## 2017-06-27 DIAGNOSIS — M5441 Lumbago with sciatica, right side: Secondary | ICD-10-CM | POA: Diagnosis not present

## 2017-07-01 DIAGNOSIS — H02834 Dermatochalasis of left upper eyelid: Secondary | ICD-10-CM | POA: Diagnosis not present

## 2017-07-01 DIAGNOSIS — Z961 Presence of intraocular lens: Secondary | ICD-10-CM | POA: Diagnosis not present

## 2017-07-01 DIAGNOSIS — H02831 Dermatochalasis of right upper eyelid: Secondary | ICD-10-CM | POA: Diagnosis not present

## 2017-07-01 DIAGNOSIS — H26493 Other secondary cataract, bilateral: Secondary | ICD-10-CM | POA: Diagnosis not present

## 2017-07-01 DIAGNOSIS — M5441 Lumbago with sciatica, right side: Secondary | ICD-10-CM | POA: Diagnosis not present

## 2017-07-01 DIAGNOSIS — H524 Presbyopia: Secondary | ICD-10-CM | POA: Diagnosis not present

## 2017-07-03 DIAGNOSIS — M7062 Trochanteric bursitis, left hip: Secondary | ICD-10-CM | POA: Diagnosis not present

## 2017-07-04 DIAGNOSIS — M5441 Lumbago with sciatica, right side: Secondary | ICD-10-CM | POA: Diagnosis not present

## 2017-07-04 DIAGNOSIS — M7072 Other bursitis of hip, left hip: Secondary | ICD-10-CM | POA: Diagnosis not present

## 2017-07-08 DIAGNOSIS — M5441 Lumbago with sciatica, right side: Secondary | ICD-10-CM | POA: Diagnosis not present

## 2017-07-08 DIAGNOSIS — M7072 Other bursitis of hip, left hip: Secondary | ICD-10-CM | POA: Diagnosis not present

## 2017-07-09 DIAGNOSIS — Z471 Aftercare following joint replacement surgery: Secondary | ICD-10-CM | POA: Diagnosis not present

## 2017-07-09 DIAGNOSIS — Z96653 Presence of artificial knee joint, bilateral: Secondary | ICD-10-CM | POA: Diagnosis not present

## 2017-07-09 DIAGNOSIS — Z4789 Encounter for other orthopedic aftercare: Secondary | ICD-10-CM | POA: Diagnosis not present

## 2017-07-12 DIAGNOSIS — M5441 Lumbago with sciatica, right side: Secondary | ICD-10-CM | POA: Diagnosis not present

## 2017-07-12 DIAGNOSIS — M7072 Other bursitis of hip, left hip: Secondary | ICD-10-CM | POA: Diagnosis not present

## 2017-07-15 DIAGNOSIS — M5441 Lumbago with sciatica, right side: Secondary | ICD-10-CM | POA: Diagnosis not present

## 2017-07-15 DIAGNOSIS — M7072 Other bursitis of hip, left hip: Secondary | ICD-10-CM | POA: Diagnosis not present

## 2017-08-05 DIAGNOSIS — H00015 Hordeolum externum left lower eyelid: Secondary | ICD-10-CM | POA: Diagnosis not present

## 2017-08-05 DIAGNOSIS — H00012 Hordeolum externum right lower eyelid: Secondary | ICD-10-CM | POA: Diagnosis not present

## 2017-08-15 DIAGNOSIS — H26491 Other secondary cataract, right eye: Secondary | ICD-10-CM | POA: Diagnosis not present

## 2017-08-15 DIAGNOSIS — H26492 Other secondary cataract, left eye: Secondary | ICD-10-CM | POA: Diagnosis not present

## 2017-08-15 DIAGNOSIS — Z961 Presence of intraocular lens: Secondary | ICD-10-CM | POA: Diagnosis not present

## 2017-09-06 ENCOUNTER — Other Ambulatory Visit: Payer: Self-pay | Admitting: Cardiovascular Disease

## 2017-09-25 ENCOUNTER — Other Ambulatory Visit: Payer: Self-pay | Admitting: Cardiovascular Disease

## 2017-10-21 DIAGNOSIS — H26492 Other secondary cataract, left eye: Secondary | ICD-10-CM | POA: Diagnosis not present

## 2017-10-25 DIAGNOSIS — R300 Dysuria: Secondary | ICD-10-CM | POA: Diagnosis not present

## 2017-10-25 DIAGNOSIS — R319 Hematuria, unspecified: Secondary | ICD-10-CM | POA: Diagnosis not present

## 2017-10-25 DIAGNOSIS — N390 Urinary tract infection, site not specified: Secondary | ICD-10-CM | POA: Diagnosis not present

## 2017-10-25 DIAGNOSIS — N289 Disorder of kidney and ureter, unspecified: Secondary | ICD-10-CM | POA: Diagnosis not present

## 2017-10-25 DIAGNOSIS — R109 Unspecified abdominal pain: Secondary | ICD-10-CM | POA: Diagnosis not present

## 2017-11-06 ENCOUNTER — Encounter: Payer: Self-pay | Admitting: Cardiovascular Disease

## 2017-11-06 DIAGNOSIS — E782 Mixed hyperlipidemia: Secondary | ICD-10-CM | POA: Diagnosis not present

## 2017-11-06 DIAGNOSIS — I25709 Atherosclerosis of coronary artery bypass graft(s), unspecified, with unspecified angina pectoris: Secondary | ICD-10-CM | POA: Diagnosis not present

## 2017-11-06 DIAGNOSIS — K210 Gastro-esophageal reflux disease with esophagitis: Secondary | ICD-10-CM | POA: Diagnosis not present

## 2017-11-06 DIAGNOSIS — N400 Enlarged prostate without lower urinary tract symptoms: Secondary | ICD-10-CM | POA: Diagnosis not present

## 2017-11-06 DIAGNOSIS — Z9189 Other specified personal risk factors, not elsewhere classified: Secondary | ICD-10-CM | POA: Diagnosis not present

## 2017-11-06 DIAGNOSIS — E7800 Pure hypercholesterolemia, unspecified: Secondary | ICD-10-CM | POA: Diagnosis not present

## 2017-11-06 DIAGNOSIS — N179 Acute kidney failure, unspecified: Secondary | ICD-10-CM | POA: Diagnosis not present

## 2017-11-08 DIAGNOSIS — E782 Mixed hyperlipidemia: Secondary | ICD-10-CM | POA: Diagnosis not present

## 2017-11-08 DIAGNOSIS — Z1212 Encounter for screening for malignant neoplasm of rectum: Secondary | ICD-10-CM | POA: Diagnosis not present

## 2017-11-08 DIAGNOSIS — K219 Gastro-esophageal reflux disease without esophagitis: Secondary | ICD-10-CM | POA: Diagnosis not present

## 2017-11-08 DIAGNOSIS — Z0001 Encounter for general adult medical examination with abnormal findings: Secondary | ICD-10-CM | POA: Diagnosis not present

## 2017-11-08 DIAGNOSIS — Z6835 Body mass index (BMI) 35.0-35.9, adult: Secondary | ICD-10-CM | POA: Diagnosis not present

## 2017-11-08 DIAGNOSIS — E6609 Other obesity due to excess calories: Secondary | ICD-10-CM | POA: Diagnosis not present

## 2017-11-08 DIAGNOSIS — Z9189 Other specified personal risk factors, not elsewhere classified: Secondary | ICD-10-CM | POA: Diagnosis not present

## 2017-11-08 DIAGNOSIS — I714 Abdominal aortic aneurysm, without rupture: Secondary | ICD-10-CM | POA: Diagnosis not present

## 2017-11-08 DIAGNOSIS — G6289 Other specified polyneuropathies: Secondary | ICD-10-CM | POA: Diagnosis not present

## 2017-11-08 DIAGNOSIS — I25709 Atherosclerosis of coronary artery bypass graft(s), unspecified, with unspecified angina pectoris: Secondary | ICD-10-CM | POA: Diagnosis not present

## 2017-11-08 DIAGNOSIS — Z23 Encounter for immunization: Secondary | ICD-10-CM | POA: Diagnosis not present

## 2017-11-25 DIAGNOSIS — Q631 Lobulated, fused and horseshoe kidney: Secondary | ICD-10-CM | POA: Diagnosis not present

## 2017-11-25 DIAGNOSIS — N200 Calculus of kidney: Secondary | ICD-10-CM | POA: Diagnosis not present

## 2017-11-27 DIAGNOSIS — Q631 Lobulated, fused and horseshoe kidney: Secondary | ICD-10-CM | POA: Diagnosis not present

## 2017-11-27 DIAGNOSIS — N200 Calculus of kidney: Secondary | ICD-10-CM | POA: Diagnosis not present

## 2017-11-27 DIAGNOSIS — R3129 Other microscopic hematuria: Secondary | ICD-10-CM | POA: Diagnosis not present

## 2017-11-27 DIAGNOSIS — N132 Hydronephrosis with renal and ureteral calculous obstruction: Secondary | ICD-10-CM | POA: Diagnosis not present

## 2017-11-28 DIAGNOSIS — R3129 Other microscopic hematuria: Secondary | ICD-10-CM | POA: Diagnosis not present

## 2017-12-11 DIAGNOSIS — N200 Calculus of kidney: Secondary | ICD-10-CM | POA: Diagnosis not present

## 2017-12-11 DIAGNOSIS — Q631 Lobulated, fused and horseshoe kidney: Secondary | ICD-10-CM | POA: Diagnosis not present

## 2017-12-20 ENCOUNTER — Ambulatory Visit: Payer: Medicare Other | Admitting: Cardiovascular Disease

## 2017-12-31 ENCOUNTER — Ambulatory Visit (INDEPENDENT_AMBULATORY_CARE_PROVIDER_SITE_OTHER): Payer: Medicare Other | Admitting: Cardiovascular Disease

## 2017-12-31 ENCOUNTER — Encounter: Payer: Self-pay | Admitting: *Deleted

## 2017-12-31 ENCOUNTER — Encounter: Payer: Self-pay | Admitting: Cardiovascular Disease

## 2017-12-31 VITALS — BP 110/64 | HR 67 | Ht 69.0 in | Wt 235.0 lb

## 2017-12-31 DIAGNOSIS — Z01818 Encounter for other preprocedural examination: Secondary | ICD-10-CM

## 2017-12-31 DIAGNOSIS — I10 Essential (primary) hypertension: Secondary | ICD-10-CM

## 2017-12-31 DIAGNOSIS — E7800 Pure hypercholesterolemia, unspecified: Secondary | ICD-10-CM

## 2017-12-31 DIAGNOSIS — Z951 Presence of aortocoronary bypass graft: Secondary | ICD-10-CM

## 2017-12-31 DIAGNOSIS — I25708 Atherosclerosis of coronary artery bypass graft(s), unspecified, with other forms of angina pectoris: Secondary | ICD-10-CM | POA: Diagnosis not present

## 2017-12-31 NOTE — Progress Notes (Signed)
SUBJECTIVE: The patient returns for routine follow-up. He has a history of redo coronary artery bypass grafting 3 on 02/16/15, with a free right internal mammary graft to the diagonal, right radial artery graft to the OM 3, and left radial artery graft to the OM1. He previously underwent 4 vessel CABG in 06/2009.  The patient denies any symptoms of chest pain, palpitations, shortness of breath, lightheadedness, dizziness, leg swelling, orthopnea, PND, and syncope.  I personally reviewed an ECG performed today which demonstrates sinus rhythm with no significant ischemic ST segment or T wave abnormalities, nor any arrhythmias.  He is being considered for surgery for a right kidney stone which is not amenable to shockwave lithotripsy.    Review of Systems: As per "subjective", otherwise negative.  No Known Allergies  Current Outpatient Medications  Medication Sig Dispense Refill  . acetaminophen (TYLENOL) 650 MG CR tablet Take 1,300 mg by mouth every morning.     [name] aspirin EC 81 MG tablet Take 81 mg by mouth daily.    [name] atorvastatin (LIPITOR) 80 MG tablet TAKE ONE TABLET BY MOUTH ONCE DAILY 90 tablet 3  . diphenhydramine-acetaminophen (TYLENOL PM EXTRA STRENGTH) 25-500 MG TABS Take 1 tablet by mouth at bedtime as needed. For sleep    . fenofibrate 54 MG tablet TAKE 1 TABLET BY MOUTH ONCE DAILY 90 tablet 3  . ferrous gluconate (FERGON) 324 MG tablet Take 1 tablet (324 mg total) by mouth 2 (two) times daily with a meal. (Patient taking differently: Take 324 mg by mouth daily with breakfast. ) 60 tablet 3  . gabapentin (NEURONTIN) 300 MG capsule Take 300-600 mg by mouth 2 (two) times daily. Take 300 mg in the morning and 600 mg at bedtime     . glucosamine-chondroitin 500-400 MG tablet Take 1 tablet by mouth 2 (two) times daily.     . isosorbide mononitrate (IMDUR) 30 MG 24 hr tablet TAKE 1/2 (ONE-HALF) TABLET BY MOUTH ONCE DAILY 45 tablet 1  . metoprolol tartrate (LOPRESSOR) 25 MG  tablet Take 25 mg by mouth 2 (two) times daily.     . Multiple Vitamin (MULTIVITAMIN) tablet Take 1 tablet by mouth daily.      . nitroGLYCERIN (NITROSTAT) 0.4 MG SL tablet Place 1 tablet (0.4 mg total) under the tongue every 5 (five) minutes as needed for chest pain. 25 tablet 4  . polyethylene glycol (MIRALAX / GLYCOLAX) packet Take 17 g by mouth daily. 14 each 0  . potassium citrate (UROCIT-K) 5 MEQ (540 MG) SR tablet Take 10 mEq by mouth 2 (two) times daily.     . ranitidine (ZANTAC) 150 MG tablet take 1 tablet by mouth twice a day 60 tablet 4  . traMADol (ULTRAM) 50 MG tablet Take 50 mg by mouth 2 (two) times daily.     No current facility-administered medications for this visit.     Past Medical History:  Diagnosis Date  . CAD (coronary artery disease)    a. 06/2009 Cath: 3VD, nondominant RCA;  b. 06/2009 CABG x 4: LIMA->LAD, VG->OM1, VG->OM3, VG->D1;  c. 03/2011 ETT: normal;  d. 12/2013 NSTEMI/Cath/PCI: LM nl, LAD 100p, LCX 100p, RCA nl, LIMA->LAD ok, VG->Diag 58m, VG->OM1 99d(3.5x18 Xience DES), VG->OM3 100d, EF 55%.  [name] GERD (gastroesophageal reflux disease)   . Heart murmur    as a child  . Horseshoe kidney   . HTN (hypertension)    pt denies this  . Hypercholesterolemia   . Osteoarthritis   .  PUD (peptic ulcer disease)   . Stones in the urinary tract     Past Surgical History:  Procedure Laterality Date  . CARDIAC CATHETERIZATION  CABG  . CARPAL TUNNEL RELEASE     bil  . COLONOSCOPY     x 2  . CORONARY ARTERY BYPASS GRAFT  2010  . CORONARY ARTERY BYPASS GRAFT N/A 01/27/2015   Procedure: REDO CORONARY ARTERY BYPASS GRAFTING (CABG) x  three , using right internal mammary artery and bilateral radial arteries;  Surgeon: [name], MD;  Location: [facility];  Service: Open Heart Surgery;  Laterality: N/A;  . HERNIA REPAIR  2011   rt ing  . KIDNEY STONE SURGERY Left   . KNEE ARTHROSCOPY     rt x2, lft x3  . LEFT HEART CATHETERIZATION WITH CORONARY/GRAFT ANGIOGRAM N/A 12/28/2013     Procedure: LEFT HEART CATHETERIZATION WITH Beatrix Fetters;  Surgeon: Larey Dresser, MD;  Location: [facility] CATH LAB;  Service: Cardiovascular;  Laterality: N/A;  . LEFT HEART CATHETERIZATION WITH CORONARY/GRAFT ANGIOGRAM N/A 09/19/2014   Procedure: LEFT HEART CATHETERIZATION WITH Beatrix Fetters;  Surgeon: Wellington Hampshire, MD;  Location: [facility] CATH LAB;  Service: Cardiovascular;  Laterality: N/A;  . OTHER SURGICAL HISTORY     BIL.CARPAL TUNNEL SURG.  . RADIAL ARTERY HARVEST Bilateral 01/27/2015   Procedure: BILATERAL RADIAL ARTERY HARVEST;  Surgeon: [name], MD;  Location: [facility];  Service: Open Heart Surgery;  Laterality: Bilateral;  BILATERAL RADIAL HARVEST & RIGHT IMA  . RT.KNEE REPLACEMENT    . TEE WITHOUT CARDIOVERSION N/A 01/27/2015   Procedure: TRANSESOPHAGEAL ECHOCARDIOGRAM (TEE);  Surgeon: [name], MD;  Location: [facility];  Service: Open Heart Surgery;  Laterality: N/A;  . TOTAL KNEE ARTHROPLASTY  02/11/2012   Procedure: TOTAL KNEE ARTHROPLASTY;  Surgeon: Rudean Haskell, MD;  Location: [facility];  Service: Orthopedics;  Laterality: Left;  left total knee replacement  . VASECTOMY      Social History   Socioeconomic History  . Marital status: Married    Spouse name: Not on file  . Number of children: Not on file  . Years of education: Not on file  . Highest education level: Not on file  Social Needs  . Financial resource strain: Not on file  . Food insecurity - worry: Not on file  . Food insecurity - inability: Not on file  . Transportation needs - medical: Not on file  . Transportation needs - non-medical: Not on file  Occupational History  . Not on file  Tobacco Use  . Smoking status: Never Smoker  . Smokeless tobacco: Never Used  Substance and Sexual Activity  . Alcohol use: Yes    Alcohol/week: 0.0 oz    Comment: 07/22/14 Beer every now and then- AJ  . Drug use: No  . Sexual activity: Yes    Partners: Female  Other Topics Concern  . Not on file   Social History Narrative   Married and lives in [place], [place] with his wife.  2 children.  Works as Photographer.     Vitals:   12/31/17 1051  BP: 110/64  Pulse: 67  SpO2: 97%  Weight: 235 lb (106.6 kg)  Height: 5\' 9"  (1.753 m)    Wt Readings from Last 3 Encounters:  12/31/17 235 lb (106.6 kg)  01/14/17 218 lb 12.8 oz (99.2 kg)  01/13/16 226 lb (102.5 kg)     PHYSICAL EXAM General: NAD HEENT: Normal. Neck: No JVD, no thyromegaly.  Lungs: Clear to auscultation bilaterally with normal respiratory effort. CV: Regular rate and rhythm, normal S1/S2, no S3/S4, no murmur. No pretibial or periankle edema.  No carotid bruit.   Abdomen: Firm, obese.  Neurologic: Alert and oriented.  Psych: Normal affect. Skin: Normal. Musculoskeletal: No gross deformities.    ECG: Most recent ECG reviewed.   Labs:    Lipids: Lab Results  Component Value Date/Time   LDLCALC 101 (H) 09/19/2014 03:45 AM   CHOL 193 09/19/2014 03:45 AM   TRIG 280 (H) 09/19/2014 03:45 AM   HDL 36 (L) 09/19/2014 03:45 AM       ASSESSMENT AND PLAN:   1. CAD with 3-vessel redo CABG in 01/2015: Symptomatically stable. Continue aspirin, Lipitor 80 mg, metoprolol, and Imdur.  2. Essential HTN: Controlled. No changes.  3. Hyperlipidemia: Continue high intensity statin therapy along with fenofibrate.  I will obtain a copy of lipids from PCP.  4.  Preoperative risk stratification: He can proceed with kidney stone surgery with an acceptable level of risk.  No noninvasive cardiac studies are indicated at this time.   Disposition: Follow up 1 year   Kate Sable, M.D., F.A.C.C.

## 2017-12-31 NOTE — Patient Instructions (Signed)

## 2018-01-12 ENCOUNTER — Other Ambulatory Visit: Payer: Self-pay | Admitting: Cardiovascular Disease

## 2018-01-13 ENCOUNTER — Ambulatory Visit: Payer: Medicare Other | Admitting: Cardiovascular Disease

## 2018-02-18 DIAGNOSIS — M6530 Trigger finger, unspecified finger: Secondary | ICD-10-CM | POA: Diagnosis not present

## 2018-02-18 DIAGNOSIS — M19041 Primary osteoarthritis, right hand: Secondary | ICD-10-CM | POA: Diagnosis not present

## 2018-02-24 DIAGNOSIS — R3129 Other microscopic hematuria: Secondary | ICD-10-CM | POA: Diagnosis not present

## 2018-02-24 DIAGNOSIS — Q631 Lobulated, fused and horseshoe kidney: Secondary | ICD-10-CM | POA: Diagnosis not present

## 2018-02-24 DIAGNOSIS — N200 Calculus of kidney: Secondary | ICD-10-CM | POA: Diagnosis not present

## 2018-03-06 DIAGNOSIS — N200 Calculus of kidney: Secondary | ICD-10-CM | POA: Diagnosis not present

## 2018-03-12 DIAGNOSIS — I2510 Atherosclerotic heart disease of native coronary artery without angina pectoris: Secondary | ICD-10-CM | POA: Diagnosis not present

## 2018-03-12 DIAGNOSIS — B957 Other staphylococcus as the cause of diseases classified elsewhere: Secondary | ICD-10-CM | POA: Diagnosis not present

## 2018-03-12 DIAGNOSIS — N200 Calculus of kidney: Secondary | ICD-10-CM | POA: Diagnosis not present

## 2018-03-12 DIAGNOSIS — Q631 Lobulated, fused and horseshoe kidney: Secondary | ICD-10-CM | POA: Diagnosis not present

## 2018-03-12 DIAGNOSIS — R1031 Right lower quadrant pain: Secondary | ICD-10-CM | POA: Diagnosis not present

## 2018-03-12 DIAGNOSIS — N390 Urinary tract infection, site not specified: Secondary | ICD-10-CM | POA: Diagnosis not present

## 2018-03-25 DIAGNOSIS — M79675 Pain in left toe(s): Secondary | ICD-10-CM | POA: Diagnosis not present

## 2018-03-25 DIAGNOSIS — L03032 Cellulitis of left toe: Secondary | ICD-10-CM | POA: Diagnosis not present

## 2018-03-25 DIAGNOSIS — M79672 Pain in left foot: Secondary | ICD-10-CM | POA: Diagnosis not present

## 2018-03-25 DIAGNOSIS — L600 Ingrowing nail: Secondary | ICD-10-CM | POA: Diagnosis not present

## 2018-03-29 ENCOUNTER — Other Ambulatory Visit: Payer: Self-pay | Admitting: Cardiovascular Disease

## 2018-04-01 DIAGNOSIS — Z961 Presence of intraocular lens: Secondary | ICD-10-CM | POA: Diagnosis not present

## 2018-04-08 DIAGNOSIS — L600 Ingrowing nail: Secondary | ICD-10-CM | POA: Diagnosis not present

## 2018-04-08 DIAGNOSIS — L03032 Cellulitis of left toe: Secondary | ICD-10-CM | POA: Diagnosis not present

## 2018-04-08 DIAGNOSIS — M79675 Pain in left toe(s): Secondary | ICD-10-CM | POA: Diagnosis not present

## 2018-04-08 DIAGNOSIS — M79672 Pain in left foot: Secondary | ICD-10-CM | POA: Diagnosis not present

## 2018-04-25 DIAGNOSIS — E782 Mixed hyperlipidemia: Secondary | ICD-10-CM | POA: Diagnosis not present

## 2018-04-25 DIAGNOSIS — E039 Hypothyroidism, unspecified: Secondary | ICD-10-CM | POA: Diagnosis not present

## 2018-04-25 DIAGNOSIS — E7800 Pure hypercholesterolemia, unspecified: Secondary | ICD-10-CM | POA: Diagnosis not present

## 2018-04-25 DIAGNOSIS — N400 Enlarged prostate without lower urinary tract symptoms: Secondary | ICD-10-CM | POA: Diagnosis not present

## 2018-04-25 DIAGNOSIS — K210 Gastro-esophageal reflux disease with esophagitis: Secondary | ICD-10-CM | POA: Diagnosis not present

## 2018-04-25 DIAGNOSIS — N179 Acute kidney failure, unspecified: Secondary | ICD-10-CM | POA: Diagnosis not present

## 2018-04-25 DIAGNOSIS — Z9189 Other specified personal risk factors, not elsewhere classified: Secondary | ICD-10-CM | POA: Diagnosis not present

## 2018-04-30 DIAGNOSIS — Z1389 Encounter for screening for other disorder: Secondary | ICD-10-CM | POA: Diagnosis not present

## 2018-04-30 DIAGNOSIS — G6289 Other specified polyneuropathies: Secondary | ICD-10-CM | POA: Diagnosis not present

## 2018-04-30 DIAGNOSIS — I714 Abdominal aortic aneurysm, without rupture: Secondary | ICD-10-CM | POA: Diagnosis not present

## 2018-04-30 DIAGNOSIS — I25709 Atherosclerosis of coronary artery bypass graft(s), unspecified, with unspecified angina pectoris: Secondary | ICD-10-CM | POA: Diagnosis not present

## 2018-04-30 DIAGNOSIS — Z6835 Body mass index (BMI) 35.0-35.9, adult: Secondary | ICD-10-CM | POA: Diagnosis not present

## 2018-04-30 DIAGNOSIS — E782 Mixed hyperlipidemia: Secondary | ICD-10-CM | POA: Diagnosis not present

## 2018-04-30 DIAGNOSIS — I10 Essential (primary) hypertension: Secondary | ICD-10-CM | POA: Diagnosis not present

## 2018-04-30 DIAGNOSIS — Z1331 Encounter for screening for depression: Secondary | ICD-10-CM | POA: Diagnosis not present

## 2018-05-26 DIAGNOSIS — Q631 Lobulated, fused and horseshoe kidney: Secondary | ICD-10-CM | POA: Diagnosis not present

## 2018-05-26 DIAGNOSIS — R3129 Other microscopic hematuria: Secondary | ICD-10-CM | POA: Diagnosis not present

## 2018-05-26 DIAGNOSIS — N400 Enlarged prostate without lower urinary tract symptoms: Secondary | ICD-10-CM | POA: Diagnosis not present

## 2018-05-26 DIAGNOSIS — N200 Calculus of kidney: Secondary | ICD-10-CM | POA: Diagnosis not present

## 2018-06-16 DIAGNOSIS — Q631 Lobulated, fused and horseshoe kidney: Secondary | ICD-10-CM | POA: Diagnosis not present

## 2018-06-16 DIAGNOSIS — N400 Enlarged prostate without lower urinary tract symptoms: Secondary | ICD-10-CM | POA: Diagnosis not present

## 2018-06-16 DIAGNOSIS — R3129 Other microscopic hematuria: Secondary | ICD-10-CM | POA: Diagnosis not present

## 2018-06-16 DIAGNOSIS — N390 Urinary tract infection, site not specified: Secondary | ICD-10-CM | POA: Diagnosis not present

## 2018-06-16 DIAGNOSIS — N200 Calculus of kidney: Secondary | ICD-10-CM | POA: Diagnosis not present

## 2018-06-16 DIAGNOSIS — B957 Other staphylococcus as the cause of diseases classified elsewhere: Secondary | ICD-10-CM | POA: Diagnosis not present

## 2018-08-28 DIAGNOSIS — N200 Calculus of kidney: Secondary | ICD-10-CM | POA: Diagnosis not present

## 2018-08-28 DIAGNOSIS — N400 Enlarged prostate without lower urinary tract symptoms: Secondary | ICD-10-CM | POA: Diagnosis not present

## 2018-08-28 DIAGNOSIS — R3129 Other microscopic hematuria: Secondary | ICD-10-CM | POA: Diagnosis not present

## 2018-08-28 DIAGNOSIS — Q631 Lobulated, fused and horseshoe kidney: Secondary | ICD-10-CM | POA: Diagnosis not present

## 2018-08-29 ENCOUNTER — Other Ambulatory Visit: Payer: Self-pay | Admitting: Cardiovascular Disease

## 2018-10-14 DIAGNOSIS — M65332 Trigger finger, left middle finger: Secondary | ICD-10-CM | POA: Diagnosis not present

## 2018-11-07 DIAGNOSIS — E7800 Pure hypercholesterolemia, unspecified: Secondary | ICD-10-CM | POA: Diagnosis not present

## 2018-11-07 DIAGNOSIS — E782 Mixed hyperlipidemia: Secondary | ICD-10-CM | POA: Diagnosis not present

## 2018-11-07 DIAGNOSIS — N179 Acute kidney failure, unspecified: Secondary | ICD-10-CM | POA: Diagnosis not present

## 2018-11-07 DIAGNOSIS — R739 Hyperglycemia, unspecified: Secondary | ICD-10-CM | POA: Diagnosis not present

## 2018-11-07 DIAGNOSIS — K210 Gastro-esophageal reflux disease with esophagitis: Secondary | ICD-10-CM | POA: Diagnosis not present

## 2018-11-07 DIAGNOSIS — Z9189 Other specified personal risk factors, not elsewhere classified: Secondary | ICD-10-CM | POA: Diagnosis not present

## 2018-11-07 DIAGNOSIS — I10 Essential (primary) hypertension: Secondary | ICD-10-CM | POA: Diagnosis not present

## 2018-11-11 DIAGNOSIS — M65332 Trigger finger, left middle finger: Secondary | ICD-10-CM | POA: Diagnosis not present

## 2018-11-12 DIAGNOSIS — Z6835 Body mass index (BMI) 35.0-35.9, adult: Secondary | ICD-10-CM | POA: Diagnosis not present

## 2018-11-12 DIAGNOSIS — Z0001 Encounter for general adult medical examination with abnormal findings: Secondary | ICD-10-CM | POA: Diagnosis not present

## 2018-11-12 DIAGNOSIS — I10 Essential (primary) hypertension: Secondary | ICD-10-CM | POA: Diagnosis not present

## 2018-11-12 DIAGNOSIS — Z23 Encounter for immunization: Secondary | ICD-10-CM | POA: Diagnosis not present

## 2018-11-17 DIAGNOSIS — N400 Enlarged prostate without lower urinary tract symptoms: Secondary | ICD-10-CM | POA: Diagnosis not present

## 2018-11-17 DIAGNOSIS — N200 Calculus of kidney: Secondary | ICD-10-CM | POA: Diagnosis not present

## 2018-11-17 DIAGNOSIS — Z6833 Body mass index (BMI) 33.0-33.9, adult: Secondary | ICD-10-CM | POA: Diagnosis not present

## 2018-11-17 DIAGNOSIS — Q631 Lobulated, fused and horseshoe kidney: Secondary | ICD-10-CM | POA: Diagnosis not present

## 2018-11-20 DIAGNOSIS — E782 Mixed hyperlipidemia: Secondary | ICD-10-CM | POA: Diagnosis not present

## 2018-11-20 DIAGNOSIS — I10 Essential (primary) hypertension: Secondary | ICD-10-CM | POA: Diagnosis not present

## 2018-11-20 DIAGNOSIS — I25709 Atherosclerosis of coronary artery bypass graft(s), unspecified, with unspecified angina pectoris: Secondary | ICD-10-CM | POA: Diagnosis not present

## 2018-12-02 ENCOUNTER — Other Ambulatory Visit: Payer: Self-pay | Admitting: *Deleted

## 2018-12-02 MED ORDER — FENOFIBRATE 54 MG PO TABS: 54.0000 mg | ORAL_TABLET | Freq: Every day | ORAL | 2 refills | Status: DC

## 2018-12-03 DIAGNOSIS — G4730 Sleep apnea, unspecified: Secondary | ICD-10-CM | POA: Diagnosis not present

## 2018-12-03 DIAGNOSIS — R0683 Snoring: Secondary | ICD-10-CM | POA: Diagnosis not present

## 2018-12-03 DIAGNOSIS — I10 Essential (primary) hypertension: Secondary | ICD-10-CM | POA: Diagnosis not present

## 2018-12-04 DIAGNOSIS — R0683 Snoring: Secondary | ICD-10-CM | POA: Diagnosis not present

## 2018-12-04 DIAGNOSIS — I10 Essential (primary) hypertension: Secondary | ICD-10-CM | POA: Diagnosis not present

## 2018-12-04 DIAGNOSIS — G4730 Sleep apnea, unspecified: Secondary | ICD-10-CM | POA: Diagnosis not present

## 2018-12-11 DIAGNOSIS — Z5189 Encounter for other specified aftercare: Secondary | ICD-10-CM | POA: Diagnosis not present

## 2018-12-11 DIAGNOSIS — M65332 Trigger finger, left middle finger: Secondary | ICD-10-CM | POA: Diagnosis not present

## 2018-12-11 DIAGNOSIS — M25552 Pain in left hip: Secondary | ICD-10-CM | POA: Diagnosis not present

## 2018-12-11 DIAGNOSIS — M7062 Trochanteric bursitis, left hip: Secondary | ICD-10-CM | POA: Diagnosis not present

## 2018-12-11 DIAGNOSIS — M65312 Trigger thumb, left thumb: Secondary | ICD-10-CM | POA: Diagnosis not present

## 2018-12-13 DIAGNOSIS — R0683 Snoring: Secondary | ICD-10-CM | POA: Diagnosis not present

## 2018-12-13 DIAGNOSIS — G4730 Sleep apnea, unspecified: Secondary | ICD-10-CM | POA: Diagnosis not present

## 2018-12-13 DIAGNOSIS — I10 Essential (primary) hypertension: Secondary | ICD-10-CM | POA: Diagnosis not present

## 2019-01-08 ENCOUNTER — Other Ambulatory Visit: Payer: Self-pay

## 2019-01-08 MED ORDER — ATORVASTATIN CALCIUM 80 MG PO TABS: 80.0000 mg | ORAL_TABLET | Freq: Every day | ORAL | 3 refills | Status: DC

## 2019-01-12 ENCOUNTER — Telehealth: Payer: Self-pay | Admitting: Cardiovascular Disease

## 2019-01-12 NOTE — Telephone Encounter (Signed)
Appointment has been rescheduled to 04/28/2019.

## 2019-01-12 NOTE — Telephone Encounter (Signed)
I called and spoke with the patient about rescheduling his appointment given the COVID19 pandemic, in order to avoid unnecessary exposure.  He is stable from a cardiac standpoint with respect to symptoms and is very agreeable to having his appointment rescheduled to a later date and was thankful for the call. 

## 2019-01-16 ENCOUNTER — Ambulatory Visit: Payer: Medicare Other | Admitting: Cardiovascular Disease

## 2019-02-24 DIAGNOSIS — M65332 Trigger finger, left middle finger: Secondary | ICD-10-CM | POA: Diagnosis not present

## 2019-03-21 DIAGNOSIS — E782 Mixed hyperlipidemia: Secondary | ICD-10-CM | POA: Diagnosis not present

## 2019-03-21 DIAGNOSIS — I10 Essential (primary) hypertension: Secondary | ICD-10-CM | POA: Diagnosis not present

## 2019-03-23 ENCOUNTER — Other Ambulatory Visit: Payer: Self-pay

## 2019-03-23 MED ORDER — ISOSORBIDE MONONITRATE ER 30 MG PO TB24: ORAL_TABLET | ORAL | 3 refills | Status: DC

## 2019-03-23 NOTE — Telephone Encounter (Signed)
Refilled isosorbide

## 2019-03-24 DIAGNOSIS — M65312 Trigger thumb, left thumb: Secondary | ICD-10-CM | POA: Diagnosis not present

## 2019-03-24 DIAGNOSIS — M65332 Trigger finger, left middle finger: Secondary | ICD-10-CM | POA: Diagnosis not present

## 2019-04-15 ENCOUNTER — Telehealth: Payer: Self-pay | Admitting: *Deleted

## 2019-04-15 NOTE — Telephone Encounter (Signed)
Patient verbally consented for telehealth visits with [facility] and understands that his insurance company will be billed for the encounter.   Unable to check BP or HR but will have weight available.

## 2019-04-21 DIAGNOSIS — E785 Hyperlipidemia, unspecified: Secondary | ICD-10-CM | POA: Diagnosis not present

## 2019-04-21 DIAGNOSIS — I10 Essential (primary) hypertension: Secondary | ICD-10-CM | POA: Diagnosis not present

## 2019-04-28 ENCOUNTER — Encounter: Payer: Self-pay | Admitting: Cardiovascular Disease

## 2019-04-28 ENCOUNTER — Telehealth (INDEPENDENT_AMBULATORY_CARE_PROVIDER_SITE_OTHER): Payer: Medicare Other | Admitting: Cardiovascular Disease

## 2019-04-28 ENCOUNTER — Encounter: Payer: Self-pay | Admitting: *Deleted

## 2019-04-28 VITALS — Ht 69.0 in | Wt 230.0 lb

## 2019-04-28 DIAGNOSIS — I25708 Atherosclerosis of coronary artery bypass graft(s), unspecified, with other forms of angina pectoris: Secondary | ICD-10-CM

## 2019-04-28 DIAGNOSIS — I10 Essential (primary) hypertension: Secondary | ICD-10-CM

## 2019-04-28 DIAGNOSIS — Z951 Presence of aortocoronary bypass graft: Secondary | ICD-10-CM

## 2019-04-28 DIAGNOSIS — E785 Hyperlipidemia, unspecified: Secondary | ICD-10-CM

## 2019-04-28 NOTE — Patient Instructions (Signed)

## 2019-04-28 NOTE — Progress Notes (Signed)
Virtual Visit via Video Note   This visit type was conducted due to national recommendations for restrictions regarding the COVID-19 Pandemic (e.g. social distancing) in an effort to limit this patient's exposure and mitigate transmission in our community.  Due to his co-morbid illnesses, this patient is at least at moderate risk for complications without adequate follow up.  This format is felt to be most appropriate for this patient at this time.  All issues noted in this document were discussed and addressed.  A limited physical exam was performed with this format.  Please refer to the patient's chart for his consent to telehealth for [facility]   Date:  04/28/2019   ID:  Ricardo Jimenez, DOB 12-30-1950, MRN [id number]  Patient Location: Home Provider Location: Office  PCP:  Caryl Bis, MD  Cardiologist:  Kate Sable, MD  Electrophysiologist:  None   Evaluation Performed:  Follow-Up Visit  Chief Complaint:  CAD  History of Present Illness:    Ricardo Jimenez is a 68 y.o. male with coronary artery disease. He has a history of redo coronary artery bypass grafting 3on 02/16/15, with a free right internal mammary graft to the diagonal, right radial artery graft to the OM 3, and left radial artery graft to the OM1. He previously underwent 4 vessel CABG in 06/2009.  He occasionally has a pain underneath his left clavicle which is not associated with exertion.  It may last a minute and then resolves on its own.  He denies exertional chest pain and dyspnea.  He denies palpitations and leg swelling.  He has been golfing twice a week and doing some outdoor yard work.  The patient does not have symptoms concerning for COVID-19 infection (fever, chills, cough, or new shortness of breath).    Past Medical History:  Diagnosis Date  . CAD (coronary artery disease)    a. 06/2009 Cath: 3VD, nondominant RCA;  b. 06/2009 CABG x 4: LIMA->LAD, VG->OM1, VG->OM3, VG->D1;  c. 03/2011 ETT:  normal;  d. 12/2013 NSTEMI/Cath/PCI: LM nl, LAD 100p, LCX 100p, RCA nl, LIMA->LAD ok, VG->Diag 31m, VG->OM1 99d(3.5x18 Xience DES), VG->OM3 100d, EF 55%.  [name] GERD (gastroesophageal reflux disease)   . Heart murmur    as a child  . Horseshoe kidney   . HTN (hypertension)    pt denies this  . Hypercholesterolemia   . Osteoarthritis   . PUD (peptic ulcer disease)   . Stones in the urinary tract    Past Surgical History:  Procedure Laterality Date  . CARDIAC CATHETERIZATION  CABG  . CARPAL TUNNEL RELEASE     bil  . COLONOSCOPY     x 2  . CORONARY ARTERY BYPASS GRAFT  2010  . CORONARY ARTERY BYPASS GRAFT N/A 01/27/2015   Procedure: REDO CORONARY ARTERY BYPASS GRAFTING (CABG) x  three , using right internal mammary artery and bilateral radial arteries;  Surgeon: [name], MD;  Location: [place] OR;  Service: Open Heart Surgery;  Laterality: N/A;  . HERNIA REPAIR  2011   rt ing  . KIDNEY STONE SURGERY Left   . KNEE ARTHROSCOPY     rt x2, lft x3  . LEFT HEART CATHETERIZATION WITH CORONARY/GRAFT ANGIOGRAM N/A 12/28/2013   Procedure: LEFT HEART CATHETERIZATION WITH Beatrix Fetters;  Surgeon: Larey Dresser, MD;  Location: [facility] CATH LAB;  Service: Cardiovascular;  Laterality: N/A;  . LEFT HEART CATHETERIZATION WITH CORONARY/GRAFT ANGIOGRAM N/A 09/19/2014   Procedure: LEFT HEART CATHETERIZATION WITH CORONARY/GRAFT ANGIOGRAM;  Surgeon:  Wellington Hampshire, MD;  Location: [place] CATH LAB;  Service: Cardiovascular;  Laterality: N/A;  . OTHER SURGICAL HISTORY     BIL.CARPAL TUNNEL SURG.  . RADIAL ARTERY HARVEST Bilateral 01/27/2015   Procedure: BILATERAL RADIAL ARTERY HARVEST;  Surgeon: [name], MD;  Location: [place];  Service: Open Heart Surgery;  Laterality: Bilateral;  BILATERAL RADIAL HARVEST & RIGHT IMA  . RT.KNEE REPLACEMENT    . TEE WITHOUT CARDIOVERSION N/A 01/27/2015   Procedure: TRANSESOPHAGEAL ECHOCARDIOGRAM (TEE);  Surgeon: [name], MD;  Location: [place];  Service: Open Heart  Surgery;  Laterality: N/A;  . TOTAL KNEE ARTHROPLASTY  02/11/2012   Procedure: TOTAL KNEE ARTHROPLASTY;  Surgeon: Rudean Haskell, MD;  Location: [place];  Service: Orthopedics;  Laterality: Left;  left total knee replacement  . VASECTOMY       Current Meds  Medication Sig  . acetaminophen (TYLENOL) 650 MG CR tablet Take 1,300 mg by mouth every morning.   [name] aspirin EC 81 MG tablet Take 81 mg by mouth daily.  [name] atorvastatin (LIPITOR) 80 MG tablet Take 1 tablet (80 mg total) by mouth daily.  . diphenhydramine-acetaminophen (TYLENOL PM EXTRA STRENGTH) 25-500 MG TABS Take 1 tablet by mouth at bedtime as needed. For sleep  . famotidine (PEPCID) 20 MG tablet Take 20 mg by mouth 2 (two) times daily.  . fenofibrate 54 MG tablet Take 1 tablet (54 mg total) by mouth daily.  [name] gabapentin (NEURONTIN) 300 MG capsule Take 300-600 mg by mouth 2 (two) times daily. Take 300 mg in the morning and 600 mg at bedtime   . glucosamine-chondroitin 500-400 MG tablet Take 1 tablet by mouth 2 (two) times daily.   . isosorbide mononitrate (IMDUR) 30 MG 24 hr tablet TAKE 1/2 (ONE-HALF) TABLET BY MOUTH ONCE DAILY  . metoprolol tartrate (LOPRESSOR) 25 MG tablet Take 25 mg by mouth 2 (two) times daily.   . Multiple Vitamin (MULTIVITAMIN) tablet Take 1 tablet by mouth daily.    . nitroGLYCERIN (NITROSTAT) 0.4 MG SL tablet Place 1 tablet (0.4 mg total) under the tongue every 5 (five) minutes as needed for chest pain.  [name] omega-3 acid ethyl esters (LOVAZA) 1 g capsule Take 2 g by mouth 2 (two) times daily.  . potassium citrate (UROCIT-K) 5 MEQ (540 MG) SR tablet Take 10 mEq by mouth 2 (two) times daily.   . traMADol (ULTRAM) 50 MG tablet Take 50 mg by mouth as needed.      Allergies:   Patient has no known allergies.   Social History   Tobacco Use  . Smoking status: Never Smoker  . Smokeless tobacco: Never Used  Substance Use Topics  . Alcohol use: Yes    Alcohol/week: 0.0 standard drinks    Comment: 07/22/14 Beer every  now and then- AJ  . Drug use: No     Family Hx: The patient's family history includes COPD in his father; Hypertension in his mother.  ROS:   Please see the history of present illness.     All other systems reviewed and are negative.   Prior CV studies:   The following studies were reviewed today:  NA  Labs/Other Tests and Data Reviewed:    EKG:  No ECG reviewed.  Recent Labs: No results found for requested labs within last 8760 hours.   Recent Lipid Panel Lab Results  Component Value Date/Time   CHOL 193 09/19/2014 03:45 AM   TRIG 280 (H) 09/19/2014 03:45 AM  HDL 36 (L) 09/19/2014 03:45 AM   CHOLHDL 5.4 09/19/2014 03:45 AM   LDLCALC 101 (H) 09/19/2014 03:45 AM    Wt Readings from Last 3 Encounters:  04/28/19 230 lb (104.3 kg)  12/31/17 235 lb (106.6 kg)  01/14/17 218 lb 12.8 oz (99.2 kg)     Objective:    Vital Signs:  Ht 5\' 9"  (1.753 m)   Wt 230 lb (104.3 kg)   BMI 33.97 kg/m    VITAL SIGNS:  reviewed GEN:  no acute distress EYES:  sclerae anicteric, EOMI - Extraocular Movements Intact RESPIRATORY:  normal respiratory effort, symmetric expansion MUSCULOSKELETAL:  no obvious deformities. NEURO:  alert and oriented x 3, no obvious focal deficit PSYCH:  normal affect  ASSESSMENT & PLAN:    1. CAD with 3-vessel redo CABG in 01/2015: Symptomatically stable. Continue aspirin, Lipitor 80 mg, metoprolol, and Imdur.  2. Essential HTN: He does not own a BP cuff.  When he has it checked at his PCPs office it has been 117/72 most recently.  No changes to therapy.  3. Hyperlipidemia: Continue high intensity statin therapy along with fenofibrate.  I will obtain a copy of lipids from PCP.    COVID-19 Education: The signs and symptoms of COVID-19 were discussed with the patient and how to seek care for testing (follow up with PCP or arrange E-visit).  The importance of social distancing was discussed today.  Time:   Today, I have spent 10 minutes with the  patient with telehealth technology discussing the above problems.     Medication Adjustments/Labs and Tests Ordered: Current medicines are reviewed at length with the patient today.  Concerns regarding medicines are outlined above.   Tests Ordered: No orders of the defined types were placed in this encounter.   Medication Changes: No orders of the defined types were placed in this encounter.   Follow Up:  Virtual Visit or In Person in 1 year(s)  Signed, Kate Sable, MD  04/28/2019 9:37 AM    [place]

## 2019-05-06 DIAGNOSIS — E7800 Pure hypercholesterolemia, unspecified: Secondary | ICD-10-CM | POA: Diagnosis not present

## 2019-05-06 DIAGNOSIS — K210 Gastro-esophageal reflux disease with esophagitis: Secondary | ICD-10-CM | POA: Diagnosis not present

## 2019-05-06 DIAGNOSIS — E782 Mixed hyperlipidemia: Secondary | ICD-10-CM | POA: Diagnosis not present

## 2019-05-06 DIAGNOSIS — N400 Enlarged prostate without lower urinary tract symptoms: Secondary | ICD-10-CM | POA: Diagnosis not present

## 2019-05-06 DIAGNOSIS — R739 Hyperglycemia, unspecified: Secondary | ICD-10-CM | POA: Diagnosis not present

## 2019-05-06 DIAGNOSIS — I10 Essential (primary) hypertension: Secondary | ICD-10-CM | POA: Diagnosis not present

## 2019-05-06 DIAGNOSIS — I714 Abdominal aortic aneurysm, without rupture: Secondary | ICD-10-CM | POA: Diagnosis not present

## 2019-05-08 ENCOUNTER — Telehealth: Payer: Self-pay | Admitting: *Deleted

## 2019-05-08 NOTE — Telephone Encounter (Signed)
Notes recorded by Laurine Blazer, LPN on 3/81/8299 at 3:71 AM EDT  Patient notified. Stated that he just had new labs done within last few days by pmd. The initial labs were done in January 2020. Stated this will be the first time checking labs since starting the prescription strength Fish Oil. He prefers to wait for results on these current labs before changing anything. He will have pmd send copy of those labs.  ------   Notes recorded by Erma Heritage, PA-C on 05/07/2019 at 4:29 PM EDT  Lovaza and Vascepa are in a similar class so would recommend stopping the Lovaza as well. Thanks!  ------   Notes recorded by Laurine Blazer, LPN on 6/96/7893 at 8:10 PM EDT  Please clarify. Need to stop both the Fenofibrate and Lovaza before beginning the Vascepa.  ------   Notes recorded by Laurine Blazer, LPN on 1/75/1025 at 8:52 PM EDT  Patient notified. Is this okay to be taken with the Lovaza ?  ------   Notes recorded by Herminio Commons, MD on 04/29/2019 at 2:07 PM EDT  TG's are markedly elevated. Stop fenofibrate (it's not working) and start Vascepa 2 gm bid.

## 2019-05-11 DIAGNOSIS — I714 Abdominal aortic aneurysm, without rupture: Secondary | ICD-10-CM | POA: Diagnosis not present

## 2019-05-11 DIAGNOSIS — E1122 Type 2 diabetes mellitus with diabetic chronic kidney disease: Secondary | ICD-10-CM | POA: Diagnosis not present

## 2019-05-11 DIAGNOSIS — Z6836 Body mass index (BMI) 36.0-36.9, adult: Secondary | ICD-10-CM | POA: Diagnosis not present

## 2019-05-11 DIAGNOSIS — Z1389 Encounter for screening for other disorder: Secondary | ICD-10-CM | POA: Diagnosis not present

## 2019-05-11 DIAGNOSIS — K219 Gastro-esophageal reflux disease without esophagitis: Secondary | ICD-10-CM | POA: Diagnosis not present

## 2019-05-11 DIAGNOSIS — E782 Mixed hyperlipidemia: Secondary | ICD-10-CM | POA: Diagnosis not present

## 2019-05-11 DIAGNOSIS — I25709 Atherosclerosis of coronary artery bypass graft(s), unspecified, with unspecified angina pectoris: Secondary | ICD-10-CM | POA: Diagnosis not present

## 2019-05-11 DIAGNOSIS — I10 Essential (primary) hypertension: Secondary | ICD-10-CM | POA: Diagnosis not present

## 2019-05-15 DIAGNOSIS — M25552 Pain in left hip: Secondary | ICD-10-CM | POA: Diagnosis not present

## 2019-05-15 DIAGNOSIS — M7061 Trochanteric bursitis, right hip: Secondary | ICD-10-CM | POA: Diagnosis not present

## 2019-05-15 DIAGNOSIS — M25551 Pain in right hip: Secondary | ICD-10-CM | POA: Diagnosis not present

## 2019-05-22 DIAGNOSIS — I10 Essential (primary) hypertension: Secondary | ICD-10-CM | POA: Diagnosis not present

## 2019-05-22 DIAGNOSIS — E1165 Type 2 diabetes mellitus with hyperglycemia: Secondary | ICD-10-CM | POA: Diagnosis not present

## 2019-05-28 ENCOUNTER — Telehealth: Payer: Self-pay | Admitting: *Deleted

## 2019-05-28 DIAGNOSIS — M65332 Trigger finger, left middle finger: Secondary | ICD-10-CM | POA: Diagnosis not present

## 2019-05-28 DIAGNOSIS — M65312 Trigger thumb, left thumb: Secondary | ICD-10-CM | POA: Diagnosis not present

## 2019-05-28 MED ORDER — EZETIMIBE 10 MG PO TABS: 10.0000 mg | ORAL_TABLET | Freq: Every day | ORAL | 6 refills | Status: DC

## 2019-05-28 NOTE — Telephone Encounter (Signed)
Notes recorded by Laurine Blazer, LPN on 11/23/7979 at 02:54 AM EDT  Wife Benjamine Mola) notified. Will send new medication to [facility] now.  ------   Notes recorded by Herminio Commons, MD on 05/27/2019 at 12:56 PM EDT  LDL not at goal. Would add Zetia 10 mg.

## 2019-06-30 DIAGNOSIS — M7061 Trochanteric bursitis, right hip: Secondary | ICD-10-CM | POA: Diagnosis not present

## 2019-07-22 DIAGNOSIS — I10 Essential (primary) hypertension: Secondary | ICD-10-CM | POA: Diagnosis not present

## 2019-07-22 DIAGNOSIS — E785 Hyperlipidemia, unspecified: Secondary | ICD-10-CM | POA: Diagnosis not present

## 2019-08-04 DIAGNOSIS — H5212 Myopia, left eye: Secondary | ICD-10-CM | POA: Diagnosis not present

## 2019-08-04 DIAGNOSIS — H35413 Lattice degeneration of retina, bilateral: Secondary | ICD-10-CM | POA: Diagnosis not present

## 2019-08-21 DIAGNOSIS — I10 Essential (primary) hypertension: Secondary | ICD-10-CM | POA: Diagnosis not present

## 2019-08-21 DIAGNOSIS — E782 Mixed hyperlipidemia: Secondary | ICD-10-CM | POA: Diagnosis not present

## 2019-08-31 DIAGNOSIS — Z23 Encounter for immunization: Secondary | ICD-10-CM | POA: Diagnosis not present

## 2019-09-14 ENCOUNTER — Other Ambulatory Visit: Payer: Self-pay | Admitting: Cardiovascular Disease

## 2019-09-14 MED ORDER — FENOFIBRATE 54 MG PO TABS: 54.0000 mg | ORAL_TABLET | Freq: Every day | ORAL | 2 refills | Status: AC

## 2019-09-14 NOTE — Telephone Encounter (Signed)
°*  STAT* If patient is at the pharmacy, call can be transferred to refill team.   1. Which medications need to be refilled?  fenofibrate 54 MG tablet   2. Which pharmacy/location (including street and city if Management consultant) [facility]  3. Do they need a 30 day or 90 day supply?

## 2019-11-13 DIAGNOSIS — Q631 Lobulated, fused and horseshoe kidney: Secondary | ICD-10-CM | POA: Diagnosis not present

## 2019-11-13 DIAGNOSIS — N183 Chronic kidney disease, stage 3 unspecified: Secondary | ICD-10-CM | POA: Diagnosis not present

## 2019-11-13 DIAGNOSIS — R946 Abnormal results of thyroid function studies: Secondary | ICD-10-CM | POA: Diagnosis not present

## 2019-11-13 DIAGNOSIS — I10 Essential (primary) hypertension: Secondary | ICD-10-CM | POA: Diagnosis not present

## 2019-11-13 DIAGNOSIS — K2100 Gastro-esophageal reflux disease with esophagitis, without bleeding: Secondary | ICD-10-CM | POA: Diagnosis not present

## 2019-11-13 DIAGNOSIS — E782 Mixed hyperlipidemia: Secondary | ICD-10-CM | POA: Diagnosis not present

## 2019-11-13 DIAGNOSIS — E1122 Type 2 diabetes mellitus with diabetic chronic kidney disease: Secondary | ICD-10-CM | POA: Diagnosis not present

## 2019-11-13 DIAGNOSIS — N400 Enlarged prostate without lower urinary tract symptoms: Secondary | ICD-10-CM | POA: Diagnosis not present

## 2019-11-17 DIAGNOSIS — I25709 Atherosclerosis of coronary artery bypass graft(s), unspecified, with unspecified angina pectoris: Secondary | ICD-10-CM | POA: Diagnosis not present

## 2019-11-17 DIAGNOSIS — R4582 Worries: Secondary | ICD-10-CM | POA: Diagnosis not present

## 2019-11-17 DIAGNOSIS — Z0001 Encounter for general adult medical examination with abnormal findings: Secondary | ICD-10-CM | POA: Diagnosis not present

## 2019-11-17 DIAGNOSIS — I10 Essential (primary) hypertension: Secondary | ICD-10-CM | POA: Diagnosis not present

## 2019-11-17 DIAGNOSIS — Z1331 Encounter for screening for depression: Secondary | ICD-10-CM | POA: Diagnosis not present

## 2019-11-17 DIAGNOSIS — Z1389 Encounter for screening for other disorder: Secondary | ICD-10-CM | POA: Diagnosis not present

## 2019-11-17 DIAGNOSIS — I714 Abdominal aortic aneurysm, without rupture: Secondary | ICD-10-CM | POA: Diagnosis not present

## 2019-11-17 DIAGNOSIS — E1122 Type 2 diabetes mellitus with diabetic chronic kidney disease: Secondary | ICD-10-CM | POA: Diagnosis not present

## 2019-11-20 DIAGNOSIS — E7849 Other hyperlipidemia: Secondary | ICD-10-CM | POA: Diagnosis not present

## 2019-11-20 DIAGNOSIS — I10 Essential (primary) hypertension: Secondary | ICD-10-CM | POA: Diagnosis not present

## 2019-12-11 DIAGNOSIS — N400 Enlarged prostate without lower urinary tract symptoms: Secondary | ICD-10-CM | POA: Diagnosis not present

## 2019-12-11 DIAGNOSIS — R3129 Other microscopic hematuria: Secondary | ICD-10-CM | POA: Diagnosis not present

## 2019-12-11 DIAGNOSIS — N3000 Acute cystitis without hematuria: Secondary | ICD-10-CM | POA: Diagnosis not present

## 2019-12-11 DIAGNOSIS — Q631 Lobulated, fused and horseshoe kidney: Secondary | ICD-10-CM | POA: Diagnosis not present

## 2019-12-11 DIAGNOSIS — N200 Calculus of kidney: Secondary | ICD-10-CM | POA: Diagnosis not present

## 2019-12-22 DIAGNOSIS — Z23 Encounter for immunization: Secondary | ICD-10-CM | POA: Diagnosis not present

## 2019-12-31 ENCOUNTER — Other Ambulatory Visit: Payer: Self-pay | Admitting: Cardiovascular Disease

## 2020-01-06 DIAGNOSIS — R3129 Other microscopic hematuria: Secondary | ICD-10-CM | POA: Diagnosis not present

## 2020-01-18 DIAGNOSIS — R3129 Other microscopic hematuria: Secondary | ICD-10-CM | POA: Diagnosis not present

## 2020-01-19 DIAGNOSIS — Z23 Encounter for immunization: Secondary | ICD-10-CM | POA: Diagnosis not present

## 2020-01-20 DIAGNOSIS — I10 Essential (primary) hypertension: Secondary | ICD-10-CM | POA: Diagnosis not present

## 2020-01-20 DIAGNOSIS — E7849 Other hyperlipidemia: Secondary | ICD-10-CM | POA: Diagnosis not present

## 2020-01-29 DIAGNOSIS — N390 Urinary tract infection, site not specified: Secondary | ICD-10-CM | POA: Diagnosis not present

## 2020-01-29 DIAGNOSIS — R319 Hematuria, unspecified: Secondary | ICD-10-CM | POA: Diagnosis not present

## 2020-01-29 DIAGNOSIS — Q631 Lobulated, fused and horseshoe kidney: Secondary | ICD-10-CM | POA: Diagnosis not present

## 2020-01-29 DIAGNOSIS — N400 Enlarged prostate without lower urinary tract symptoms: Secondary | ICD-10-CM | POA: Diagnosis not present

## 2020-02-02 DIAGNOSIS — M5416 Radiculopathy, lumbar region: Secondary | ICD-10-CM | POA: Diagnosis not present

## 2020-02-02 DIAGNOSIS — M545 Low back pain: Secondary | ICD-10-CM | POA: Diagnosis not present

## 2020-02-16 DIAGNOSIS — M5136 Other intervertebral disc degeneration, lumbar region: Secondary | ICD-10-CM | POA: Diagnosis not present

## 2020-02-19 DIAGNOSIS — E7849 Other hyperlipidemia: Secondary | ICD-10-CM | POA: Diagnosis not present

## 2020-02-19 DIAGNOSIS — I10 Essential (primary) hypertension: Secondary | ICD-10-CM | POA: Diagnosis not present

## 2020-03-14 ENCOUNTER — Other Ambulatory Visit: Payer: Self-pay | Admitting: Cardiovascular Disease

## 2020-03-29 DIAGNOSIS — M79672 Pain in left foot: Secondary | ICD-10-CM | POA: Diagnosis not present

## 2020-03-29 DIAGNOSIS — M722 Plantar fascial fibromatosis: Secondary | ICD-10-CM | POA: Diagnosis not present

## 2020-03-29 DIAGNOSIS — M7732 Calcaneal spur, left foot: Secondary | ICD-10-CM | POA: Diagnosis not present

## 2020-04-26 DIAGNOSIS — N183 Chronic kidney disease, stage 3 unspecified: Secondary | ICD-10-CM | POA: Diagnosis not present

## 2020-04-26 DIAGNOSIS — E7800 Pure hypercholesterolemia, unspecified: Secondary | ICD-10-CM | POA: Diagnosis not present

## 2020-04-26 DIAGNOSIS — N400 Enlarged prostate without lower urinary tract symptoms: Secondary | ICD-10-CM | POA: Diagnosis not present

## 2020-04-26 DIAGNOSIS — N179 Acute kidney failure, unspecified: Secondary | ICD-10-CM | POA: Diagnosis not present

## 2020-04-26 DIAGNOSIS — E782 Mixed hyperlipidemia: Secondary | ICD-10-CM | POA: Diagnosis not present

## 2020-04-26 DIAGNOSIS — E1122 Type 2 diabetes mellitus with diabetic chronic kidney disease: Secondary | ICD-10-CM | POA: Diagnosis not present

## 2020-04-26 DIAGNOSIS — I10 Essential (primary) hypertension: Secondary | ICD-10-CM | POA: Diagnosis not present

## 2020-04-26 DIAGNOSIS — K2100 Gastro-esophageal reflux disease with esophagitis, without bleeding: Secondary | ICD-10-CM | POA: Diagnosis not present

## 2020-04-26 NOTE — Progress Notes (Addendum)
Cardiology Office Note  Date: 04/27/2020   ID: JD MCCASTER, DOB Jun 11, 1951, MRN [id number]  PCP:  Caryl Bis, MD  Cardiologist:  Kate Sable, MD Electrophysiologist:  None   Chief Complaint: Follow-up CAD  History of Present Illness: Ricardo Jimenez is a 69 y.o. male with a history of CAD with history of redo coronary artery bypass grafting x3 02/16/2015 and with a free RIMA to diagonal, right radial artery graft to OM 3, and left radial artery graft to OM1.  Prior to that he underwent a four-vessel CABG in 2010.  Last visit via telemedicine Dr. Bronson Ing 04/28/2019.  He was occasionally having pain underneath his left clavicle which was not associated with exertion.  It usually lasted a minute and then resolved on its own.  He denied any exertional chest pain or dyspnea.  Denies any palpitations or lower extremity edema.  Had been golfing twice a week and doing some outdoor yard work.  He was symptomatically stable and continuing aspirin, Lipitor, metoprolol and Imdur.  He was continuing high intensity statin therapy and copy of lipids from PCP was requested.  Blood pressure was well controlled.  He denies any recent acute illnesses, hospitalizations.  Denies any progressive anginal or exertional symptoms.  Orthostatic symptoms other than some mild dizziness when he lays back in his recliner.  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  Denies any CVA or TIA-like symptoms, PND or orthopnea, palpitations or arrhythmias, bleeding, claudication, DVT or PE symptoms, or lower extremity edema.   Past Medical History:  Diagnosis Date  . CAD (coronary artery disease)    a. 06/2009 Cath: 3VD, nondominant RCA;  b. 06/2009 CABG x 4: LIMA->LAD, VG->OM1, VG->OM3, VG->D1;  c. 03/2011 ETT: normal;  d. 12/2013 NSTEMI/Cath/PCI: LM nl, LAD 100p, LCX 100p, RCA nl, LIMA->LAD ok, VG->Diag 65m, VG->OM1 99d(3.5x18 Xience DES), VG->OM3 100d, EF 55%.  [name] GERD  (gastroesophageal reflux disease)   . Heart murmur    as a child  . Horseshoe kidney   . HTN (hypertension)    pt denies this  . Hypercholesterolemia   . Osteoarthritis   . PUD (peptic ulcer disease)   . Stones in the urinary tract     Past Surgical History:  Procedure Laterality Date  . CARDIAC CATHETERIZATION  CABG  . CARPAL TUNNEL RELEASE     bil  . COLONOSCOPY     x 2  . CORONARY ARTERY BYPASS GRAFT  2010  . CORONARY ARTERY BYPASS GRAFT N/A 01/27/2015   Procedure: REDO CORONARY ARTERY BYPASS GRAFTING (CABG) x  three , using right internal mammary artery and bilateral radial arteries;  Surgeon: [name], MD;  Location: [place] OR;  Service: Open Heart Surgery;  Laterality: N/A;  . HERNIA REPAIR  2011   rt ing  . KIDNEY STONE SURGERY Left   . KNEE ARTHROSCOPY     rt x2, lft x3  . LEFT HEART CATHETERIZATION WITH CORONARY/GRAFT ANGIOGRAM N/A 12/28/2013   Procedure: LEFT HEART CATHETERIZATION WITH Beatrix Fetters;  Surgeon: Larey Dresser, MD;  Location: [facility] CATH LAB;  Service: Cardiovascular;  Laterality: N/A;  . LEFT HEART CATHETERIZATION WITH CORONARY/GRAFT ANGIOGRAM N/A 09/19/2014   Procedure: LEFT HEART CATHETERIZATION WITH Beatrix Fetters;  Surgeon: Wellington Hampshire, MD;  Location: [place] CATH LAB;  Service: Cardiovascular;  Laterality: N/A;  . OTHER SURGICAL HISTORY     BIL.CARPAL TUNNEL SURG.  . RADIAL ARTERY HARVEST Bilateral  01/27/2015   Procedure: BILATERAL RADIAL ARTERY HARVEST;  Surgeon: [name], MD;  Location: [place];  Service: Open Heart Surgery;  Laterality: Bilateral;  BILATERAL RADIAL HARVEST & RIGHT IMA  . RT.KNEE REPLACEMENT    . TEE WITHOUT CARDIOVERSION N/A 01/27/2015   Procedure: TRANSESOPHAGEAL ECHOCARDIOGRAM (TEE);  Surgeon: [name], MD;  Location: [place];  Service: Open Heart Surgery;  Laterality: N/A;  . TOTAL KNEE ARTHROPLASTY  02/11/2012   Procedure: TOTAL KNEE ARTHROPLASTY;  Surgeon: Rudean Haskell, MD;  Location: [place];  Service:  Orthopedics;  Laterality: Left;  left total knee replacement  . VASECTOMY      Current Outpatient Medications  Medication Sig Dispense Refill  . acetaminophen (TYLENOL) 650 MG CR tablet Take 1,300 mg by mouth every morning.     [name] aspirin EC 81 MG tablet Take 81 mg by mouth daily.    [name] atorvastatin (LIPITOR) 80 MG tablet TAKE 1 TABLET BY MOUTH EVERY DAY 90 tablet 1  . diphenhydramine-acetaminophen (TYLENOL PM EXTRA STRENGTH) 25-500 MG TABS Take 1 tablet by mouth at bedtime as needed. For sleep    . famotidine (PEPCID) 20 MG tablet Take 20 mg by mouth 2 (two) times daily.    . fenofibrate 54 MG tablet Take 1 tablet (54 mg total) by mouth daily. 90 tablet 2  . gabapentin (NEURONTIN) 300 MG capsule Take 300-600 mg by mouth 2 (two) times daily. Take 300 mg in the morning and 600 mg at bedtime     . glucosamine-chondroitin 500-400 MG tablet Take 1 tablet by mouth 2 (two) times daily.     . isosorbide mononitrate (IMDUR) 30 MG 24 hr tablet TAKE 1/2 (ONE-HALF) TABLET BY MOUTH ONCE DAILY 45 tablet 3  . metoprolol tartrate (LOPRESSOR) 25 MG tablet Take 25 mg by mouth 2 (two) times daily.     . Multiple Vitamin (MULTIVITAMIN) tablet Take 1 tablet by mouth daily.      . nitroGLYCERIN (NITROSTAT) 0.4 MG SL tablet Place 1 tablet (0.4 mg total) under the tongue every 5 (five) minutes as needed for chest pain. 25 tablet 4  . omega-3 acid ethyl esters (LOVAZA) 1 g capsule Take 2 g by mouth 2 (two) times daily.    . potassium citrate (UROCIT-K) 5 MEQ (540 MG) SR tablet Take 10 mEq by mouth 2 (two) times daily.     . traMADol (ULTRAM) 50 MG tablet Take 50 mg by mouth as needed.      No current facility-administered medications for this visit.   Allergies:  Patient has no known allergies.   Social History: The patient  reports that he has never smoked. He has never used smokeless tobacco. He reports current alcohol use. He reports that he does not use drugs.   Family History: The patient's family history  includes COPD in his father; Hypertension in his mother.   ROS:  Please see the history of present illness. Otherwise, complete review of systems is positive for none.  All other systems are reviewed and negative.   Physical Exam: VS:  BP 116/68   Pulse (!) 58   Ht 5\' 9"  (1.753 m)   Wt 238 lb 9.6 oz (108.2 kg)   SpO2 97%   BMI 35.24 kg/m , BMI Body mass index is 35.24 kg/m.  Wt Readings from Last 3 Encounters:  04/27/20 238 lb 9.6 oz (108.2 kg)  04/28/19 230 lb (104.3 kg)  12/31/17 235 lb (106.6 kg)    General: Patient appears  comfortable at rest. Neck: Supple, no elevated JVP or carotid bruits, no thyromegaly. Lungs: Clear to auscultation, nonlabored breathing at rest. Cardiac: Regular rate and rhythm, no S3 or significant systolic murmur, no pericardial rub. Extremities: No pitting edema, distal pulses 2+. Skin: Warm and dry. Musculoskeletal: No kyphosis. Neuropsychiatric: Alert and oriented x3, affect grossly appropriate.  ECG:  EKG today shows sinus bradycardia with rate of 56 nonspecific T wave abnormality  Recent Labwork: No results found for requested labs within last 8760 hours.     Component Value Date/Time   CHOL 193 09/19/2014 0345   TRIG 280 (H) 09/19/2014 0345   HDL 36 (L) 09/19/2014 0345   CHOLHDL 5.4 09/19/2014 0345   VLDL 56 (H) 09/19/2014 0345   LDLCALC 101 (H) 09/19/2014 0345    Other Studies Reviewed Today:   Assessment and Plan:  1. CAD in native artery   2. Essential hypertension, benign   3. Mixed hyperlipidemia     1. CAD in native artery Patient denies any progressive anginal or exertional symptoms.  He is compliant with all his medications.  States he takes an occasional nitroglycerin for any type of perceived chest pain just to be safe.  No recent episodes of chest pain.  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  Continue aspirin 81 mg daily, Imdur 30 mg daily,  sublingual nitroglycerin as needed.  2. Essential hypertension, benign Blood pressure well controlled on current therapy.  Continue metoprolol 25 mg p.o. twice daily.  3. Mixed hyperlipidemia States he had lab work done before yesterday at his PCP office.  Results are pending.  Continue atorvastatin 80 mg daily and fenofibrate 54 mg daily.  Patient states he had stopped Zetia previously due to muscle pain.  Medication Adjustments/Labs and Tests Ordered: Current medicines are reviewed at length with the patient today.  Concerns regarding medicines are outlined above.   Disposition: Follow-up with Dr. Bronson Ing or APP 1 year  Signed, Levell July, NP 04/27/2020 9:54 AM    [facility] [place], [place], [place] [address] Phone: [phone]; Fax: [phone]

## 2020-04-27 ENCOUNTER — Encounter: Payer: Self-pay | Admitting: Family Medicine

## 2020-04-27 ENCOUNTER — Ambulatory Visit (INDEPENDENT_AMBULATORY_CARE_PROVIDER_SITE_OTHER): Payer: Medicare Other | Admitting: Family Medicine

## 2020-04-27 VITALS — BP 116/68 | HR 58 | Ht 69.0 in | Wt 238.6 lb

## 2020-04-27 DIAGNOSIS — I2510 Atherosclerotic heart disease of native coronary artery without angina pectoris: Secondary | ICD-10-CM

## 2020-04-27 DIAGNOSIS — I10 Essential (primary) hypertension: Secondary | ICD-10-CM

## 2020-04-27 DIAGNOSIS — E782 Mixed hyperlipidemia: Secondary | ICD-10-CM

## 2020-04-27 NOTE — Patient Instructions (Addendum)

## 2020-05-03 DIAGNOSIS — E782 Mixed hyperlipidemia: Secondary | ICD-10-CM | POA: Diagnosis not present

## 2020-05-03 DIAGNOSIS — R4582 Worries: Secondary | ICD-10-CM | POA: Diagnosis not present

## 2020-05-03 DIAGNOSIS — I10 Essential (primary) hypertension: Secondary | ICD-10-CM | POA: Diagnosis not present

## 2020-05-03 DIAGNOSIS — I714 Abdominal aortic aneurysm, without rupture: Secondary | ICD-10-CM | POA: Diagnosis not present

## 2020-05-03 DIAGNOSIS — E1122 Type 2 diabetes mellitus with diabetic chronic kidney disease: Secondary | ICD-10-CM | POA: Diagnosis not present

## 2020-05-03 DIAGNOSIS — K219 Gastro-esophageal reflux disease without esophagitis: Secondary | ICD-10-CM | POA: Diagnosis not present

## 2020-05-03 DIAGNOSIS — I25709 Atherosclerosis of coronary artery bypass graft(s), unspecified, with unspecified angina pectoris: Secondary | ICD-10-CM | POA: Diagnosis not present

## 2020-05-03 DIAGNOSIS — Z6836 Body mass index (BMI) 36.0-36.9, adult: Secondary | ICD-10-CM | POA: Diagnosis not present

## 2020-05-17 DIAGNOSIS — M722 Plantar fascial fibromatosis: Secondary | ICD-10-CM | POA: Diagnosis not present

## 2020-05-17 DIAGNOSIS — M79672 Pain in left foot: Secondary | ICD-10-CM | POA: Diagnosis not present

## 2020-05-17 DIAGNOSIS — M7732 Calcaneal spur, left foot: Secondary | ICD-10-CM | POA: Diagnosis not present

## 2020-05-19 DIAGNOSIS — R3129 Other microscopic hematuria: Secondary | ICD-10-CM | POA: Diagnosis not present

## 2020-05-19 DIAGNOSIS — Q631 Lobulated, fused and horseshoe kidney: Secondary | ICD-10-CM | POA: Diagnosis not present

## 2020-05-19 DIAGNOSIS — N400 Enlarged prostate without lower urinary tract symptoms: Secondary | ICD-10-CM | POA: Diagnosis not present

## 2020-05-19 DIAGNOSIS — N200 Calculus of kidney: Secondary | ICD-10-CM | POA: Diagnosis not present

## 2020-06-21 DIAGNOSIS — I129 Hypertensive chronic kidney disease with stage 1 through stage 4 chronic kidney disease, or unspecified chronic kidney disease: Secondary | ICD-10-CM | POA: Diagnosis not present

## 2020-06-21 DIAGNOSIS — E1122 Type 2 diabetes mellitus with diabetic chronic kidney disease: Secondary | ICD-10-CM | POA: Diagnosis not present

## 2020-06-21 DIAGNOSIS — E7849 Other hyperlipidemia: Secondary | ICD-10-CM | POA: Diagnosis not present

## 2020-06-21 DIAGNOSIS — N183 Chronic kidney disease, stage 3 unspecified: Secondary | ICD-10-CM | POA: Diagnosis not present

## 2020-06-22 ENCOUNTER — Other Ambulatory Visit: Payer: Self-pay | Admitting: *Deleted

## 2020-06-22 MED ORDER — ATORVASTATIN CALCIUM 80 MG PO TABS: 80.0000 mg | ORAL_TABLET | Freq: Every day | ORAL | 3 refills | Status: DC

## 2020-07-05 DIAGNOSIS — M79672 Pain in left foot: Secondary | ICD-10-CM | POA: Diagnosis not present

## 2020-07-05 DIAGNOSIS — M722 Plantar fascial fibromatosis: Secondary | ICD-10-CM | POA: Diagnosis not present

## 2020-07-05 DIAGNOSIS — M7732 Calcaneal spur, left foot: Secondary | ICD-10-CM | POA: Diagnosis not present

## 2020-07-21 DIAGNOSIS — E7849 Other hyperlipidemia: Secondary | ICD-10-CM | POA: Diagnosis not present

## 2020-07-21 DIAGNOSIS — E1122 Type 2 diabetes mellitus with diabetic chronic kidney disease: Secondary | ICD-10-CM | POA: Diagnosis not present

## 2020-07-21 DIAGNOSIS — I129 Hypertensive chronic kidney disease with stage 1 through stage 4 chronic kidney disease, or unspecified chronic kidney disease: Secondary | ICD-10-CM | POA: Diagnosis not present

## 2020-07-21 DIAGNOSIS — N183 Chronic kidney disease, stage 3 unspecified: Secondary | ICD-10-CM | POA: Diagnosis not present

## 2020-08-02 DIAGNOSIS — M79672 Pain in left foot: Secondary | ICD-10-CM | POA: Diagnosis not present

## 2020-08-02 DIAGNOSIS — M7732 Calcaneal spur, left foot: Secondary | ICD-10-CM | POA: Diagnosis not present

## 2020-08-02 DIAGNOSIS — M722 Plantar fascial fibromatosis: Secondary | ICD-10-CM | POA: Diagnosis not present

## 2020-08-30 DIAGNOSIS — M79672 Pain in left foot: Secondary | ICD-10-CM | POA: Diagnosis not present

## 2020-08-30 DIAGNOSIS — M722 Plantar fascial fibromatosis: Secondary | ICD-10-CM | POA: Diagnosis not present

## 2020-08-30 DIAGNOSIS — M7732 Calcaneal spur, left foot: Secondary | ICD-10-CM | POA: Diagnosis not present

## 2020-09-20 DIAGNOSIS — I129 Hypertensive chronic kidney disease with stage 1 through stage 4 chronic kidney disease, or unspecified chronic kidney disease: Secondary | ICD-10-CM | POA: Diagnosis not present

## 2020-09-20 DIAGNOSIS — E1122 Type 2 diabetes mellitus with diabetic chronic kidney disease: Secondary | ICD-10-CM | POA: Diagnosis not present

## 2020-09-20 DIAGNOSIS — N183 Chronic kidney disease, stage 3 unspecified: Secondary | ICD-10-CM | POA: Diagnosis not present

## 2020-09-22 DIAGNOSIS — R030 Elevated blood-pressure reading, without diagnosis of hypertension: Secondary | ICD-10-CM | POA: Diagnosis not present

## 2020-09-22 DIAGNOSIS — H6122 Impacted cerumen, left ear: Secondary | ICD-10-CM | POA: Diagnosis not present

## 2020-09-28 DIAGNOSIS — Z23 Encounter for immunization: Secondary | ICD-10-CM | POA: Diagnosis not present

## 2020-10-01 DIAGNOSIS — R319 Hematuria, unspecified: Secondary | ICD-10-CM | POA: Diagnosis not present

## 2020-10-01 DIAGNOSIS — M549 Dorsalgia, unspecified: Secondary | ICD-10-CM | POA: Diagnosis not present

## 2020-10-01 DIAGNOSIS — N390 Urinary tract infection, site not specified: Secondary | ICD-10-CM | POA: Diagnosis not present

## 2020-10-01 DIAGNOSIS — R8279 Other abnormal findings on microbiological examination of urine: Secondary | ICD-10-CM | POA: Diagnosis not present

## 2020-10-18 DIAGNOSIS — M5416 Radiculopathy, lumbar region: Secondary | ICD-10-CM | POA: Diagnosis not present

## 2020-11-14 DIAGNOSIS — E7849 Other hyperlipidemia: Secondary | ICD-10-CM | POA: Diagnosis not present

## 2020-11-14 DIAGNOSIS — Z0001 Encounter for general adult medical examination with abnormal findings: Secondary | ICD-10-CM | POA: Diagnosis not present

## 2020-11-14 DIAGNOSIS — R946 Abnormal results of thyroid function studies: Secondary | ICD-10-CM | POA: Diagnosis not present

## 2020-11-14 DIAGNOSIS — E1122 Type 2 diabetes mellitus with diabetic chronic kidney disease: Secondary | ICD-10-CM | POA: Diagnosis not present

## 2020-11-14 DIAGNOSIS — E782 Mixed hyperlipidemia: Secondary | ICD-10-CM | POA: Diagnosis not present

## 2020-11-14 DIAGNOSIS — N183 Chronic kidney disease, stage 3 unspecified: Secondary | ICD-10-CM | POA: Diagnosis not present

## 2020-11-14 DIAGNOSIS — I10 Essential (primary) hypertension: Secondary | ICD-10-CM | POA: Diagnosis not present

## 2020-11-14 DIAGNOSIS — K2100 Gastro-esophageal reflux disease with esophagitis, without bleeding: Secondary | ICD-10-CM | POA: Diagnosis not present

## 2020-11-14 DIAGNOSIS — N400 Enlarged prostate without lower urinary tract symptoms: Secondary | ICD-10-CM | POA: Diagnosis not present

## 2020-11-17 DIAGNOSIS — R4582 Worries: Secondary | ICD-10-CM | POA: Diagnosis not present

## 2020-11-17 DIAGNOSIS — E7849 Other hyperlipidemia: Secondary | ICD-10-CM | POA: Diagnosis not present

## 2020-11-17 DIAGNOSIS — I25709 Atherosclerosis of coronary artery bypass graft(s), unspecified, with unspecified angina pectoris: Secondary | ICD-10-CM | POA: Diagnosis not present

## 2020-11-17 DIAGNOSIS — Z23 Encounter for immunization: Secondary | ICD-10-CM | POA: Diagnosis not present

## 2020-11-17 DIAGNOSIS — E1122 Type 2 diabetes mellitus with diabetic chronic kidney disease: Secondary | ICD-10-CM | POA: Diagnosis not present

## 2020-11-17 DIAGNOSIS — K2100 Gastro-esophageal reflux disease with esophagitis, without bleeding: Secondary | ICD-10-CM | POA: Diagnosis not present

## 2020-11-17 DIAGNOSIS — I714 Abdominal aortic aneurysm, without rupture: Secondary | ICD-10-CM | POA: Diagnosis not present

## 2020-11-17 DIAGNOSIS — I10 Essential (primary) hypertension: Secondary | ICD-10-CM | POA: Diagnosis not present

## 2020-11-23 DIAGNOSIS — R3129 Other microscopic hematuria: Secondary | ICD-10-CM | POA: Diagnosis not present

## 2020-11-23 DIAGNOSIS — N200 Calculus of kidney: Secondary | ICD-10-CM | POA: Diagnosis not present

## 2020-11-23 DIAGNOSIS — Q631 Lobulated, fused and horseshoe kidney: Secondary | ICD-10-CM | POA: Diagnosis not present

## 2020-11-23 DIAGNOSIS — N400 Enlarged prostate without lower urinary tract symptoms: Secondary | ICD-10-CM | POA: Diagnosis not present

## 2020-12-19 DIAGNOSIS — E1122 Type 2 diabetes mellitus with diabetic chronic kidney disease: Secondary | ICD-10-CM | POA: Diagnosis not present

## 2020-12-19 DIAGNOSIS — E7849 Other hyperlipidemia: Secondary | ICD-10-CM | POA: Diagnosis not present

## 2020-12-19 DIAGNOSIS — N183 Chronic kidney disease, stage 3 unspecified: Secondary | ICD-10-CM | POA: Diagnosis not present

## 2020-12-19 DIAGNOSIS — I129 Hypertensive chronic kidney disease with stage 1 through stage 4 chronic kidney disease, or unspecified chronic kidney disease: Secondary | ICD-10-CM | POA: Diagnosis not present

## 2021-01-18 DIAGNOSIS — I129 Hypertensive chronic kidney disease with stage 1 through stage 4 chronic kidney disease, or unspecified chronic kidney disease: Secondary | ICD-10-CM | POA: Diagnosis not present

## 2021-01-18 DIAGNOSIS — E7849 Other hyperlipidemia: Secondary | ICD-10-CM | POA: Diagnosis not present

## 2021-01-18 DIAGNOSIS — N183 Chronic kidney disease, stage 3 unspecified: Secondary | ICD-10-CM | POA: Diagnosis not present

## 2021-01-18 DIAGNOSIS — E1122 Type 2 diabetes mellitus with diabetic chronic kidney disease: Secondary | ICD-10-CM | POA: Diagnosis not present

## 2021-01-27 DIAGNOSIS — M6530 Trigger finger, unspecified finger: Secondary | ICD-10-CM | POA: Diagnosis not present

## 2021-01-27 DIAGNOSIS — M13841 Other specified arthritis, right hand: Secondary | ICD-10-CM | POA: Diagnosis not present

## 2021-01-27 DIAGNOSIS — M65312 Trigger thumb, left thumb: Secondary | ICD-10-CM | POA: Diagnosis not present

## 2021-02-18 DIAGNOSIS — N183 Chronic kidney disease, stage 3 unspecified: Secondary | ICD-10-CM | POA: Diagnosis not present

## 2021-02-18 DIAGNOSIS — E7849 Other hyperlipidemia: Secondary | ICD-10-CM | POA: Diagnosis not present

## 2021-02-18 DIAGNOSIS — I129 Hypertensive chronic kidney disease with stage 1 through stage 4 chronic kidney disease, or unspecified chronic kidney disease: Secondary | ICD-10-CM | POA: Diagnosis not present

## 2021-02-18 DIAGNOSIS — E1122 Type 2 diabetes mellitus with diabetic chronic kidney disease: Secondary | ICD-10-CM | POA: Diagnosis not present

## 2021-02-21 DIAGNOSIS — M25512 Pain in left shoulder: Secondary | ICD-10-CM | POA: Diagnosis not present

## 2021-03-01 ENCOUNTER — Other Ambulatory Visit: Payer: Self-pay | Admitting: *Deleted

## 2021-03-01 MED ORDER — ISOSORBIDE MONONITRATE ER 30 MG PO TB24: 15.0000 mg | ORAL_TABLET | Freq: Every day | ORAL | 0 refills | Status: DC

## 2021-04-20 DIAGNOSIS — N183 Chronic kidney disease, stage 3 unspecified: Secondary | ICD-10-CM | POA: Diagnosis not present

## 2021-04-20 DIAGNOSIS — I129 Hypertensive chronic kidney disease with stage 1 through stage 4 chronic kidney disease, or unspecified chronic kidney disease: Secondary | ICD-10-CM | POA: Diagnosis not present

## 2021-04-20 DIAGNOSIS — E7849 Other hyperlipidemia: Secondary | ICD-10-CM | POA: Diagnosis not present

## 2021-04-20 DIAGNOSIS — E1122 Type 2 diabetes mellitus with diabetic chronic kidney disease: Secondary | ICD-10-CM | POA: Diagnosis not present

## 2021-04-26 NOTE — Progress Notes (Signed)
Cardiology Office Note  Date: 04/27/2021   ID: Ricardo Jimenez, DOB 12-07-1950, MRN [id number]  PCP:  Loa Socks, MD  Cardiologist:  Kate Sable, MD (Inactive) Electrophysiologist:  None   Chief Complaint: 1 year follow-up CAD  History of Present Illness: Ricardo Jimenez is a 70 y.o. male with a history of CAD with history of redo coronary artery bypass grafting x3 02/16/2015 and with a free RIMA to diagonal, right radial artery graft to OM 3, and left radial artery graft to OM1.  Prior to that he underwent a four-vessel CABG in 2010.  He is here today for 1 year follow-up.  He denies any recent acute illnesses or hospitalizations in the interim since last visit he still active playing golf around 3 times a week and bowls twice a week on a league.  He denies any recent anginal or exertional symptoms, orthostatic symptoms, CVA or TIA-like symptoms, PND or orthopnea.  Denies any palpitations or arrhythmias.  No claudication-like symptoms, DVT or PE-like symptoms, or lower extremity edema.  Current cardiac regimen includes aspirin 81 mg daily, atorvastatin 80 mg daily, fenofibrate 54 mg daily, Imdur 15 mg daily, metoprolol 25 mg p.o. twice daily.  Sublingual nitroglycerin as needed.   Past Medical History:  Diagnosis Date   CAD (coronary artery disease)    a. 06/2009 Cath: 3VD, nondominant RCA;  b. 06/2009 CABG x 4: LIMA->LAD, VG->OM1, VG->OM3, VG->D1;  c. 03/2011 ETT: normal;  d. 12/2013 NSTEMI/Cath/PCI: LM nl, LAD 100p, LCX 100p, RCA nl, LIMA->LAD ok, VG->Diag 59m, VG->OM1 99d(3.5x18 Xience DES), VG->OM3 100d, EF 55%.   GERD (gastroesophageal reflux disease)    Heart murmur    as a child   Horseshoe kidney    HTN (hypertension)    pt denies this   Hypercholesterolemia    Osteoarthritis    PUD (peptic ulcer disease)    Stones in the urinary tract     Past Surgical History:  Procedure Laterality Date   CARDIAC CATHETERIZATION  CABG   CARPAL TUNNEL RELEASE     bil    COLONOSCOPY     x 2   CORONARY ARTERY BYPASS GRAFT  2010   CORONARY ARTERY BYPASS GRAFT N/A 01/27/2015   Procedure: REDO CORONARY ARTERY BYPASS GRAFTING (CABG) x  three , using right internal mammary artery and bilateral radial arteries;  Surgeon: [name], MD;  Location: [facility];  Service: Open Heart Surgery;  Laterality: N/A;   HERNIA REPAIR  2011   rt ing   KIDNEY STONE SURGERY Left    KNEE ARTHROSCOPY     rt x2, lft x3   LEFT HEART CATHETERIZATION WITH CORONARY/GRAFT ANGIOGRAM N/A 12/28/2013   Procedure: LEFT HEART CATHETERIZATION WITH Beatrix Fetters;  Surgeon: Larey Dresser, MD;  Location: [facility] CATH LAB;  Service: Cardiovascular;  Laterality: N/A;   LEFT HEART CATHETERIZATION WITH CORONARY/GRAFT ANGIOGRAM N/A 09/19/2014   Procedure: LEFT HEART CATHETERIZATION WITH Beatrix Fetters;  Surgeon: Wellington Hampshire, MD;  Location: [facility] CATH LAB;  Service: Cardiovascular;  Laterality: N/A;   OTHER SURGICAL HISTORY     BIL.CARPAL TUNNEL SURG.   RADIAL ARTERY HARVEST Bilateral 01/27/2015   Procedure: BILATERAL RADIAL ARTERY HARVEST;  Surgeon: [name], MD;  Location: [facility];  Service: Open Heart Surgery;  Laterality: Bilateral;  BILATERAL RADIAL HARVEST & RIGHT IMA   RT.KNEE REPLACEMENT     TEE WITHOUT CARDIOVERSION N/A 01/27/2015   Procedure: TRANSESOPHAGEAL ECHOCARDIOGRAM (TEE);  Surgeon: [name], MD;  Location: [facility] OR;  Service: Open Heart Surgery;  Laterality: N/A;   TOTAL KNEE ARTHROPLASTY  02/11/2012   Procedure: TOTAL KNEE ARTHROPLASTY;  Surgeon: Rudean Haskell, MD;  Location: [facility];  Service: Orthopedics;  Laterality: Left;  left total knee replacement   VASECTOMY      Current Outpatient Medications  Medication Sig Dispense Refill   acetaminophen (TYLENOL) 650 MG CR tablet Take 1,300 mg by mouth every morning.      aspirin EC 81 MG tablet Take 81 mg by mouth daily.     atorvastatin (LIPITOR) 80 MG tablet Take 1 tablet (80 mg total) by mouth daily. 90 tablet 3    diphenhydramine-acetaminophen (TYLENOL PM) 25-500 MG TABS tablet Take 1 tablet by mouth at bedtime as needed. For sleep     famotidine (PEPCID) 20 MG tablet Take 20 mg by mouth 2 (two) times daily.     fenofibrate 54 MG tablet Take 1 tablet (54 mg total) by mouth daily. 90 tablet 2   gabapentin (NEURONTIN) 300 MG capsule Take 300-600 mg by mouth 2 (two) times daily. Take 300 mg in the morning and 600 mg at bedtime      glucosamine-chondroitin 500-400 MG tablet Take 1 tablet by mouth 2 (two) times daily.      ibuprofen (ADVIL) 200 MG tablet Take 200 mg by mouth every 6 (six) hours as needed.     isosorbide mononitrate (IMDUR) 30 MG 24 hr tablet Take 0.5 tablets (15 mg total) by mouth daily. 45 tablet 0   metoprolol tartrate (LOPRESSOR) 25 MG tablet Take 25 mg by mouth 2 (two) times daily.      Multiple Vitamin (MULTIVITAMIN) tablet Take 1 tablet by mouth daily.       nitroGLYCERIN (NITROSTAT) 0.4 MG SL tablet Place 1 tablet (0.4 mg total) under the tongue every 5 (five) minutes as needed for chest pain. 25 tablet 4   omega-3 acid ethyl esters (LOVAZA) 1 g capsule Take 2 g by mouth 2 (two) times daily.     potassium citrate (UROCIT-K) 5 MEQ (540 MG) SR tablet Take 10 mEq by mouth 2 (two) times daily.      traMADol (ULTRAM) 50 MG tablet Take 50 mg by mouth as needed.      No current facility-administered medications for this visit.   Allergies:  Patient has no known allergies.   Social History: The patient  reports that he has never smoked. He has never used smokeless tobacco. He reports current alcohol use. He reports that he does not use drugs.   Family History: The patient's family history includes COPD in his father; Hypertension in his mother.   ROS:  Please see the history of present illness. Otherwise, complete review of systems is positive for none.  All other systems are reviewed and negative.   Physical Exam: VS:  BP 120/70   Pulse (!) 50   Ht 5\' 9"  (1.753 m)   Wt 239 lb (108.4  kg)   SpO2 98%   BMI 35.29 kg/m , BMI Body mass index is 35.29 kg/m.  Wt Readings from Last 3 Encounters:  04/27/21 239 lb (108.4 kg)  04/27/20 238 lb 9.6 oz (108.2 kg)  04/28/19 230 lb (104.3 kg)    General: Obese patient appears comfortable at rest. Neck: Supple, no elevated JVP or carotid bruits, no thyromegaly. Lungs: Clear to auscultation, nonlabored breathing at rest. Cardiac: Regular rate and rhythm, no S3 or significant systolic murmur, no pericardial rub. Extremities: No pitting  edema, distal pulses 2+. Skin: Warm and dry. Musculoskeletal: No kyphosis. Neuropsychiatric: Alert and oriented x3, affect grossly appropriate.  ECG: April 27, 2021 EKG shows sinus bradycardia rate of 57, RBBB, LAFB, possible inferior infarct, age undetermined.  Recent Labwork: No results found for requested labs within last 8760 hours.     Component Value Date/Time   CHOL 193 09/19/2014 0345   TRIG 280 (H) 09/19/2014 0345   HDL 36 (L) 09/19/2014 0345   CHOLHDL 5.4 09/19/2014 0345   VLDL 56 (H) 09/19/2014 0345   LDLCALC 101 (H) 09/19/2014 0345    Other Studies Reviewed Today:   Assessment and Plan:  1. CAD in native artery   2. Essential hypertension, benign   3. Mixed hyperlipidemia      1. CAD in native artery No recent anginal or exertional symptoms.  He is compliant with all his medications.  States he takes an occasional nitroglycerin for any type of perceived chest pain just to be safe.   Continue aspirin 81 mg daily.  Continue Imdur 15 mg p.o. daily.  Continue sublingual nitroglycerin as needed.  EKG today shows sinus bradycardia rate of 57, RBBB and LAFB.  This was not present on previous EKG.  He denies any recent issues related to his heart.  He denies any palpitations or arrhythmias.  We will continue to monitor.   2. Essential hypertension, benign Blood pressure well controlled on current therapy.  BP today 120/70.  Continue metoprolol 25 mg p.o. twice daily.  3. Mixed  hyperlipidemia Continue atorvastatin 80 mg daily and fenofibrate 54 mg daily.  He previously stopped Zetia previously due to muscle pain.  Medication Adjustments/Labs and Tests Ordered: Current medicines are reviewed at length with the patient today.  Concerns regarding medicines are outlined above.   Disposition: Follow-up with Dr. Harl Bowie or APP 1 year  Signed, Levell July, NP 04/27/2021 3:47 PM    [facility] at [place], [place], [place] [address] Phone: [phone]; Fax: [phone]

## 2021-04-27 ENCOUNTER — Ambulatory Visit (INDEPENDENT_AMBULATORY_CARE_PROVIDER_SITE_OTHER): Payer: Medicare Other | Admitting: Family Medicine

## 2021-04-27 ENCOUNTER — Encounter: Payer: Self-pay | Admitting: Family Medicine

## 2021-04-27 VITALS — BP 120/70 | HR 58 | Ht 69.0 in | Wt 239.0 lb

## 2021-04-27 DIAGNOSIS — E782 Mixed hyperlipidemia: Secondary | ICD-10-CM | POA: Diagnosis not present

## 2021-04-27 DIAGNOSIS — I10 Essential (primary) hypertension: Secondary | ICD-10-CM | POA: Diagnosis not present

## 2021-04-27 DIAGNOSIS — I2510 Atherosclerotic heart disease of native coronary artery without angina pectoris: Secondary | ICD-10-CM | POA: Diagnosis not present

## 2021-04-27 NOTE — Patient Instructions (Signed)
Medication Instructions:  Your physician recommends that you continue on your current medications as directed. Please refer to the Current Medication list given to you today.  *If you need a refill on your cardiac medications before your next appointment, please call your pharmacy*   Lab Work: None If you have labs (blood work) drawn today and your tests are completely normal, you will receive your results only by: [place] (if you have MyChart) OR A paper copy in the mail If you have any lab test that is abnormal or we need to change your treatment, we will call you to review the results.   Testing/Procedures: None   Follow-Up: At [facility], you and your health needs are our priority.  As part of our continuing mission to provide you with exceptional heart care, we have created designated Provider Care Teams.  These Care Teams include your primary Cardiologist (physician) and Advanced Practice Providers (APPs -  Physician Assistants and Nurse Practitioners) who all work together to provide you with the care you need, when you need it.  We recommend signing up for the patient portal called "MyChart".  Sign up information is provided on this After Visit Summary.  MyChart is used to connect with patients for Virtual Visits (Telemedicine).  Patients are able to view lab/test results, encounter notes, upcoming appointments, etc.  Non-urgent messages can be sent to your provider as well.   To learn more about what you can do with MyChart, go to [email or web address].    Your next appointment:   12 month(s)  The format for your next appointment:   In Person  Provider:   Delorse Lek., NP   Other Instructions

## 2021-05-11 DIAGNOSIS — E1122 Type 2 diabetes mellitus with diabetic chronic kidney disease: Secondary | ICD-10-CM | POA: Diagnosis not present

## 2021-05-11 DIAGNOSIS — I10 Essential (primary) hypertension: Secondary | ICD-10-CM | POA: Diagnosis not present

## 2021-05-11 DIAGNOSIS — E7801 Familial hypercholesterolemia: Secondary | ICD-10-CM | POA: Diagnosis not present

## 2021-05-11 DIAGNOSIS — N183 Chronic kidney disease, stage 3 unspecified: Secondary | ICD-10-CM | POA: Diagnosis not present

## 2021-05-11 DIAGNOSIS — Z125 Encounter for screening for malignant neoplasm of prostate: Secondary | ICD-10-CM | POA: Diagnosis not present

## 2021-05-11 DIAGNOSIS — E7849 Other hyperlipidemia: Secondary | ICD-10-CM | POA: Diagnosis not present

## 2021-05-11 DIAGNOSIS — R946 Abnormal results of thyroid function studies: Secondary | ICD-10-CM | POA: Diagnosis not present

## 2021-05-11 DIAGNOSIS — K2100 Gastro-esophageal reflux disease with esophagitis, without bleeding: Secondary | ICD-10-CM | POA: Diagnosis not present

## 2021-05-11 DIAGNOSIS — N1831 Chronic kidney disease, stage 3a: Secondary | ICD-10-CM | POA: Diagnosis not present

## 2021-05-11 DIAGNOSIS — E782 Mixed hyperlipidemia: Secondary | ICD-10-CM | POA: Diagnosis not present

## 2021-05-11 DIAGNOSIS — Z1159 Encounter for screening for other viral diseases: Secondary | ICD-10-CM | POA: Diagnosis not present

## 2021-05-11 DIAGNOSIS — E7800 Pure hypercholesterolemia, unspecified: Secondary | ICD-10-CM | POA: Diagnosis not present

## 2021-05-16 DIAGNOSIS — Z0001 Encounter for general adult medical examination with abnormal findings: Secondary | ICD-10-CM | POA: Diagnosis not present

## 2021-05-16 DIAGNOSIS — I25709 Atherosclerosis of coronary artery bypass graft(s), unspecified, with unspecified angina pectoris: Secondary | ICD-10-CM | POA: Diagnosis not present

## 2021-05-16 DIAGNOSIS — G6289 Other specified polyneuropathies: Secondary | ICD-10-CM | POA: Diagnosis not present

## 2021-05-16 DIAGNOSIS — R4582 Worries: Secondary | ICD-10-CM | POA: Diagnosis not present

## 2021-05-16 DIAGNOSIS — E7849 Other hyperlipidemia: Secondary | ICD-10-CM | POA: Diagnosis not present

## 2021-05-16 DIAGNOSIS — I714 Abdominal aortic aneurysm, without rupture: Secondary | ICD-10-CM | POA: Diagnosis not present

## 2021-05-16 DIAGNOSIS — E1122 Type 2 diabetes mellitus with diabetic chronic kidney disease: Secondary | ICD-10-CM | POA: Diagnosis not present

## 2021-05-16 DIAGNOSIS — I10 Essential (primary) hypertension: Secondary | ICD-10-CM | POA: Diagnosis not present

## 2021-05-23 DIAGNOSIS — Z1212 Encounter for screening for malignant neoplasm of rectum: Secondary | ICD-10-CM | POA: Diagnosis not present

## 2021-05-23 DIAGNOSIS — Z1211 Encounter for screening for malignant neoplasm of colon: Secondary | ICD-10-CM | POA: Diagnosis not present

## 2021-05-24 DIAGNOSIS — Q631 Lobulated, fused and horseshoe kidney: Secondary | ICD-10-CM | POA: Diagnosis not present

## 2021-05-24 DIAGNOSIS — N400 Enlarged prostate without lower urinary tract symptoms: Secondary | ICD-10-CM | POA: Diagnosis not present

## 2021-05-24 DIAGNOSIS — N200 Calculus of kidney: Secondary | ICD-10-CM | POA: Diagnosis not present

## 2021-05-24 DIAGNOSIS — R3129 Other microscopic hematuria: Secondary | ICD-10-CM | POA: Diagnosis not present

## 2021-05-25 DIAGNOSIS — M7732 Calcaneal spur, left foot: Secondary | ICD-10-CM | POA: Diagnosis not present

## 2021-05-25 DIAGNOSIS — M722 Plantar fascial fibromatosis: Secondary | ICD-10-CM | POA: Diagnosis not present

## 2021-05-25 DIAGNOSIS — M79672 Pain in left foot: Secondary | ICD-10-CM | POA: Diagnosis not present

## 2021-05-28 ENCOUNTER — Other Ambulatory Visit: Payer: Self-pay | Admitting: Family Medicine

## 2021-05-31 ENCOUNTER — Telehealth: Payer: Self-pay | Admitting: Family Medicine

## 2021-05-31 NOTE — Telephone Encounter (Signed)
Received call from wife Benjamine Mola) stating that on Monday afternoon 05-29-2021 patient started having pain in his left neck,& shoulder radiating into the back of his left arm. States that today around 1130 am he took a NITRO. This made the pain go away. With history of heart disease patient's wife is wanting to know if patient needs to go to the ER or have office visit. [phone].

## 2021-05-31 NOTE — Telephone Encounter (Signed)
Spoke to pt's wife who stated pt denies SOB, CP, nausea. Pt c/o pain radiating down his neck into his shoulder. Advised pt's wife that an ED evaluation may be the best course of action. Pt's spouse verbalized understanding and agreement.

## 2021-06-01 LAB — COLOGUARD: COLOGUARD: NEGATIVE

## 2021-06-09 ENCOUNTER — Other Ambulatory Visit: Payer: Self-pay | Admitting: Family Medicine

## 2021-06-21 DIAGNOSIS — M5416 Radiculopathy, lumbar region: Secondary | ICD-10-CM | POA: Diagnosis not present

## 2021-06-21 DIAGNOSIS — I129 Hypertensive chronic kidney disease with stage 1 through stage 4 chronic kidney disease, or unspecified chronic kidney disease: Secondary | ICD-10-CM | POA: Diagnosis not present

## 2021-06-21 DIAGNOSIS — E7849 Other hyperlipidemia: Secondary | ICD-10-CM | POA: Diagnosis not present

## 2021-06-21 DIAGNOSIS — E1122 Type 2 diabetes mellitus with diabetic chronic kidney disease: Secondary | ICD-10-CM | POA: Diagnosis not present

## 2021-06-21 DIAGNOSIS — N183 Chronic kidney disease, stage 3 unspecified: Secondary | ICD-10-CM | POA: Diagnosis not present

## 2021-06-27 DIAGNOSIS — M7732 Calcaneal spur, left foot: Secondary | ICD-10-CM | POA: Diagnosis not present

## 2021-06-27 DIAGNOSIS — M722 Plantar fascial fibromatosis: Secondary | ICD-10-CM | POA: Diagnosis not present

## 2021-06-27 DIAGNOSIS — M79672 Pain in left foot: Secondary | ICD-10-CM | POA: Diagnosis not present

## 2021-06-28 DIAGNOSIS — H524 Presbyopia: Secondary | ICD-10-CM | POA: Diagnosis not present

## 2021-06-28 DIAGNOSIS — H35033 Hypertensive retinopathy, bilateral: Secondary | ICD-10-CM | POA: Diagnosis not present

## 2021-07-27 DIAGNOSIS — M5416 Radiculopathy, lumbar region: Secondary | ICD-10-CM | POA: Diagnosis not present

## 2021-08-03 DIAGNOSIS — H02411 Mechanical ptosis of right eyelid: Secondary | ICD-10-CM | POA: Diagnosis not present

## 2021-08-03 DIAGNOSIS — H02422 Myogenic ptosis of left eyelid: Secondary | ICD-10-CM | POA: Diagnosis not present

## 2021-08-03 DIAGNOSIS — H02423 Myogenic ptosis of bilateral eyelids: Secondary | ICD-10-CM | POA: Diagnosis not present

## 2021-08-03 DIAGNOSIS — H02831 Dermatochalasis of right upper eyelid: Secondary | ICD-10-CM | POA: Diagnosis not present

## 2021-08-03 DIAGNOSIS — H0279 Other degenerative disorders of eyelid and periocular area: Secondary | ICD-10-CM | POA: Diagnosis not present

## 2021-08-03 DIAGNOSIS — H53483 Generalized contraction of visual field, bilateral: Secondary | ICD-10-CM | POA: Diagnosis not present

## 2021-08-03 DIAGNOSIS — H02413 Mechanical ptosis of bilateral eyelids: Secondary | ICD-10-CM | POA: Diagnosis not present

## 2021-08-03 DIAGNOSIS — H57813 Brow ptosis, bilateral: Secondary | ICD-10-CM | POA: Diagnosis not present

## 2021-08-03 DIAGNOSIS — H02412 Mechanical ptosis of left eyelid: Secondary | ICD-10-CM | POA: Diagnosis not present

## 2021-08-03 DIAGNOSIS — H02834 Dermatochalasis of left upper eyelid: Secondary | ICD-10-CM | POA: Diagnosis not present

## 2021-08-03 DIAGNOSIS — H02421 Myogenic ptosis of right eyelid: Secondary | ICD-10-CM | POA: Diagnosis not present

## 2021-08-15 DIAGNOSIS — H53481 Generalized contraction of visual field, right eye: Secondary | ICD-10-CM | POA: Diagnosis not present

## 2021-08-15 DIAGNOSIS — H53483 Generalized contraction of visual field, bilateral: Secondary | ICD-10-CM | POA: Diagnosis not present

## 2021-08-15 DIAGNOSIS — H53482 Generalized contraction of visual field, left eye: Secondary | ICD-10-CM | POA: Diagnosis not present

## 2021-09-08 DIAGNOSIS — K219 Gastro-esophageal reflux disease without esophagitis: Secondary | ICD-10-CM | POA: Diagnosis not present

## 2021-09-08 DIAGNOSIS — E7849 Other hyperlipidemia: Secondary | ICD-10-CM | POA: Diagnosis not present

## 2021-09-08 DIAGNOSIS — E782 Mixed hyperlipidemia: Secondary | ICD-10-CM | POA: Diagnosis not present

## 2021-09-08 DIAGNOSIS — N1831 Chronic kidney disease, stage 3a: Secondary | ICD-10-CM | POA: Diagnosis not present

## 2021-09-08 DIAGNOSIS — I10 Essential (primary) hypertension: Secondary | ICD-10-CM | POA: Diagnosis not present

## 2021-09-08 DIAGNOSIS — E1122 Type 2 diabetes mellitus with diabetic chronic kidney disease: Secondary | ICD-10-CM | POA: Diagnosis not present

## 2021-09-13 ENCOUNTER — Telehealth: Payer: Self-pay | Admitting: *Deleted

## 2021-09-13 DIAGNOSIS — E1122 Type 2 diabetes mellitus with diabetic chronic kidney disease: Secondary | ICD-10-CM | POA: Diagnosis not present

## 2021-09-13 DIAGNOSIS — E7849 Other hyperlipidemia: Secondary | ICD-10-CM | POA: Diagnosis not present

## 2021-09-13 DIAGNOSIS — Z23 Encounter for immunization: Secondary | ICD-10-CM | POA: Diagnosis not present

## 2021-09-13 DIAGNOSIS — K2100 Gastro-esophageal reflux disease with esophagitis, without bleeding: Secondary | ICD-10-CM | POA: Diagnosis not present

## 2021-09-13 DIAGNOSIS — I10 Essential (primary) hypertension: Secondary | ICD-10-CM | POA: Diagnosis not present

## 2021-09-13 DIAGNOSIS — I25709 Atherosclerosis of coronary artery bypass graft(s), unspecified, with unspecified angina pectoris: Secondary | ICD-10-CM | POA: Diagnosis not present

## 2021-09-13 DIAGNOSIS — R4582 Worries: Secondary | ICD-10-CM | POA: Diagnosis not present

## 2021-09-13 NOTE — Telephone Encounter (Signed)
Hi Dr. Harl Bowie,  Mr. Cassis is planning on having bilateral eyelid surgery on 10/04/2021 and is being asked to hold Aspirin. He has a history of CAD with CABG x4 in 2010 and then redo CABG x3 in 2016. It does not look like he has had any repeat ischemic evaluation since that time. Can you please comment on how long Aspirin can be held prior to procedure?  Please route response back to P CV DIV PREOP.  Thank you! Kaitlynn Tramontana

## 2021-09-13 NOTE — Telephone Encounter (Signed)
   [facility] Pre-operative Risk Assessment    Patient Name: Ricardo Jimenez  DOB: 26-Dec-1950 MRN: [id number]  HEARTCARE STAFF:  - IMPORTANT!!!!!! Under Visit Info/Reason for Call, type in Other and utilize the format Clearance MM/DD/YY or Clearance TBD. Do not use dashes or single digits. - Please review there is not already an duplicate clearance open for this procedure. - If request is for dental extraction, please clarify the # of teeth to be extracted. - If the patient is currently at the dentist's office, call Pre-Op Callback Staff (MA/nurse) to input urgent request.  - If the patient is not currently in the dentist office, please route to the Pre-Op pool.  Request for surgical clearance:  What type of surgery is being performed? Bilateral upper eyelid biepharoplasty with bilateral lateral brow ptosis repair  When is this surgery scheduled? 10/04/2021  What type of clearance is required (medical clearance vs. Pharmacy clearance to hold med vs. Both)? both  Are there any medications that need to be held prior to surgery and how long? Aspirin (give duration)  Practice name and name of physician performing surgery? [facility]/Dr. Sarajane Marek ([place])  What is the office phone number? [phone]   7.   What is the office fax number? [phone]  8.   Anesthesia type (None, local, MAC, general) ? MAC   Marlou Sa 09/13/2021, 3:35 PM  _________________________________________________________________   (provider comments below)

## 2021-09-19 NOTE — Telephone Encounter (Signed)
   Name: Ricardo Jimenez  DOB: 07-17-1951  MRN: [id number]   Primary Cardiologist: Carlyle Dolly, MD  Chart reviewed as part of pre-operative protocol coverage. Patient was contacted 09/19/2021 in reference to pre-operative risk assessment for pending surgery as outlined below.  Ricardo Jimenez was last seen on 04/27/21 by Katina Dung NP.  Since that day, Ricardo Jimenez has done well.  He can complete more than 4.0 METS without angina.   Per Dr. Harl Bowie: He can hold aspirin as needed for procedure, typically held 7 days prior and resumed day after.  Therefore, based on ACC/AHA guidelines, the patient would be at acceptable risk for the planned procedure without further cardiovascular testing.   The patient was advised that if he develops new symptoms prior to surgery to contact our office to arrange for a follow-up visit, and he verbalized understanding.  I will route this recommendation to the requesting party via Epic fax function and remove from pre-op pool. Please call with questions.  Tami Lin Cherine Drumgoole, PA 09/19/2021, 9:18 AM

## 2021-09-19 NOTE — Telephone Encounter (Signed)
Can hold aspirin as needed for procedure, typically held 7 days prior and resumed day after   Zandra Abts MD

## 2021-10-02 DIAGNOSIS — J019 Acute sinusitis, unspecified: Secondary | ICD-10-CM | POA: Diagnosis not present

## 2021-10-18 DIAGNOSIS — H02834 Dermatochalasis of left upper eyelid: Secondary | ICD-10-CM | POA: Diagnosis not present

## 2021-10-18 DIAGNOSIS — H53483 Generalized contraction of visual field, bilateral: Secondary | ICD-10-CM | POA: Diagnosis not present

## 2021-10-18 DIAGNOSIS — H02831 Dermatochalasis of right upper eyelid: Secondary | ICD-10-CM | POA: Diagnosis not present

## 2021-10-18 DIAGNOSIS — H02412 Mechanical ptosis of left eyelid: Secondary | ICD-10-CM | POA: Diagnosis not present

## 2021-10-18 DIAGNOSIS — H02413 Mechanical ptosis of bilateral eyelids: Secondary | ICD-10-CM | POA: Diagnosis not present

## 2021-10-18 DIAGNOSIS — H02411 Mechanical ptosis of right eyelid: Secondary | ICD-10-CM | POA: Diagnosis not present

## 2021-11-22 ENCOUNTER — Other Ambulatory Visit: Payer: Self-pay | Admitting: *Deleted

## 2021-11-22 MED ORDER — ISOSORBIDE MONONITRATE ER 30 MG PO TB24: 15.0000 mg | ORAL_TABLET | Freq: Every day | ORAL | 1 refills | Status: DC

## 2021-11-30 DIAGNOSIS — N400 Enlarged prostate without lower urinary tract symptoms: Secondary | ICD-10-CM | POA: Diagnosis not present

## 2021-11-30 DIAGNOSIS — R8290 Unspecified abnormal findings in urine: Secondary | ICD-10-CM | POA: Diagnosis not present

## 2021-11-30 DIAGNOSIS — R3129 Other microscopic hematuria: Secondary | ICD-10-CM | POA: Diagnosis not present

## 2021-11-30 DIAGNOSIS — N200 Calculus of kidney: Secondary | ICD-10-CM | POA: Diagnosis not present

## 2021-11-30 DIAGNOSIS — Q631 Lobulated, fused and horseshoe kidney: Secondary | ICD-10-CM | POA: Diagnosis not present

## 2022-01-15 DIAGNOSIS — Z125 Encounter for screening for malignant neoplasm of prostate: Secondary | ICD-10-CM | POA: Diagnosis not present

## 2022-01-15 DIAGNOSIS — N183 Chronic kidney disease, stage 3 unspecified: Secondary | ICD-10-CM | POA: Diagnosis not present

## 2022-01-15 DIAGNOSIS — E782 Mixed hyperlipidemia: Secondary | ICD-10-CM | POA: Diagnosis not present

## 2022-01-15 DIAGNOSIS — E7849 Other hyperlipidemia: Secondary | ICD-10-CM | POA: Diagnosis not present

## 2022-01-15 DIAGNOSIS — N189 Chronic kidney disease, unspecified: Secondary | ICD-10-CM | POA: Diagnosis not present

## 2022-01-15 DIAGNOSIS — K2100 Gastro-esophageal reflux disease with esophagitis, without bleeding: Secondary | ICD-10-CM | POA: Diagnosis not present

## 2022-01-15 DIAGNOSIS — E1122 Type 2 diabetes mellitus with diabetic chronic kidney disease: Secondary | ICD-10-CM | POA: Diagnosis not present

## 2022-01-15 DIAGNOSIS — E7800 Pure hypercholesterolemia, unspecified: Secondary | ICD-10-CM | POA: Diagnosis not present

## 2022-01-15 DIAGNOSIS — E7801 Familial hypercholesterolemia: Secondary | ICD-10-CM | POA: Diagnosis not present

## 2022-01-15 DIAGNOSIS — I10 Essential (primary) hypertension: Secondary | ICD-10-CM | POA: Diagnosis not present

## 2022-01-23 DIAGNOSIS — N1831 Chronic kidney disease, stage 3a: Secondary | ICD-10-CM | POA: Diagnosis not present

## 2022-01-23 DIAGNOSIS — I25709 Atherosclerosis of coronary artery bypass graft(s), unspecified, with unspecified angina pectoris: Secondary | ICD-10-CM | POA: Diagnosis not present

## 2022-01-23 DIAGNOSIS — G6289 Other specified polyneuropathies: Secondary | ICD-10-CM | POA: Diagnosis not present

## 2022-01-23 DIAGNOSIS — R4582 Worries: Secondary | ICD-10-CM | POA: Diagnosis not present

## 2022-01-23 DIAGNOSIS — E1122 Type 2 diabetes mellitus with diabetic chronic kidney disease: Secondary | ICD-10-CM | POA: Diagnosis not present

## 2022-01-23 DIAGNOSIS — E7849 Other hyperlipidemia: Secondary | ICD-10-CM | POA: Diagnosis not present

## 2022-01-23 DIAGNOSIS — I10 Essential (primary) hypertension: Secondary | ICD-10-CM | POA: Diagnosis not present

## 2022-02-23 DIAGNOSIS — Z6835 Body mass index (BMI) 35.0-35.9, adult: Secondary | ICD-10-CM | POA: Diagnosis not present

## 2022-02-23 DIAGNOSIS — J329 Chronic sinusitis, unspecified: Secondary | ICD-10-CM | POA: Diagnosis not present

## 2022-02-23 DIAGNOSIS — Z20828 Contact with and (suspected) exposure to other viral communicable diseases: Secondary | ICD-10-CM | POA: Diagnosis not present

## 2022-02-23 DIAGNOSIS — R059 Cough, unspecified: Secondary | ICD-10-CM | POA: Diagnosis not present

## 2022-03-01 DIAGNOSIS — M25532 Pain in left wrist: Secondary | ICD-10-CM | POA: Diagnosis not present

## 2022-05-08 DIAGNOSIS — M5416 Radiculopathy, lumbar region: Secondary | ICD-10-CM | POA: Diagnosis not present

## 2022-05-22 ENCOUNTER — Other Ambulatory Visit: Payer: Self-pay | Admitting: Student

## 2022-06-06 ENCOUNTER — Other Ambulatory Visit: Payer: Self-pay | Admitting: Cardiology

## 2022-06-07 ENCOUNTER — Encounter: Payer: Self-pay | Admitting: *Deleted

## 2022-06-12 DIAGNOSIS — Z1329 Encounter for screening for other suspected endocrine disorder: Secondary | ICD-10-CM | POA: Diagnosis not present

## 2022-06-12 DIAGNOSIS — E1122 Type 2 diabetes mellitus with diabetic chronic kidney disease: Secondary | ICD-10-CM | POA: Diagnosis not present

## 2022-06-12 DIAGNOSIS — R5383 Other fatigue: Secondary | ICD-10-CM | POA: Diagnosis not present

## 2022-06-12 DIAGNOSIS — I10 Essential (primary) hypertension: Secondary | ICD-10-CM | POA: Diagnosis not present

## 2022-06-12 DIAGNOSIS — E782 Mixed hyperlipidemia: Secondary | ICD-10-CM | POA: Diagnosis not present

## 2022-06-12 DIAGNOSIS — E7800 Pure hypercholesterolemia, unspecified: Secondary | ICD-10-CM | POA: Diagnosis not present

## 2022-06-12 DIAGNOSIS — E7849 Other hyperlipidemia: Secondary | ICD-10-CM | POA: Diagnosis not present

## 2022-06-12 DIAGNOSIS — N400 Enlarged prostate without lower urinary tract symptoms: Secondary | ICD-10-CM | POA: Diagnosis not present

## 2022-06-12 DIAGNOSIS — E7801 Familial hypercholesterolemia: Secondary | ICD-10-CM | POA: Diagnosis not present

## 2022-06-14 DIAGNOSIS — I714 Abdominal aortic aneurysm, without rupture, unspecified: Secondary | ICD-10-CM | POA: Diagnosis not present

## 2022-06-14 DIAGNOSIS — R4582 Worries: Secondary | ICD-10-CM | POA: Diagnosis not present

## 2022-06-14 DIAGNOSIS — Z0001 Encounter for general adult medical examination with abnormal findings: Secondary | ICD-10-CM | POA: Diagnosis not present

## 2022-06-14 DIAGNOSIS — E1122 Type 2 diabetes mellitus with diabetic chronic kidney disease: Secondary | ICD-10-CM | POA: Diagnosis not present

## 2022-06-14 DIAGNOSIS — G6289 Other specified polyneuropathies: Secondary | ICD-10-CM | POA: Diagnosis not present

## 2022-06-14 DIAGNOSIS — K2100 Gastro-esophageal reflux disease with esophagitis, without bleeding: Secondary | ICD-10-CM | POA: Diagnosis not present

## 2022-06-14 DIAGNOSIS — N1831 Chronic kidney disease, stage 3a: Secondary | ICD-10-CM | POA: Diagnosis not present

## 2022-06-14 DIAGNOSIS — E7849 Other hyperlipidemia: Secondary | ICD-10-CM | POA: Diagnosis not present

## 2022-06-14 DIAGNOSIS — I25709 Atherosclerosis of coronary artery bypass graft(s), unspecified, with unspecified angina pectoris: Secondary | ICD-10-CM | POA: Diagnosis not present

## 2022-06-14 DIAGNOSIS — Z6835 Body mass index (BMI) 35.0-35.9, adult: Secondary | ICD-10-CM | POA: Diagnosis not present

## 2022-06-14 DIAGNOSIS — I10 Essential (primary) hypertension: Secondary | ICD-10-CM | POA: Diagnosis not present

## 2022-06-28 DIAGNOSIS — M13841 Other specified arthritis, right hand: Secondary | ICD-10-CM | POA: Diagnosis not present

## 2022-09-05 ENCOUNTER — Other Ambulatory Visit: Payer: Self-pay | Admitting: Student

## 2022-09-07 ENCOUNTER — Encounter: Payer: Self-pay | Admitting: Internal Medicine

## 2022-09-07 ENCOUNTER — Ambulatory Visit: Payer: Medicare Other | Attending: Internal Medicine | Admitting: Internal Medicine

## 2022-09-07 VITALS — BP 112/68 | HR 57 | Ht 69.0 in | Wt 234.8 lb

## 2022-09-07 DIAGNOSIS — I2510 Atherosclerotic heart disease of native coronary artery without angina pectoris: Secondary | ICD-10-CM | POA: Diagnosis not present

## 2022-09-07 NOTE — Patient Instructions (Addendum)
Medication Instructions:  Your physician has recommended you make the following change in your medication:  Stop isosorbide Continue all other medications as directed  Labwork: none  Testing/Procedures: Your physician has requested that you have an echocardiogram. Echocardiography is a painless test that uses sound waves to create images of your heart. It provides your doctor with information about the size and shape of your heart and how well your heart's chambers and valves are working. This procedure takes approximately one hour. There are no restrictions for this procedure. Please do NOT wear cologne, perfume, aftershave, or lotions (deodorant is allowed). Please arrive 15 minutes prior to your appointment time.   Follow-Up:  Your physician recommends that you schedule a follow-up appointment in: 1 year. You will receive a call in about 10 months reminding you to schedule your appointment. If you do not receive this call, please contact our office.  Any Other Special Instructions Will Be Listed Below (If Applicable).  If you need a refill on your cardiac medications before your next appointment, please call your pharmacy.

## 2022-09-07 NOTE — Progress Notes (Signed)
Cardiology Office Note  Date: 09/07/2022   ID: Ricardo Jimenez, DOB 28-Apr-1951, MRN [id number]  PCP:  Caryl Bis, MD  Cardiologist:  None Electrophysiologist:  None   Reason for Office Visit: Follow-up of CAD  History of Present Illness: Ricardo Jimenez is a 71 y.o. male known to have CAD s/p 4v CABG (LIMA to LAD, SVG to OM1, SVG to OM 3, SVG to D1) in 2010, redo CABG in 2016 (free RIMA to diagonal, right radial artery graft to OM 3 and left radial artery graft to OM1) with LVEF 55% in 2015, HTN, HLD, horseshoe kidney presented to cardiology clinic for follow-up visit.  Patient is here for follow-up visit with me. No interval ER visits or hospitalizations. Patient denied any rest or exertional chest discomfort, tightness, heaviness or pressure, rest or exertional dyspnea, palpitations, light-headedness, syncope and LE swelling. Compliant with medications and no side-effects. No bleeding complications. Denied smoking cigarettes.  He is physically active at baseline, plays golf and bowling.  Past Medical History:  Diagnosis Date   CAD (coronary artery disease)    a. 06/2009 Cath: 3VD, nondominant RCA;  b. 06/2009 CABG x 4: LIMA->LAD, VG->OM1, VG->OM3, VG->D1;  c. 03/2011 ETT: normal;  d. 12/2013 NSTEMI/Cath/PCI: LM nl, LAD 100p, LCX 100p, RCA nl, LIMA->LAD ok, VG->Diag 74m VG->OM1 99d(3.5x18 Xience DES), VG->OM3 100d, EF 55%.   GERD (gastroesophageal reflux disease)    Heart murmur    as a child   Horseshoe kidney    HTN (hypertension)    pt denies this   Hypercholesterolemia    Osteoarthritis    PUD (peptic ulcer disease)    Stones in the urinary tract     Past Surgical History:  Procedure Laterality Date   CARDIAC CATHETERIZATION  CABG   CARPAL TUNNEL RELEASE     bil   COLONOSCOPY     x 2   CORONARY ARTERY BYPASS GRAFT  2010   CORONARY ARTERY BYPASS GRAFT N/A 01/27/2015   Procedure: REDO CORONARY ARTERY BYPASS GRAFTING (CABG) x  three , using right internal mammary  artery and bilateral radial arteries;  Surgeon: BGaye Pollack MD;  [place] [facility]  Service: Open Heart Surgery;  Laterality: N/A;   HERNIA REPAIR  2011   rt ing   KIDNEY STONE SURGERY Left    KNEE ARTHROSCOPY     rt x2, lft x3   LEFT HEART CATHETERIZATION WITH CORONARY/GRAFT ANGIOGRAM N/A 12/28/2013   Procedure: LEFT HEART CATHETERIZATION WITH CBeatrix Fetters  Surgeon: DLarey Dresser MD;  [facility];  Service: Cardiovascular;  Laterality: N/A;   LEFT HEART CATHETERIZATION WITH CORONARY/GRAFT ANGIOGRAM N/A 09/19/2014   Procedure: LEFT HEART CATHETERIZATION WITH CBeatrix Fetters  Surgeon: MWellington Hampshire MD;  [facility];  Service: Cardiovascular;  Laterality: N/A;   OTHER SURGICAL HISTORY     BIL.CARPAL TUNNEL SURG.   RADIAL ARTERY HARVEST Bilateral 01/27/2015   Procedure: BILATERAL RADIAL ARTERY HARVEST;  Surgeon: BGaye Pollack MD;  [place] [facility]  Service: Open Heart Surgery;  Laterality: Bilateral;  BILATERAL RADIAL HARVEST & RIGHT IMA   RT.KNEE REPLACEMENT     TEE WITHOUT CARDIOVERSION N/A 01/27/2015   Procedure: TRANSESOPHAGEAL ECHOCARDIOGRAM (TEE);  Surgeon: BGaye Pollack MD;  [place] [facility]  Service: Open Heart Surgery;  Laterality: N/A;   TOTAL KNEE ARTHROPLASTY  02/11/2012   Procedure: TOTAL KNEE ARTHROPLASTY;  Surgeon: SRudean Haskell MD;  [place] [facility]  Service: Orthopedics;  Laterality: Left;  left total knee replacement   VASECTOMY      Current Outpatient Medications  Medication Sig Dispense Refill   acetaminophen (TYLENOL) 650 MG CR tablet Take 1,300 mg by mouth every morning.      aspirin EC 81 MG tablet Take 81 mg by mouth daily.     atorvastatin (LIPITOR) 80 MG tablet TAKE 1 TABLET BY MOUTH EVERY DAY 90 tablet 0   diphenhydramine-acetaminophen (TYLENOL PM) 25-500 MG TABS tablet Take 1 tablet by mouth at bedtime as needed. For sleep     famotidine (PEPCID) 20 MG tablet Take 20 mg by mouth 2 (two) times daily.      fenofibrate 54 MG tablet Take 1 tablet (54 mg total) by mouth daily. 90 tablet 2   gabapentin (NEURONTIN) 300 MG capsule Take 300-600 mg by mouth 2 (two) times daily. Take 300 mg in the morning and 600 mg at bedtime      glucosamine-chondroitin 500-400 MG tablet Take 1 tablet by mouth 2 (two) times daily.      ibuprofen (ADVIL) 200 MG tablet Take 200 mg by mouth every 6 (six) hours as needed.     metoprolol tartrate (LOPRESSOR) 25 MG tablet Take 25 mg by mouth 2 (two) times daily.      Multiple Vitamin (MULTIVITAMIN) tablet Take 1 tablet by mouth daily.       nitroGLYCERIN (NITROSTAT) 0.4 MG SL tablet Place 1 tablet (0.4 mg total) under the tongue every 5 (five) minutes as needed for chest pain. 25 tablet 4   omega-3 acid ethyl esters (LOVAZA) 1 g capsule Take 2 g by mouth 2 (two) times daily.     potassium citrate (UROCIT-K) 5 MEQ (540 MG) SR tablet Take 10 mEq by mouth 2 (two) times daily.      traMADol (ULTRAM) 50 MG tablet Take 50 mg by mouth as needed.      No current facility-administered medications for this visit.   Allergies:  Patient has no known allergies.   Social History: The patient  reports that he has never smoked. He has never used smokeless tobacco. He reports current alcohol use. He reports that he does not use drugs.   Family History: The patient's family history includes COPD in his father; Hypertension in his mother.   ROS:  Please see the history of present illness. Otherwise, complete review of systems is positive for none.  All other systems are reviewed and negative.   Physical Exam: VS:  BP 112/68   Pulse (!) 57   Ht '5\' 9"'$  (1.753 m)   Wt 234 lb 12.8 oz (106.5 kg)   SpO2 98%   BMI 34.67 kg/m , BMI Body mass index is 34.67 kg/m.  Wt Readings from Last 3 Encounters:  09/07/22 234 lb 12.8 oz (106.5 kg)  04/27/21 239 lb (108.4 kg)  04/27/20 238 lb 9.6 oz (108.2 kg)    General: Patient appears comfortable at rest. HEENT: Conjunctiva and lids normal,  oropharynx clear with moist mucosa. Neck: Supple, no elevated JVP or carotid bruits, no thyromegaly. Lungs: Clear to auscultation, nonlabored breathing at rest. Cardiac: Regular rate and rhythm, no S3 or significant systolic murmur, no pericardial rub. Abdomen: Soft, nontender, no hepatomegaly, bowel sounds present, no guarding or rebound. Extremities: No pitting edema, distal pulses 2+. Skin: Warm and dry. Musculoskeletal: No kyphosis. Neuropsychiatric: Alert and oriented x3, affect grossly appropriate.  Recent Labwork: No results found for requested labs within last 365 days.     Component Value Date/Time  CHOL 193 09/19/2014 0345   TRIG 280 (H) 09/19/2014 0345   HDL 36 (L) 09/19/2014 0345   CHOLHDL 5.4 09/19/2014 0345   VLDL 56 (H) 09/19/2014 0345   LDLCALC 101 (H) 09/19/2014 0345    Other Studies Reviewed Today:   Assessment and Plan: Patient is a 71 year old M known to have CAD s/p 4v CABG (LIMA to LAD, SVG to OM1, SVG to OM 3, SVG to D1) in 2010, redo CABG in 2016 (free RIMA to diagonal, right radial artery graft to OM 3 and left radial artery graft to OM1) with LVEF 55% in 2015, HTN, HLD, horseshoe kidney presented to cardiology clinic for follow-up visit.  #CAD s/p 4v CABG (LIMA to LAD, SVG to OM1, SVG to OM 3, SVG to D1) in 2010, redo CABG in 2016 (free RIMA to diagonal, right radial artery graft to OM 3 and left radial artery graft to OM1) with LVEF 55% in 2015, currently angina free -Continue aspirin 81 mg once daily -Continue atorvastatin 80 mg nightly -Stop Imdur 50 mg once daily -SL NTG 0.4 mg as needed -ER precautions for chest pain provided -Obtain 2D echo  #HLD, unknown values -Continue atorvastatin 80 mg nightly -Continue fenofibrate 54 mg once daily -Obtain lipid panel from PCP -Goal LDL less than 70  #HTN, controlled -Continue metoprolol tartrate 25 mg twice daily  I have spent a total of 33 minutes with patient reviewing chart, EKGs, labs and  examining patient as well as establishing an assessment and plan that was discussed with the patient.  > 50% of time was spent in direct patient care.      Medication Adjustments/Labs and Tests Ordered: Current medicines are reviewed at length with the patient today.  Concerns regarding medicines are outlined above.   Tests Ordered: Orders Placed This Encounter  Procedures   EKG 12-Lead   ECHOCARDIOGRAM COMPLETE    Medication Changes: No orders of the defined types were placed in this encounter.   Disposition:  Follow up  one year  Signed Jonnie Truxillo Fidel Levy, MD, 09/07/2022 3:45 PM    [facility] [place], [place], [place] [address]

## 2022-09-12 ENCOUNTER — Ambulatory Visit: Payer: Medicare Other | Attending: Internal Medicine

## 2022-09-12 DIAGNOSIS — I2510 Atherosclerotic heart disease of native coronary artery without angina pectoris: Secondary | ICD-10-CM | POA: Diagnosis not present

## 2022-09-12 LAB — ECHOCARDIOGRAM COMPLETE
AR max vel: 1.6 cm2
AV Area VTI: 1.59 cm2
AV Area mean vel: 1.44 cm2
AV Mean grad: 7.3 mmHg
AV Peak grad: 13.2 mmHg
Ao pk vel: 1.82 m/s
Area-P 1/2: 3.54 cm2
Calc EF: 69.1 %
MV M vel: 3.84 m/s
MV Peak grad: 58.8 mmHg
S' Lateral: 3.2 cm
Single Plane A2C EF: 69.9 %
Single Plane A4C EF: 67 %

## 2022-09-17 ENCOUNTER — Telehealth: Payer: Self-pay

## 2022-09-17 NOTE — Telephone Encounter (Signed)
Patient returning call.

## 2022-09-17 NOTE — Telephone Encounter (Signed)
-----   Message from Chalmers Guest, MD sent at 09/12/2022  5:39 PM EST ----- Regarding: Add Zetia '10mg'$  Chelsea Primus, LDL is 90s from 05/2022 per PCP labs. I left these papers on my desk, secured inside. He will need to be started on Zetia '10mg'$  once daily in addition to statin and fibrate. If he develops any myalgias, he will need to call the clinic and let us know.

## 2022-09-17 NOTE — Telephone Encounter (Signed)
Lmtcb.

## 2022-09-17 NOTE — Telephone Encounter (Signed)
Patient made aware, verbalized understanding. States that he is going to have lab work done with his PCP tomorrow and will discuss with him, taking the Zetia once his labs come back.

## 2022-09-18 DIAGNOSIS — N400 Enlarged prostate without lower urinary tract symptoms: Secondary | ICD-10-CM | POA: Diagnosis not present

## 2022-09-18 DIAGNOSIS — E7849 Other hyperlipidemia: Secondary | ICD-10-CM | POA: Diagnosis not present

## 2022-09-18 DIAGNOSIS — M13841 Other specified arthritis, right hand: Secondary | ICD-10-CM | POA: Diagnosis not present

## 2022-09-18 DIAGNOSIS — N1831 Chronic kidney disease, stage 3a: Secondary | ICD-10-CM | POA: Diagnosis not present

## 2022-09-18 DIAGNOSIS — K219 Gastro-esophageal reflux disease without esophagitis: Secondary | ICD-10-CM | POA: Diagnosis not present

## 2022-09-18 DIAGNOSIS — R946 Abnormal results of thyroid function studies: Secondary | ICD-10-CM | POA: Diagnosis not present

## 2022-09-18 DIAGNOSIS — E1122 Type 2 diabetes mellitus with diabetic chronic kidney disease: Secondary | ICD-10-CM | POA: Diagnosis not present

## 2022-09-18 DIAGNOSIS — R5383 Other fatigue: Secondary | ICD-10-CM | POA: Diagnosis not present

## 2022-09-18 DIAGNOSIS — I10 Essential (primary) hypertension: Secondary | ICD-10-CM | POA: Diagnosis not present

## 2022-09-25 DIAGNOSIS — N1831 Chronic kidney disease, stage 3a: Secondary | ICD-10-CM | POA: Diagnosis not present

## 2022-09-25 DIAGNOSIS — I25709 Atherosclerosis of coronary artery bypass graft(s), unspecified, with unspecified angina pectoris: Secondary | ICD-10-CM | POA: Diagnosis not present

## 2022-09-25 DIAGNOSIS — E1122 Type 2 diabetes mellitus with diabetic chronic kidney disease: Secondary | ICD-10-CM | POA: Diagnosis not present

## 2022-09-25 DIAGNOSIS — Z6835 Body mass index (BMI) 35.0-35.9, adult: Secondary | ICD-10-CM | POA: Diagnosis not present

## 2022-09-25 DIAGNOSIS — E782 Mixed hyperlipidemia: Secondary | ICD-10-CM | POA: Diagnosis not present

## 2022-09-25 DIAGNOSIS — I10 Essential (primary) hypertension: Secondary | ICD-10-CM | POA: Diagnosis not present

## 2022-09-25 DIAGNOSIS — R4582 Worries: Secondary | ICD-10-CM | POA: Diagnosis not present

## 2022-09-25 DIAGNOSIS — K2100 Gastro-esophageal reflux disease with esophagitis, without bleeding: Secondary | ICD-10-CM | POA: Diagnosis not present

## 2022-09-25 DIAGNOSIS — G6289 Other specified polyneuropathies: Secondary | ICD-10-CM | POA: Diagnosis not present

## 2022-09-25 DIAGNOSIS — Z23 Encounter for immunization: Secondary | ICD-10-CM | POA: Diagnosis not present

## 2022-09-25 DIAGNOSIS — I714 Abdominal aortic aneurysm, without rupture, unspecified: Secondary | ICD-10-CM | POA: Diagnosis not present

## 2022-09-27 DIAGNOSIS — Z1283 Encounter for screening for malignant neoplasm of skin: Secondary | ICD-10-CM | POA: Diagnosis not present

## 2022-09-27 DIAGNOSIS — D225 Melanocytic nevi of trunk: Secondary | ICD-10-CM | POA: Diagnosis not present

## 2022-11-15 DIAGNOSIS — K922 Gastrointestinal hemorrhage, unspecified: Secondary | ICD-10-CM | POA: Diagnosis not present

## 2022-11-15 DIAGNOSIS — R1013 Epigastric pain: Secondary | ICD-10-CM | POA: Diagnosis not present

## 2022-11-15 DIAGNOSIS — R112 Nausea with vomiting, unspecified: Secondary | ICD-10-CM | POA: Diagnosis not present

## 2022-11-16 DIAGNOSIS — Z791 Long term (current) use of non-steroidal anti-inflammatories (NSAID): Secondary | ICD-10-CM | POA: Diagnosis not present

## 2022-11-16 DIAGNOSIS — Z8711 Personal history of peptic ulcer disease: Secondary | ICD-10-CM | POA: Diagnosis not present

## 2022-11-16 DIAGNOSIS — K279 Peptic ulcer, site unspecified, unspecified as acute or chronic, without hemorrhage or perforation: Secondary | ICD-10-CM | POA: Diagnosis not present

## 2022-11-16 DIAGNOSIS — Z79899 Other long term (current) drug therapy: Secondary | ICD-10-CM | POA: Diagnosis not present

## 2022-11-16 DIAGNOSIS — K259 Gastric ulcer, unspecified as acute or chronic, without hemorrhage or perforation: Secondary | ICD-10-CM | POA: Diagnosis not present

## 2022-11-16 DIAGNOSIS — K920 Hematemesis: Secondary | ICD-10-CM | POA: Diagnosis not present

## 2022-11-16 DIAGNOSIS — I2510 Atherosclerotic heart disease of native coronary artery without angina pectoris: Secondary | ICD-10-CM | POA: Diagnosis not present

## 2022-11-16 DIAGNOSIS — K922 Gastrointestinal hemorrhage, unspecified: Secondary | ICD-10-CM | POA: Diagnosis not present

## 2022-11-16 DIAGNOSIS — K2960 Other gastritis without bleeding: Secondary | ICD-10-CM | POA: Diagnosis not present

## 2022-11-16 DIAGNOSIS — Z7982 Long term (current) use of aspirin: Secondary | ICD-10-CM | POA: Diagnosis not present

## 2022-11-16 DIAGNOSIS — D62 Acute posthemorrhagic anemia: Secondary | ICD-10-CM | POA: Diagnosis not present

## 2022-11-16 DIAGNOSIS — Z951 Presence of aortocoronary bypass graft: Secondary | ICD-10-CM | POA: Diagnosis not present

## 2022-11-16 DIAGNOSIS — Z6838 Body mass index (BMI) 38.0-38.9, adult: Secondary | ICD-10-CM | POA: Diagnosis not present

## 2022-11-16 DIAGNOSIS — G4733 Obstructive sleep apnea (adult) (pediatric): Secondary | ICD-10-CM | POA: Diagnosis not present

## 2022-11-16 DIAGNOSIS — E669 Obesity, unspecified: Secondary | ICD-10-CM | POA: Diagnosis not present

## 2022-11-16 DIAGNOSIS — E785 Hyperlipidemia, unspecified: Secondary | ICD-10-CM | POA: Diagnosis not present

## 2022-11-16 DIAGNOSIS — G4730 Sleep apnea, unspecified: Secondary | ICD-10-CM | POA: Diagnosis not present

## 2022-11-16 DIAGNOSIS — I10 Essential (primary) hypertension: Secondary | ICD-10-CM | POA: Diagnosis not present

## 2022-11-16 DIAGNOSIS — R944 Abnormal results of kidney function studies: Secondary | ICD-10-CM | POA: Diagnosis not present

## 2022-11-16 DIAGNOSIS — K2961 Other gastritis with bleeding: Secondary | ICD-10-CM | POA: Diagnosis not present

## 2022-11-16 DIAGNOSIS — K921 Melena: Secondary | ICD-10-CM | POA: Diagnosis not present

## 2022-11-18 DIAGNOSIS — K2961 Other gastritis with bleeding: Secondary | ICD-10-CM | POA: Diagnosis not present

## 2022-11-18 DIAGNOSIS — E785 Hyperlipidemia, unspecified: Secondary | ICD-10-CM | POA: Diagnosis not present

## 2022-11-18 DIAGNOSIS — K259 Gastric ulcer, unspecified as acute or chronic, without hemorrhage or perforation: Secondary | ICD-10-CM | POA: Diagnosis not present

## 2022-11-18 DIAGNOSIS — D62 Acute posthemorrhagic anemia: Secondary | ICD-10-CM | POA: Diagnosis not present

## 2022-11-23 DIAGNOSIS — K259 Gastric ulcer, unspecified as acute or chronic, without hemorrhage or perforation: Secondary | ICD-10-CM | POA: Diagnosis not present

## 2022-12-16 DIAGNOSIS — K259 Gastric ulcer, unspecified as acute or chronic, without hemorrhage or perforation: Secondary | ICD-10-CM | POA: Diagnosis not present

## 2022-12-16 DIAGNOSIS — Z6835 Body mass index (BMI) 35.0-35.9, adult: Secondary | ICD-10-CM | POA: Diagnosis not present

## 2022-12-20 DIAGNOSIS — M5416 Radiculopathy, lumbar region: Secondary | ICD-10-CM | POA: Diagnosis not present

## 2023-01-02 DIAGNOSIS — K3189 Other diseases of stomach and duodenum: Secondary | ICD-10-CM | POA: Diagnosis not present

## 2023-01-02 DIAGNOSIS — Z6834 Body mass index (BMI) 34.0-34.9, adult: Secondary | ICD-10-CM | POA: Diagnosis not present

## 2023-01-02 DIAGNOSIS — K259 Gastric ulcer, unspecified as acute or chronic, without hemorrhage or perforation: Secondary | ICD-10-CM | POA: Diagnosis not present

## 2023-01-02 DIAGNOSIS — G4730 Sleep apnea, unspecified: Secondary | ICD-10-CM | POA: Diagnosis not present

## 2023-01-03 ENCOUNTER — Other Ambulatory Visit: Payer: Self-pay | Admitting: Student

## 2023-01-17 DIAGNOSIS — E7849 Other hyperlipidemia: Secondary | ICD-10-CM | POA: Diagnosis not present

## 2023-01-17 DIAGNOSIS — E039 Hypothyroidism, unspecified: Secondary | ICD-10-CM | POA: Diagnosis not present

## 2023-01-17 DIAGNOSIS — N183 Chronic kidney disease, stage 3 unspecified: Secondary | ICD-10-CM | POA: Diagnosis not present

## 2023-01-17 DIAGNOSIS — K219 Gastro-esophageal reflux disease without esophagitis: Secondary | ICD-10-CM | POA: Diagnosis not present

## 2023-01-17 DIAGNOSIS — N400 Enlarged prostate without lower urinary tract symptoms: Secondary | ICD-10-CM | POA: Diagnosis not present

## 2023-01-17 DIAGNOSIS — I10 Essential (primary) hypertension: Secondary | ICD-10-CM | POA: Diagnosis not present

## 2023-01-17 DIAGNOSIS — E1122 Type 2 diabetes mellitus with diabetic chronic kidney disease: Secondary | ICD-10-CM | POA: Diagnosis not present

## 2023-01-25 DIAGNOSIS — I25709 Atherosclerosis of coronary artery bypass graft(s), unspecified, with unspecified angina pectoris: Secondary | ICD-10-CM | POA: Diagnosis not present

## 2023-01-25 DIAGNOSIS — Z6835 Body mass index (BMI) 35.0-35.9, adult: Secondary | ICD-10-CM | POA: Diagnosis not present

## 2023-01-25 DIAGNOSIS — G6289 Other specified polyneuropathies: Secondary | ICD-10-CM | POA: Diagnosis not present

## 2023-01-25 DIAGNOSIS — K2100 Gastro-esophageal reflux disease with esophagitis, without bleeding: Secondary | ICD-10-CM | POA: Diagnosis not present

## 2023-01-25 DIAGNOSIS — E7849 Other hyperlipidemia: Secondary | ICD-10-CM | POA: Diagnosis not present

## 2023-01-25 DIAGNOSIS — I10 Essential (primary) hypertension: Secondary | ICD-10-CM | POA: Diagnosis not present

## 2023-01-25 DIAGNOSIS — N1831 Chronic kidney disease, stage 3a: Secondary | ICD-10-CM | POA: Diagnosis not present

## 2023-01-25 DIAGNOSIS — I714 Abdominal aortic aneurysm, without rupture, unspecified: Secondary | ICD-10-CM | POA: Diagnosis not present

## 2023-01-25 DIAGNOSIS — E1122 Type 2 diabetes mellitus with diabetic chronic kidney disease: Secondary | ICD-10-CM | POA: Diagnosis not present

## 2023-02-14 DIAGNOSIS — M13841 Other specified arthritis, right hand: Secondary | ICD-10-CM | POA: Diagnosis not present

## 2023-03-14 DIAGNOSIS — K259 Gastric ulcer, unspecified as acute or chronic, without hemorrhage or perforation: Secondary | ICD-10-CM | POA: Diagnosis not present

## 2023-03-14 DIAGNOSIS — G4730 Sleep apnea, unspecified: Secondary | ICD-10-CM | POA: Diagnosis not present

## 2023-03-14 DIAGNOSIS — K2950 Unspecified chronic gastritis without bleeding: Secondary | ICD-10-CM | POA: Diagnosis not present

## 2023-05-09 DIAGNOSIS — S338XXA Sprain of other parts of lumbar spine and pelvis, initial encounter: Secondary | ICD-10-CM | POA: Diagnosis not present

## 2023-05-09 DIAGNOSIS — S233XXA Sprain of ligaments of thoracic spine, initial encounter: Secondary | ICD-10-CM | POA: Diagnosis not present

## 2023-05-09 DIAGNOSIS — M9902 Segmental and somatic dysfunction of thoracic region: Secondary | ICD-10-CM | POA: Diagnosis not present

## 2023-05-09 DIAGNOSIS — M9903 Segmental and somatic dysfunction of lumbar region: Secondary | ICD-10-CM | POA: Diagnosis not present

## 2023-05-15 DIAGNOSIS — M9902 Segmental and somatic dysfunction of thoracic region: Secondary | ICD-10-CM | POA: Diagnosis not present

## 2023-05-15 DIAGNOSIS — M9903 Segmental and somatic dysfunction of lumbar region: Secondary | ICD-10-CM | POA: Diagnosis not present

## 2023-05-15 DIAGNOSIS — S338XXA Sprain of other parts of lumbar spine and pelvis, initial encounter: Secondary | ICD-10-CM | POA: Diagnosis not present

## 2023-05-15 DIAGNOSIS — S233XXA Sprain of ligaments of thoracic spine, initial encounter: Secondary | ICD-10-CM | POA: Diagnosis not present

## 2023-05-21 DIAGNOSIS — S338XXA Sprain of other parts of lumbar spine and pelvis, initial encounter: Secondary | ICD-10-CM | POA: Diagnosis not present

## 2023-05-21 DIAGNOSIS — S233XXA Sprain of ligaments of thoracic spine, initial encounter: Secondary | ICD-10-CM | POA: Diagnosis not present

## 2023-05-21 DIAGNOSIS — M9902 Segmental and somatic dysfunction of thoracic region: Secondary | ICD-10-CM | POA: Diagnosis not present

## 2023-05-21 DIAGNOSIS — M9903 Segmental and somatic dysfunction of lumbar region: Secondary | ICD-10-CM | POA: Diagnosis not present

## 2023-05-28 DIAGNOSIS — M9902 Segmental and somatic dysfunction of thoracic region: Secondary | ICD-10-CM | POA: Diagnosis not present

## 2023-05-28 DIAGNOSIS — M9903 Segmental and somatic dysfunction of lumbar region: Secondary | ICD-10-CM | POA: Diagnosis not present

## 2023-05-28 DIAGNOSIS — S233XXA Sprain of ligaments of thoracic spine, initial encounter: Secondary | ICD-10-CM | POA: Diagnosis not present

## 2023-05-28 DIAGNOSIS — S338XXA Sprain of other parts of lumbar spine and pelvis, initial encounter: Secondary | ICD-10-CM | POA: Diagnosis not present

## 2023-05-29 DIAGNOSIS — E7849 Other hyperlipidemia: Secondary | ICD-10-CM | POA: Diagnosis not present

## 2023-05-29 DIAGNOSIS — E1122 Type 2 diabetes mellitus with diabetic chronic kidney disease: Secondary | ICD-10-CM | POA: Diagnosis not present

## 2023-05-29 DIAGNOSIS — N400 Enlarged prostate without lower urinary tract symptoms: Secondary | ICD-10-CM | POA: Diagnosis not present

## 2023-05-29 DIAGNOSIS — E7800 Pure hypercholesterolemia, unspecified: Secondary | ICD-10-CM | POA: Diagnosis not present

## 2023-05-29 DIAGNOSIS — R739 Hyperglycemia, unspecified: Secondary | ICD-10-CM | POA: Diagnosis not present

## 2023-05-29 DIAGNOSIS — E782 Mixed hyperlipidemia: Secondary | ICD-10-CM | POA: Diagnosis not present

## 2023-05-29 DIAGNOSIS — E559 Vitamin D deficiency, unspecified: Secondary | ICD-10-CM | POA: Diagnosis not present

## 2023-05-29 DIAGNOSIS — E7801 Familial hypercholesterolemia: Secondary | ICD-10-CM | POA: Diagnosis not present

## 2023-05-29 DIAGNOSIS — R946 Abnormal results of thyroid function studies: Secondary | ICD-10-CM | POA: Diagnosis not present

## 2023-05-29 DIAGNOSIS — Z125 Encounter for screening for malignant neoplasm of prostate: Secondary | ICD-10-CM | POA: Diagnosis not present

## 2023-05-29 DIAGNOSIS — Z1329 Encounter for screening for other suspected endocrine disorder: Secondary | ICD-10-CM | POA: Diagnosis not present

## 2023-05-29 DIAGNOSIS — N183 Chronic kidney disease, stage 3 unspecified: Secondary | ICD-10-CM | POA: Diagnosis not present

## 2023-05-29 DIAGNOSIS — I10 Essential (primary) hypertension: Secondary | ICD-10-CM | POA: Diagnosis not present

## 2023-05-29 DIAGNOSIS — Z0001 Encounter for general adult medical examination with abnormal findings: Secondary | ICD-10-CM | POA: Diagnosis not present

## 2023-05-29 DIAGNOSIS — E039 Hypothyroidism, unspecified: Secondary | ICD-10-CM | POA: Diagnosis not present

## 2023-06-04 DIAGNOSIS — E1122 Type 2 diabetes mellitus with diabetic chronic kidney disease: Secondary | ICD-10-CM | POA: Diagnosis not present

## 2023-06-04 DIAGNOSIS — G6289 Other specified polyneuropathies: Secondary | ICD-10-CM | POA: Diagnosis not present

## 2023-06-04 DIAGNOSIS — Z6835 Body mass index (BMI) 35.0-35.9, adult: Secondary | ICD-10-CM | POA: Diagnosis not present

## 2023-06-04 DIAGNOSIS — I714 Abdominal aortic aneurysm, without rupture, unspecified: Secondary | ICD-10-CM | POA: Diagnosis not present

## 2023-06-04 DIAGNOSIS — K2100 Gastro-esophageal reflux disease with esophagitis, without bleeding: Secondary | ICD-10-CM | POA: Diagnosis not present

## 2023-06-04 DIAGNOSIS — R4582 Worries: Secondary | ICD-10-CM | POA: Diagnosis not present

## 2023-06-04 DIAGNOSIS — I25709 Atherosclerosis of coronary artery bypass graft(s), unspecified, with unspecified angina pectoris: Secondary | ICD-10-CM | POA: Diagnosis not present

## 2023-06-04 DIAGNOSIS — E7849 Other hyperlipidemia: Secondary | ICD-10-CM | POA: Diagnosis not present

## 2023-06-04 DIAGNOSIS — Z0001 Encounter for general adult medical examination with abnormal findings: Secondary | ICD-10-CM | POA: Diagnosis not present

## 2023-06-04 DIAGNOSIS — N1831 Chronic kidney disease, stage 3a: Secondary | ICD-10-CM | POA: Diagnosis not present

## 2023-06-04 DIAGNOSIS — I10 Essential (primary) hypertension: Secondary | ICD-10-CM | POA: Diagnosis not present

## 2023-06-05 DIAGNOSIS — S233XXA Sprain of ligaments of thoracic spine, initial encounter: Secondary | ICD-10-CM | POA: Diagnosis not present

## 2023-06-05 DIAGNOSIS — S338XXA Sprain of other parts of lumbar spine and pelvis, initial encounter: Secondary | ICD-10-CM | POA: Diagnosis not present

## 2023-06-05 DIAGNOSIS — M9902 Segmental and somatic dysfunction of thoracic region: Secondary | ICD-10-CM | POA: Diagnosis not present

## 2023-06-05 DIAGNOSIS — M9903 Segmental and somatic dysfunction of lumbar region: Secondary | ICD-10-CM | POA: Diagnosis not present

## 2023-06-11 DIAGNOSIS — S338XXA Sprain of other parts of lumbar spine and pelvis, initial encounter: Secondary | ICD-10-CM | POA: Diagnosis not present

## 2023-06-11 DIAGNOSIS — M9903 Segmental and somatic dysfunction of lumbar region: Secondary | ICD-10-CM | POA: Diagnosis not present

## 2023-06-11 DIAGNOSIS — S233XXA Sprain of ligaments of thoracic spine, initial encounter: Secondary | ICD-10-CM | POA: Diagnosis not present

## 2023-06-11 DIAGNOSIS — M9902 Segmental and somatic dysfunction of thoracic region: Secondary | ICD-10-CM | POA: Diagnosis not present

## 2023-06-20 DIAGNOSIS — M13841 Other specified arthritis, right hand: Secondary | ICD-10-CM | POA: Diagnosis not present

## 2023-07-02 DIAGNOSIS — S233XXA Sprain of ligaments of thoracic spine, initial encounter: Secondary | ICD-10-CM | POA: Diagnosis not present

## 2023-07-02 DIAGNOSIS — M9902 Segmental and somatic dysfunction of thoracic region: Secondary | ICD-10-CM | POA: Diagnosis not present

## 2023-07-02 DIAGNOSIS — S338XXA Sprain of other parts of lumbar spine and pelvis, initial encounter: Secondary | ICD-10-CM | POA: Diagnosis not present

## 2023-07-02 DIAGNOSIS — M9903 Segmental and somatic dysfunction of lumbar region: Secondary | ICD-10-CM | POA: Diagnosis not present

## 2023-07-23 DIAGNOSIS — M9903 Segmental and somatic dysfunction of lumbar region: Secondary | ICD-10-CM | POA: Diagnosis not present

## 2023-07-23 DIAGNOSIS — S338XXA Sprain of other parts of lumbar spine and pelvis, initial encounter: Secondary | ICD-10-CM | POA: Diagnosis not present

## 2023-07-23 DIAGNOSIS — S233XXA Sprain of ligaments of thoracic spine, initial encounter: Secondary | ICD-10-CM | POA: Diagnosis not present

## 2023-07-23 DIAGNOSIS — M9902 Segmental and somatic dysfunction of thoracic region: Secondary | ICD-10-CM | POA: Diagnosis not present

## 2023-08-03 DIAGNOSIS — K111 Hypertrophy of salivary gland: Secondary | ICD-10-CM | POA: Diagnosis not present

## 2023-08-03 DIAGNOSIS — R030 Elevated blood-pressure reading, without diagnosis of hypertension: Secondary | ICD-10-CM | POA: Diagnosis not present

## 2023-08-03 DIAGNOSIS — Z6835 Body mass index (BMI) 35.0-35.9, adult: Secondary | ICD-10-CM | POA: Diagnosis not present

## 2023-08-13 DIAGNOSIS — M9902 Segmental and somatic dysfunction of thoracic region: Secondary | ICD-10-CM | POA: Diagnosis not present

## 2023-08-13 DIAGNOSIS — S233XXA Sprain of ligaments of thoracic spine, initial encounter: Secondary | ICD-10-CM | POA: Diagnosis not present

## 2023-08-13 DIAGNOSIS — S338XXA Sprain of other parts of lumbar spine and pelvis, initial encounter: Secondary | ICD-10-CM | POA: Diagnosis not present

## 2023-08-13 DIAGNOSIS — M9903 Segmental and somatic dysfunction of lumbar region: Secondary | ICD-10-CM | POA: Diagnosis not present

## 2023-08-22 DIAGNOSIS — N400 Enlarged prostate without lower urinary tract symptoms: Secondary | ICD-10-CM | POA: Diagnosis not present

## 2023-08-22 DIAGNOSIS — E782 Mixed hyperlipidemia: Secondary | ICD-10-CM | POA: Diagnosis not present

## 2023-08-22 DIAGNOSIS — E1122 Type 2 diabetes mellitus with diabetic chronic kidney disease: Secondary | ICD-10-CM | POA: Diagnosis not present

## 2023-09-03 DIAGNOSIS — M9903 Segmental and somatic dysfunction of lumbar region: Secondary | ICD-10-CM | POA: Diagnosis not present

## 2023-09-03 DIAGNOSIS — S338XXA Sprain of other parts of lumbar spine and pelvis, initial encounter: Secondary | ICD-10-CM | POA: Diagnosis not present

## 2023-09-03 DIAGNOSIS — M9902 Segmental and somatic dysfunction of thoracic region: Secondary | ICD-10-CM | POA: Diagnosis not present

## 2023-09-03 DIAGNOSIS — S233XXA Sprain of ligaments of thoracic spine, initial encounter: Secondary | ICD-10-CM | POA: Diagnosis not present

## 2023-09-12 DIAGNOSIS — M5416 Radiculopathy, lumbar region: Secondary | ICD-10-CM | POA: Diagnosis not present

## 2023-09-17 DIAGNOSIS — M9903 Segmental and somatic dysfunction of lumbar region: Secondary | ICD-10-CM | POA: Diagnosis not present

## 2023-09-17 DIAGNOSIS — M9902 Segmental and somatic dysfunction of thoracic region: Secondary | ICD-10-CM | POA: Diagnosis not present

## 2023-09-17 DIAGNOSIS — S338XXA Sprain of other parts of lumbar spine and pelvis, initial encounter: Secondary | ICD-10-CM | POA: Diagnosis not present

## 2023-09-17 DIAGNOSIS — S233XXA Sprain of ligaments of thoracic spine, initial encounter: Secondary | ICD-10-CM | POA: Diagnosis not present

## 2023-09-24 DIAGNOSIS — M5416 Radiculopathy, lumbar region: Secondary | ICD-10-CM | POA: Diagnosis not present

## 2023-10-01 DIAGNOSIS — N400 Enlarged prostate without lower urinary tract symptoms: Secondary | ICD-10-CM | POA: Diagnosis not present

## 2023-10-01 DIAGNOSIS — I10 Essential (primary) hypertension: Secondary | ICD-10-CM | POA: Diagnosis not present

## 2023-10-01 DIAGNOSIS — E7849 Other hyperlipidemia: Secondary | ICD-10-CM | POA: Diagnosis not present

## 2023-10-01 DIAGNOSIS — N1831 Chronic kidney disease, stage 3a: Secondary | ICD-10-CM | POA: Diagnosis not present

## 2023-10-01 DIAGNOSIS — E7801 Familial hypercholesterolemia: Secondary | ICD-10-CM | POA: Diagnosis not present

## 2023-10-01 DIAGNOSIS — Z1321 Encounter for screening for nutritional disorder: Secondary | ICD-10-CM | POA: Diagnosis not present

## 2023-10-01 DIAGNOSIS — S233XXA Sprain of ligaments of thoracic spine, initial encounter: Secondary | ICD-10-CM | POA: Diagnosis not present

## 2023-10-01 DIAGNOSIS — M9903 Segmental and somatic dysfunction of lumbar region: Secondary | ICD-10-CM | POA: Diagnosis not present

## 2023-10-01 DIAGNOSIS — M9902 Segmental and somatic dysfunction of thoracic region: Secondary | ICD-10-CM | POA: Diagnosis not present

## 2023-10-01 DIAGNOSIS — E1122 Type 2 diabetes mellitus with diabetic chronic kidney disease: Secondary | ICD-10-CM | POA: Diagnosis not present

## 2023-10-01 DIAGNOSIS — S338XXA Sprain of other parts of lumbar spine and pelvis, initial encounter: Secondary | ICD-10-CM | POA: Diagnosis not present

## 2023-10-08 DIAGNOSIS — N1831 Chronic kidney disease, stage 3a: Secondary | ICD-10-CM | POA: Diagnosis not present

## 2023-10-08 DIAGNOSIS — G6289 Other specified polyneuropathies: Secondary | ICD-10-CM | POA: Diagnosis not present

## 2023-10-08 DIAGNOSIS — Z6834 Body mass index (BMI) 34.0-34.9, adult: Secondary | ICD-10-CM | POA: Diagnosis not present

## 2023-10-08 DIAGNOSIS — R4582 Worries: Secondary | ICD-10-CM | POA: Diagnosis not present

## 2023-10-08 DIAGNOSIS — I10 Essential (primary) hypertension: Secondary | ICD-10-CM | POA: Diagnosis not present

## 2023-10-08 DIAGNOSIS — E7849 Other hyperlipidemia: Secondary | ICD-10-CM | POA: Diagnosis not present

## 2023-10-08 DIAGNOSIS — I714 Abdominal aortic aneurysm, without rupture, unspecified: Secondary | ICD-10-CM | POA: Diagnosis not present

## 2023-10-08 DIAGNOSIS — K2100 Gastro-esophageal reflux disease with esophagitis, without bleeding: Secondary | ICD-10-CM | POA: Diagnosis not present

## 2023-10-08 DIAGNOSIS — I25709 Atherosclerosis of coronary artery bypass graft(s), unspecified, with unspecified angina pectoris: Secondary | ICD-10-CM | POA: Diagnosis not present

## 2023-10-08 DIAGNOSIS — E1122 Type 2 diabetes mellitus with diabetic chronic kidney disease: Secondary | ICD-10-CM | POA: Diagnosis not present

## 2023-11-22 DIAGNOSIS — E1122 Type 2 diabetes mellitus with diabetic chronic kidney disease: Secondary | ICD-10-CM | POA: Diagnosis not present

## 2023-11-22 DIAGNOSIS — K259 Gastric ulcer, unspecified as acute or chronic, without hemorrhage or perforation: Secondary | ICD-10-CM | POA: Diagnosis not present

## 2023-11-22 DIAGNOSIS — E782 Mixed hyperlipidemia: Secondary | ICD-10-CM | POA: Diagnosis not present

## 2023-12-20 DIAGNOSIS — E782 Mixed hyperlipidemia: Secondary | ICD-10-CM | POA: Diagnosis not present

## 2023-12-20 DIAGNOSIS — K259 Gastric ulcer, unspecified as acute or chronic, without hemorrhage or perforation: Secondary | ICD-10-CM | POA: Diagnosis not present

## 2023-12-20 DIAGNOSIS — E1122 Type 2 diabetes mellitus with diabetic chronic kidney disease: Secondary | ICD-10-CM | POA: Diagnosis not present

## 2023-12-20 DIAGNOSIS — I25709 Atherosclerosis of coronary artery bypass graft(s), unspecified, with unspecified angina pectoris: Secondary | ICD-10-CM | POA: Diagnosis not present

## 2023-12-31 DIAGNOSIS — M9902 Segmental and somatic dysfunction of thoracic region: Secondary | ICD-10-CM | POA: Diagnosis not present

## 2023-12-31 DIAGNOSIS — S338XXA Sprain of other parts of lumbar spine and pelvis, initial encounter: Secondary | ICD-10-CM | POA: Diagnosis not present

## 2023-12-31 DIAGNOSIS — S233XXA Sprain of ligaments of thoracic spine, initial encounter: Secondary | ICD-10-CM | POA: Diagnosis not present

## 2023-12-31 DIAGNOSIS — M9903 Segmental and somatic dysfunction of lumbar region: Secondary | ICD-10-CM | POA: Diagnosis not present

## 2024-01-09 DIAGNOSIS — S233XXA Sprain of ligaments of thoracic spine, initial encounter: Secondary | ICD-10-CM | POA: Diagnosis not present

## 2024-01-09 DIAGNOSIS — S338XXA Sprain of other parts of lumbar spine and pelvis, initial encounter: Secondary | ICD-10-CM | POA: Diagnosis not present

## 2024-01-09 DIAGNOSIS — M9903 Segmental and somatic dysfunction of lumbar region: Secondary | ICD-10-CM | POA: Diagnosis not present

## 2024-01-09 DIAGNOSIS — M9902 Segmental and somatic dysfunction of thoracic region: Secondary | ICD-10-CM | POA: Diagnosis not present

## 2024-01-20 DIAGNOSIS — I25709 Atherosclerosis of coronary artery bypass graft(s), unspecified, with unspecified angina pectoris: Secondary | ICD-10-CM | POA: Diagnosis not present

## 2024-01-20 DIAGNOSIS — E1122 Type 2 diabetes mellitus with diabetic chronic kidney disease: Secondary | ICD-10-CM | POA: Diagnosis not present

## 2024-01-20 DIAGNOSIS — K259 Gastric ulcer, unspecified as acute or chronic, without hemorrhage or perforation: Secondary | ICD-10-CM | POA: Diagnosis not present

## 2024-01-20 DIAGNOSIS — E782 Mixed hyperlipidemia: Secondary | ICD-10-CM | POA: Diagnosis not present

## 2024-02-11 DIAGNOSIS — Z1329 Encounter for screening for other suspected endocrine disorder: Secondary | ICD-10-CM | POA: Diagnosis not present

## 2024-02-11 DIAGNOSIS — R946 Abnormal results of thyroid function studies: Secondary | ICD-10-CM | POA: Diagnosis not present

## 2024-02-11 DIAGNOSIS — E119 Type 2 diabetes mellitus without complications: Secondary | ICD-10-CM | POA: Diagnosis not present

## 2024-02-11 DIAGNOSIS — Z13220 Encounter for screening for lipoid disorders: Secondary | ICD-10-CM | POA: Diagnosis not present

## 2024-02-11 DIAGNOSIS — N179 Acute kidney failure, unspecified: Secondary | ICD-10-CM | POA: Diagnosis not present

## 2024-02-18 DIAGNOSIS — N1831 Chronic kidney disease, stage 3a: Secondary | ICD-10-CM | POA: Diagnosis not present

## 2024-02-18 DIAGNOSIS — K2100 Gastro-esophageal reflux disease with esophagitis, without bleeding: Secondary | ICD-10-CM | POA: Diagnosis not present

## 2024-02-18 DIAGNOSIS — I10 Essential (primary) hypertension: Secondary | ICD-10-CM | POA: Diagnosis not present

## 2024-02-18 DIAGNOSIS — N200 Calculus of kidney: Secondary | ICD-10-CM | POA: Diagnosis not present

## 2024-02-18 DIAGNOSIS — Z6835 Body mass index (BMI) 35.0-35.9, adult: Secondary | ICD-10-CM | POA: Diagnosis not present

## 2024-02-19 DIAGNOSIS — E782 Mixed hyperlipidemia: Secondary | ICD-10-CM | POA: Diagnosis not present

## 2024-02-19 DIAGNOSIS — I25709 Atherosclerosis of coronary artery bypass graft(s), unspecified, with unspecified angina pectoris: Secondary | ICD-10-CM | POA: Diagnosis not present

## 2024-02-19 DIAGNOSIS — K259 Gastric ulcer, unspecified as acute or chronic, without hemorrhage or perforation: Secondary | ICD-10-CM | POA: Diagnosis not present

## 2024-02-19 DIAGNOSIS — E1122 Type 2 diabetes mellitus with diabetic chronic kidney disease: Secondary | ICD-10-CM | POA: Diagnosis not present

## 2024-03-20 DIAGNOSIS — E1122 Type 2 diabetes mellitus with diabetic chronic kidney disease: Secondary | ICD-10-CM | POA: Diagnosis not present

## 2024-03-20 DIAGNOSIS — E782 Mixed hyperlipidemia: Secondary | ICD-10-CM | POA: Diagnosis not present

## 2024-03-20 DIAGNOSIS — I25709 Atherosclerosis of coronary artery bypass graft(s), unspecified, with unspecified angina pectoris: Secondary | ICD-10-CM | POA: Diagnosis not present

## 2024-03-20 DIAGNOSIS — K259 Gastric ulcer, unspecified as acute or chronic, without hemorrhage or perforation: Secondary | ICD-10-CM | POA: Diagnosis not present

## 2024-04-20 DIAGNOSIS — E1122 Type 2 diabetes mellitus with diabetic chronic kidney disease: Secondary | ICD-10-CM | POA: Diagnosis not present

## 2024-04-20 DIAGNOSIS — K259 Gastric ulcer, unspecified as acute or chronic, without hemorrhage or perforation: Secondary | ICD-10-CM | POA: Diagnosis not present

## 2024-04-20 DIAGNOSIS — I25709 Atherosclerosis of coronary artery bypass graft(s), unspecified, with unspecified angina pectoris: Secondary | ICD-10-CM | POA: Diagnosis not present

## 2024-04-20 DIAGNOSIS — E782 Mixed hyperlipidemia: Secondary | ICD-10-CM | POA: Diagnosis not present

## 2024-05-21 DIAGNOSIS — E782 Mixed hyperlipidemia: Secondary | ICD-10-CM | POA: Diagnosis not present

## 2024-05-21 DIAGNOSIS — E1122 Type 2 diabetes mellitus with diabetic chronic kidney disease: Secondary | ICD-10-CM | POA: Diagnosis not present

## 2024-05-21 DIAGNOSIS — I25709 Atherosclerosis of coronary artery bypass graft(s), unspecified, with unspecified angina pectoris: Secondary | ICD-10-CM | POA: Diagnosis not present

## 2024-05-21 DIAGNOSIS — K259 Gastric ulcer, unspecified as acute or chronic, without hemorrhage or perforation: Secondary | ICD-10-CM | POA: Diagnosis not present

## 2024-06-05 DIAGNOSIS — N1831 Chronic kidney disease, stage 3a: Secondary | ICD-10-CM | POA: Diagnosis not present

## 2024-06-05 DIAGNOSIS — Z1321 Encounter for screening for nutritional disorder: Secondary | ICD-10-CM | POA: Diagnosis not present

## 2024-06-05 DIAGNOSIS — N400 Enlarged prostate without lower urinary tract symptoms: Secondary | ICD-10-CM | POA: Diagnosis not present

## 2024-06-05 DIAGNOSIS — E7849 Other hyperlipidemia: Secondary | ICD-10-CM | POA: Diagnosis not present

## 2024-06-05 DIAGNOSIS — E1122 Type 2 diabetes mellitus with diabetic chronic kidney disease: Secondary | ICD-10-CM | POA: Diagnosis not present

## 2024-06-05 DIAGNOSIS — R946 Abnormal results of thyroid function studies: Secondary | ICD-10-CM | POA: Diagnosis not present

## 2024-06-05 DIAGNOSIS — D559 Anemia due to enzyme disorder, unspecified: Secondary | ICD-10-CM | POA: Diagnosis not present

## 2024-06-05 DIAGNOSIS — N179 Acute kidney failure, unspecified: Secondary | ICD-10-CM | POA: Diagnosis not present

## 2024-06-09 DIAGNOSIS — Z1331 Encounter for screening for depression: Secondary | ICD-10-CM | POA: Diagnosis not present

## 2024-06-09 DIAGNOSIS — E782 Mixed hyperlipidemia: Secondary | ICD-10-CM | POA: Diagnosis not present

## 2024-06-09 DIAGNOSIS — E7849 Other hyperlipidemia: Secondary | ICD-10-CM | POA: Diagnosis not present

## 2024-06-09 DIAGNOSIS — Z0001 Encounter for general adult medical examination with abnormal findings: Secondary | ICD-10-CM | POA: Diagnosis not present

## 2024-06-09 DIAGNOSIS — M5136 Other intervertebral disc degeneration, lumbar region with discogenic back pain only: Secondary | ICD-10-CM | POA: Diagnosis not present

## 2024-06-09 DIAGNOSIS — Z6835 Body mass index (BMI) 35.0-35.9, adult: Secondary | ICD-10-CM | POA: Diagnosis not present

## 2024-06-09 DIAGNOSIS — I10 Essential (primary) hypertension: Secondary | ICD-10-CM | POA: Diagnosis not present

## 2024-06-09 DIAGNOSIS — Z1389 Encounter for screening for other disorder: Secondary | ICD-10-CM | POA: Diagnosis not present

## 2024-06-09 DIAGNOSIS — E1122 Type 2 diabetes mellitus with diabetic chronic kidney disease: Secondary | ICD-10-CM | POA: Diagnosis not present

## 2024-06-18 DIAGNOSIS — Z1212 Encounter for screening for malignant neoplasm of rectum: Secondary | ICD-10-CM | POA: Diagnosis not present

## 2024-06-18 DIAGNOSIS — Z1211 Encounter for screening for malignant neoplasm of colon: Secondary | ICD-10-CM | POA: Diagnosis not present

## 2024-06-19 DIAGNOSIS — E782 Mixed hyperlipidemia: Secondary | ICD-10-CM | POA: Diagnosis not present

## 2024-06-19 DIAGNOSIS — I25709 Atherosclerosis of coronary artery bypass graft(s), unspecified, with unspecified angina pectoris: Secondary | ICD-10-CM | POA: Diagnosis not present

## 2024-06-19 DIAGNOSIS — E1122 Type 2 diabetes mellitus with diabetic chronic kidney disease: Secondary | ICD-10-CM | POA: Diagnosis not present

## 2024-06-19 DIAGNOSIS — K259 Gastric ulcer, unspecified as acute or chronic, without hemorrhage or perforation: Secondary | ICD-10-CM | POA: Diagnosis not present

## 2024-06-24 LAB — COLOGUARD: COLOGUARD: POSITIVE — AB

## 2024-07-15 ENCOUNTER — Encounter (INDEPENDENT_AMBULATORY_CARE_PROVIDER_SITE_OTHER): Payer: Self-pay | Admitting: Gastroenterology

## 2024-07-15 ENCOUNTER — Ambulatory Visit (INDEPENDENT_AMBULATORY_CARE_PROVIDER_SITE_OTHER): Admitting: Gastroenterology

## 2024-07-15 ENCOUNTER — Telehealth (INDEPENDENT_AMBULATORY_CARE_PROVIDER_SITE_OTHER): Payer: Self-pay

## 2024-07-15 VITALS — BP 113/63 | HR 66 | Temp 97.3°F | Ht 69.0 in | Wt 230.0 lb

## 2024-07-15 DIAGNOSIS — Z8711 Personal history of peptic ulcer disease: Secondary | ICD-10-CM | POA: Diagnosis not present

## 2024-07-15 DIAGNOSIS — K279 Peptic ulcer, site unspecified, unspecified as acute or chronic, without hemorrhage or perforation: Secondary | ICD-10-CM | POA: Insufficient documentation

## 2024-07-15 DIAGNOSIS — Z6833 Body mass index (BMI) 33.0-33.9, adult: Secondary | ICD-10-CM | POA: Insufficient documentation

## 2024-07-15 DIAGNOSIS — R195 Other fecal abnormalities: Secondary | ICD-10-CM | POA: Insufficient documentation

## 2024-07-15 MED ORDER — SUTAB 1479-225-188 MG PO TABS: ORAL_TABLET | ORAL | 0 refills | Status: DC

## 2024-07-15 MED ORDER — SUTAB 1479-225-188 MG PO TABS: ORAL_TABLET | ORAL | 0 refills | Status: AC

## 2024-07-15 NOTE — Addendum Note (Signed)
 Addended by: DALLIE LIONEL RAMAN on: 07/15/2024 10:01 AM   Modules accepted: Orders

## 2024-07-15 NOTE — Progress Notes (Signed)
 Daena Alper Faizan Insiya Oshea , M.D. Gastroenterology & Hepatology [facility] Gastroenterology [address] [place], [place] [address] Primary Care Physician: Toribio Jerel MATSU, MD [address] [place] [address]  Chief Complaint: Positive Cologuard  History of Present Illness: Ricardo Jimenez is a 73 y.o. male with coronary artery disease status post CABG, CKD, GERD, hypertension, hyperlipidemia who presents for evaluation of Positive Cologuard  Patient reports regular bowel movements daily.  2 years ago he was admitted normal for bleeding gastric ulcer secondary to NSAID use as per patient and patient wife there was a follow-up appointment upper endoscopy done which showed healing of the ulcer The patient denies having any nausea, vomiting, fever, chills, hematochezia, melena, hematemesis, abdominal distention, abdominal pain, diarrhea, jaundice, pruritus or weight loss.  Labs from 0 05/29/2024 hemoglobin 13.4 platelet 197  Positive Cologuard test 05/2024  Last EGD:2022 [facility]  Esophagus: The esophagus appeared normal without signs of ulceration or inflammation. There were no masses noted. There were no varices seen.  Stomach: A 5-8mm clean based ulcer is present in the gastric antrum. The surrounding tissue is very edematous, however there were no stigmata of bleeding. Four biopsies were taken from the margins of this ulcer and surrounding gastric mucosa. The pylorus was inspected and appears normal. The endoscope passes easily into the duodenum. Retroflexed views of the GE junction are normal. Duodenum: The duodenal mucosa appears normal.   Last Colonoscopy:10+ years ago   FHx: neg for any gastrointestinal/liver disease, no malignancies Social: neg smoking, alcohol  or illicit drug use Surgical: no abdominal surgeries  Past Medical History: Past Medical History:  Diagnosis Date   CAD (coronary artery disease)    a. 06/2009 Cath: 3VD, nondominant RCA;  b. 06/2009 CABG  x 4: LIMA->LAD, VG->OM1, VG->OM3, VG->D1;  c. 03/2011 ETT: normal;  d. 12/2013 NSTEMI/Cath/PCI: LM nl, LAD 100p, LCX 100p, RCA nl, LIMA->LAD ok, VG->Diag 28m, VG->OM1 99d(3.5x18 Xience DES), VG->OM3 100d, EF 55%.   GERD (gastroesophageal reflux disease)    Heart murmur    as a child   Horseshoe kidney    HTN (hypertension)    pt denies this   Hypercholesterolemia    Osteoarthritis    PUD (peptic ulcer disease)    Stones in the urinary tract     Past Surgical History: Past Surgical History:  Procedure Laterality Date   CARDIAC CATHETERIZATION  CABG   CARPAL TUNNEL RELEASE     bil   COLONOSCOPY     x 2   CORONARY ARTERY BYPASS GRAFT  2010   CORONARY ARTERY BYPASS GRAFT N/A 01/27/2015   Procedure: REDO CORONARY ARTERY BYPASS GRAFTING (CABG) x  three , using right internal mammary artery and bilateral radial arteries;  Surgeon: Dorise MARLA Fellers, MD;  Location: [facility] OR;  Service: Open Heart Surgery;  Laterality: N/A;   HERNIA REPAIR  2011   rt ing   KIDNEY STONE SURGERY Left    KNEE ARTHROSCOPY     rt x2, lft x3   LEFT HEART CATHETERIZATION WITH CORONARY/GRAFT ANGIOGRAM N/A 12/28/2013   Procedure: LEFT HEART CATHETERIZATION WITH EL BILE;  Surgeon: Ezra GORMAN Shuck, MD;  Location: [facility] CATH LAB;  Service: Cardiovascular;  Laterality: N/A;   LEFT HEART CATHETERIZATION WITH CORONARY/GRAFT ANGIOGRAM N/A 09/19/2014   Procedure: LEFT HEART CATHETERIZATION WITH EL BILE;  Surgeon: Deatrice DELENA Cage, MD;  Location: [facility] CATH LAB;  Service: Cardiovascular;  Laterality: N/A;   OTHER SURGICAL HISTORY     BIL.CARPAL TUNNEL SURG.  RADIAL ARTERY HARVEST Bilateral 01/27/2015   Procedure: BILATERAL RADIAL ARTERY HARVEST;  Surgeon: Dorise MARLA Fellers, MD;  Location: [facility] OR;  Service: Open Heart Surgery;  Laterality: Bilateral;  BILATERAL RADIAL HARVEST & RIGHT IMA   RT.KNEE REPLACEMENT     TEE WITHOUT CARDIOVERSION N/A 01/27/2015   Procedure: TRANSESOPHAGEAL ECHOCARDIOGRAM (TEE);  Surgeon:  Dorise MARLA Fellers, MD;  Location: [facility] OR;  Service: Open Heart Surgery;  Laterality: N/A;   TOTAL KNEE ARTHROPLASTY  02/11/2012   Procedure: TOTAL KNEE ARTHROPLASTY;  Surgeon: Garnette JONETTA Raman, MD;  Location: [facility] OR;  Service: Orthopedics;  Laterality: Left;  left total knee replacement   VASECTOMY      Family History: Family History  Problem Relation Age of Onset   COPD Father        AGE 37   Hypertension Mother     Social History: Social History   Tobacco Use  Smoking Status Never  Smokeless Tobacco Never   Social History   Substance and Sexual Activity  Alcohol  Use Yes   Alcohol /week: 0.0 standard drinks of alcohol    Comment: 07/22/14 Beer every now and then- AJ   Social History   Substance and Sexual Activity  Drug Use No    Allergies: No Known Allergies  Medications: Current Outpatient Medications  Medication Sig Dispense Refill   acetaminophen  (TYLENOL ) 650 MG CR tablet Take 1,300 mg by mouth every morning.      aspirin  EC 81 MG tablet Take 81 mg by mouth daily.     atorvastatin  (LIPITOR ) 80 MG tablet TAKE 1 TABLET BY MOUTH EVERY DAY 90 tablet 3   diphenhydramine -acetaminophen  (TYLENOL  PM) 25-500 MG TABS tablet Take 1 tablet by mouth at bedtime as needed. For sleep     fenofibrate  54 MG tablet Take 1 tablet (54 mg total) by mouth daily. 90 tablet 2   gabapentin  (NEURONTIN ) 300 MG capsule Take 300-600 mg by mouth 2 (two) times daily. Take 300 mg in the morning and 600 mg at bedtime      glucosamine-chondroitin 500-400 MG tablet Take 1 tablet by mouth 2 (two) times daily.      metFORMIN (GLUCOPHAGE-XR) 500 MG 24 hr tablet Take 1,000 mg by mouth daily.     metoprolol  tartrate (LOPRESSOR ) 25 MG tablet Take 25 mg by mouth 2 (two) times daily.      Multiple Vitamin (MULTIVITAMIN) tablet Take 1 tablet by mouth daily.       nitroGLYCERIN  (NITROSTAT ) 0.4 MG SL tablet Place 1 tablet (0.4 mg total) under the tongue every 5 (five) minutes as needed for chest pain. 25 tablet 4    omega-3 acid ethyl esters (LOVAZA) 1 g capsule Take 2 g by mouth 2 (two) times daily.     potassium citrate  (UROCIT-K ) 5 MEQ (540 MG) SR tablet Take 10 mEq by mouth 2 (two) times daily.      traMADol  (ULTRAM ) 50 MG tablet Take 50 mg by mouth as needed.      UNABLE TO FIND Med Name: Relaxium 10 mg tablet. 1 at bedtime     No current facility-administered medications for this visit.    Review of Systems: GENERAL: negative for malaise, night sweats HEENT: No changes in hearing or vision, no nose bleeds or other nasal problems. NECK: Negative for lumps, goiter, pain and significant neck swelling RESPIRATORY: Negative for cough, wheezing CARDIOVASCULAR: Negative for chest pain, leg swelling, palpitations, orthopnea GI: SEE HPI MUSCULOSKELETAL: Negative for joint pain or swelling, back pain, and muscle pain. SKIN:  Negative for lesions, rash HEMATOLOGY Negative for prolonged bleeding, bruising easily, and swollen nodes. ENDOCRINE: Negative for cold or heat intolerance, polyuria, polydipsia and goiter. NEURO: negative for tremor, gait imbalance, syncope and seizures. The remainder of the review of systems is noncontributory.   Physical Exam: BP 113/63   Pulse 66   Temp (!) 97.3 F (36.3 C)   Ht 5' 9 (1.753 m)   Wt 230 lb (104.3 kg)   BMI 33.97 kg/m  GENERAL: The patient is AO x3, in no acute distress. HEENT: Head is normocephalic and atraumatic. EOMI are intact. Mouth is well hydrated and without lesions. NECK: Supple. No masses LUNGS: Clear to auscultation. No presence of rhonchi/wheezing/rales. Adequate chest expansion HEART: RRR, normal s1 and s2. ABDOMEN: Soft, nontender, no guarding, no peritoneal signs, and nondistended. BS +. No masses.  Imaging/Labs: as above     Latest Ref Rng & Units 02/01/2015    5:00 AM 01/31/2015    6:59 AM 01/29/2015    5:13 AM  CBC  WBC 4.0 - 10.5 K/uL  6.0  8.4   Hemoglobin 13.0 - 17.0 g/dL 9.0  7.3  7.9   Hematocrit 39.0 - 52.0 % 26.9  22.3   23.7   Platelets 150 - 400 K/uL  170  105    No results found for: IRON , TIBC, FERRITIN  I personally reviewed and interpreted the available labs, imaging and endoscopic files.  Impression and Plan:  Ricardo Jimenez is a 73 y.o. male with coronary artery disease status post CABG, CKD, GERD, hypertension, hyperlipidemia who presents for evaluation of Positive Cologuard  #Positive Cologuard  Patient does not have any high risk factors for colorectal cancer malignancy.  He has been asymptomatic. Discussed cologuard results in detail, specifically what it means when the test is positive or negative.  Discussed that there is a possibility that even when the test is positive there may not be a polyp found on colonoscopy. More than 50% of the office visit was dedicated to discussing the procedure, including the day of and risks involved. Patient understands what the procedure involves including the benefits and any risks. Patient understands alternatives to the proposed procedure. Risks including (but not limited to) bleeding, tearing of the lining (perforation), rupture of adjacent organs, problems with heart and lung function, infection, and medication reactions. A small percentage of complications may require surgery, hospitalization, repeat endoscopic procedure, and/or transfusion. A small percentage of polyps and other tumors may not be seen.  - Schedule colonoscopy  #History of Peptic ulcer disease  Patient had bleeding gastric ulcer 2 years ago secondary to NSAID.  Reportedly had a follow-up upper endoscopy demonstrating healed gastric ulcer as per patient  Continue PPI avoid NSAIDs  #BMI 33   Recommendation :     - walking at a brisk pace/biking at moderate intesity 2.5-5 hours per week     - use pedometer/step counter to track activity     - goal to lose 5-10% of initial body weight     - avoid suagry drinks and juices, use zero calorie beverages     - increase water  intake      - eat a low carb diet with plenty of veggies and fruit     - Get sufficient sleep 7-8 hrs nightly     - maitain active lifestyle     - avoid alcohol      - recommend 2-3 cups Coffee daily     - Counsel on lowering cholesterol by  having a diet rich in vegetables,          protein (avoid red meats) and good fats(fish, salmon).   All questions were answered.      Nakeeta Sebastiani Faizan Janeah Kovacich, MD Gastroenterology and Hepatology [facility] Gastroenterology   This chart has been completed using [employer] Dictation software, and while attempts have been made to ensure accuracy , certain words and phrases may not be transcribed as intended

## 2024-07-15 NOTE — Patient Instructions (Signed)
It was very nice to meet you today, as dicussed with will plan for the following :  1) Colonoscopy

## 2024-07-15 NOTE — Addendum Note (Signed)
 Addended by: JEANELL GRAEME RAMAN on: 07/15/2024 11:26 AM   Modules accepted: Orders

## 2024-07-15 NOTE — Telephone Encounter (Signed)
 Spoke with patient, scheduled TCS with Ahmed on 08/19/2024 at 2:00pm. Rx sent to pharmacy, instructions sent to mychart.

## 2024-07-15 NOTE — Telephone Encounter (Signed)
 ATC patient to schedule TCS, no answer. LVM.

## 2024-07-15 NOTE — Telephone Encounter (Signed)
 Pt called in. [facility] prep [facility] and should have went to [facility]. New rx sent.

## 2024-07-16 DIAGNOSIS — I781 Nevus, non-neoplastic: Secondary | ICD-10-CM | POA: Diagnosis not present

## 2024-07-16 DIAGNOSIS — L820 Inflamed seborrheic keratosis: Secondary | ICD-10-CM | POA: Diagnosis not present

## 2024-07-16 DIAGNOSIS — L814 Other melanin hyperpigmentation: Secondary | ICD-10-CM | POA: Diagnosis not present

## 2024-07-16 DIAGNOSIS — D485 Neoplasm of uncertain behavior of skin: Secondary | ICD-10-CM | POA: Diagnosis not present

## 2024-07-16 DIAGNOSIS — L821 Other seborrheic keratosis: Secondary | ICD-10-CM | POA: Diagnosis not present

## 2024-07-21 DIAGNOSIS — K259 Gastric ulcer, unspecified as acute or chronic, without hemorrhage or perforation: Secondary | ICD-10-CM | POA: Diagnosis not present

## 2024-07-21 DIAGNOSIS — E782 Mixed hyperlipidemia: Secondary | ICD-10-CM | POA: Diagnosis not present

## 2024-07-21 DIAGNOSIS — I25709 Atherosclerosis of coronary artery bypass graft(s), unspecified, with unspecified angina pectoris: Secondary | ICD-10-CM | POA: Diagnosis not present

## 2024-07-21 DIAGNOSIS — E1122 Type 2 diabetes mellitus with diabetic chronic kidney disease: Secondary | ICD-10-CM | POA: Diagnosis not present

## 2024-08-19 ENCOUNTER — Encounter (HOSPITAL_COMMUNITY)
Admission: RE | Disposition: A | Discharge status: Home / Self Care | Payer: Self-pay | Source: Home / Self Care | Attending: Gastroenterology

## 2024-08-19 ENCOUNTER — Ambulatory Visit (HOSPITAL_COMMUNITY)

## 2024-08-19 ENCOUNTER — Ambulatory Visit (HOSPITAL_COMMUNITY)
Admission: RE | Admit: 2024-08-19 | Discharge: 2024-08-19 | Disposition: A | Discharge status: Home / Self Care | Attending: Gastroenterology | Admitting: Gastroenterology

## 2024-08-19 ENCOUNTER — Encounter (INDEPENDENT_AMBULATORY_CARE_PROVIDER_SITE_OTHER): Payer: Self-pay | Admitting: *Deleted

## 2024-08-19 ENCOUNTER — Other Ambulatory Visit: Payer: Self-pay

## 2024-08-19 ENCOUNTER — Encounter (HOSPITAL_COMMUNITY): Payer: Self-pay | Admitting: Gastroenterology

## 2024-08-19 DIAGNOSIS — K6389 Other specified diseases of intestine: Secondary | ICD-10-CM

## 2024-08-19 DIAGNOSIS — D123 Benign neoplasm of transverse colon: Secondary | ICD-10-CM

## 2024-08-19 DIAGNOSIS — I252 Old myocardial infarction: Secondary | ICD-10-CM | POA: Diagnosis not present

## 2024-08-19 DIAGNOSIS — Q439 Congenital malformation of intestine, unspecified: Secondary | ICD-10-CM

## 2024-08-19 DIAGNOSIS — R195 Other fecal abnormalities: Secondary | ICD-10-CM

## 2024-08-19 DIAGNOSIS — Z1211 Encounter for screening for malignant neoplasm of colon: Secondary | ICD-10-CM

## 2024-08-19 DIAGNOSIS — K5730 Diverticulosis of large intestine without perforation or abscess without bleeding: Secondary | ICD-10-CM | POA: Diagnosis not present

## 2024-08-19 DIAGNOSIS — M1990 Unspecified osteoarthritis, unspecified site: Secondary | ICD-10-CM | POA: Insufficient documentation

## 2024-08-19 DIAGNOSIS — K219 Gastro-esophageal reflux disease without esophagitis: Secondary | ICD-10-CM | POA: Insufficient documentation

## 2024-08-19 DIAGNOSIS — I2510 Atherosclerotic heart disease of native coronary artery without angina pectoris: Secondary | ICD-10-CM | POA: Diagnosis not present

## 2024-08-19 DIAGNOSIS — K648 Other hemorrhoids: Secondary | ICD-10-CM | POA: Diagnosis not present

## 2024-08-19 DIAGNOSIS — I10 Essential (primary) hypertension: Secondary | ICD-10-CM | POA: Insufficient documentation

## 2024-08-19 DIAGNOSIS — Q438 Other specified congenital malformations of intestine: Secondary | ICD-10-CM | POA: Insufficient documentation

## 2024-08-19 DIAGNOSIS — K635 Polyp of colon: Secondary | ICD-10-CM | POA: Diagnosis not present

## 2024-08-19 LAB — HM COLONOSCOPY

## 2024-08-19 LAB — GLUCOSE, CAPILLARY: Glucose-Capillary: 156 mg/dL — ABNORMAL HIGH (ref 70–99)

## 2024-08-19 SURGERY — COLONOSCOPY
Anesthesia: General

## 2024-08-19 MED ORDER — LACTATED RINGERS IV SOLN: INTRAVENOUS | Status: DC

## 2024-08-19 MED ORDER — PROPOFOL 500 MG/50ML IV EMUL
INTRAVENOUS | Status: DC | PRN
  Administered 2024-08-19: 150 ug/kg/min via INTRAVENOUS

## 2024-08-19 MED ORDER — LIDOCAINE HCL 1 % IJ SOLN
INTRAMUSCULAR | Status: DC | PRN
  Administered 2024-08-19: 75 mg via INTRADERMAL

## 2024-08-19 MED ORDER — PROPOFOL 10 MG/ML IV BOLUS
INTRAVENOUS | Status: DC | PRN
  Administered 2024-08-19: 50 mg via INTRAVENOUS

## 2024-08-19 NOTE — Anesthesia Preprocedure Evaluation (Addendum)
 Anesthesia Evaluation  Patient identified by MRN, date of birth, ID band Patient awake    Reviewed: Allergy & Precautions, H&P , NPO status , Patient's Chart, lab work & pertinent test results  Airway Mallampati: II  TM Distance: >3 FB Neck ROM: Full    Dental no notable dental hx.    Pulmonary neg pulmonary ROS   Pulmonary exam normal breath sounds clear to auscultation       Cardiovascular hypertension, + angina  + CAD and + Past MI  Normal cardiovascular exam+ Valvular Problems/Murmurs  Rhythm:Regular Rate:Normal  Ef nl 2023 Cabg 2010 and 2017   Neuro/Psych negative neurological ROS  negative psych ROS   GI/Hepatic Neg liver ROS, PUD,GERD  ,,  Endo/Other  negative endocrine ROS    Renal/GU Renal disease  negative genitourinary   Musculoskeletal  (+) Arthritis ,    Abdominal   Peds negative pediatric ROS (+)  Hematology negative hematology ROS (+)   Anesthesia Other Findings   Reproductive/Obstetrics negative OB ROS                              Anesthesia Physical Anesthesia Plan  ASA: 2  Anesthesia Plan: General   Post-op Pain Management:    Induction: Intravenous  PONV Risk Score and Plan:   Airway Management Planned: Nasal Cannula  Additional Equipment:   Intra-op Plan:   Post-operative Plan:   Informed Consent: I have reviewed the patients History and Physical, chart, labs and discussed the procedure including the risks, benefits and alternatives for the proposed anesthesia with the patient or authorized representative who has indicated his/her understanding and acceptance.     Dental advisory given  Plan Discussed with: CRNA  Anesthesia Plan Comments:          Anesthesia Quick Evaluation

## 2024-08-19 NOTE — H&P (Signed)
 Primary Care Physician:  Toribio Jerel MATSU, MD Primary Gastroenterologist:  Dr. Cinderella  Pre-Procedure History & Physical: HPI:   Ricardo Jimenez is a 73 y.o. male with coronary artery disease status post CABG, CKD, GERD, hypertension, hyperlipidemia who presents for evaluation of Positive Cologuard   Patient reports regular bowel movements daily.  2 years ago he was admitted normal for bleeding gastric ulcer secondary to NSAID use as per patient and patient wife there was a follow-up appointment upper endoscopy done which showed healing of the ulcer The patient denies having any nausea, vomiting, fever, chills, hematochezia, melena, hematemesis, abdominal distention, abdominal pain, diarrhea, jaundice, pruritus or weight loss.   Labs from 0 05/29/2024 hemoglobin 13.4 platelet 197   Positive Cologuard test 05/2024   Last EGD:2022 [facility]  Esophagus: The esophagus appeared normal without signs of ulceration or inflammation. There were no masses noted. There were no varices seen.  Stomach: A 5-13mm clean based ulcer is present in the gastric antrum. The surrounding tissue is very edematous, however there were no stigmata of bleeding. Four biopsies were taken from the margins of this ulcer and surrounding gastric mucosa. The pylorus was inspected and appears normal. The endoscope passes easily into the duodenum. Retroflexed views of the GE junction are normal. Duodenum: The duodenal mucosa appears normal.    Last Colonoscopy:10+ years ago    FHx: neg for any gastrointestinal/liver disease, no malignancies Social: neg smoking, alcohol  or illicit drug use Surgical: no abdominal surgeries  Past Medical History:  Diagnosis Date   CAD (coronary artery disease)    a. 06/2009 Cath: 3VD, nondominant RCA;  b. 06/2009 CABG x 4: LIMA->LAD, VG->OM1, VG->OM3, VG->D1;  c. 03/2011 ETT: normal;  d. 12/2013 NSTEMI/Cath/PCI: LM nl, LAD 100p, LCX 100p, RCA nl, LIMA->LAD ok, VG->Diag 52m, VG->OM1 99d(3.5x18 Xience DES),  VG->OM3 100d, EF 55%.   GERD (gastroesophageal reflux disease)    Heart murmur    as a child   Horseshoe kidney    HTN (hypertension)    pt denies this   Hypercholesterolemia    Osteoarthritis    PUD (peptic ulcer disease)    Stones in the urinary tract     Past Surgical History:  Procedure Laterality Date   CARDIAC CATHETERIZATION  CABG   CARPAL TUNNEL RELEASE     bil   COLONOSCOPY     x 2   CORONARY ARTERY BYPASS GRAFT  2010   CORONARY ARTERY BYPASS GRAFT N/A 01/27/2015   Procedure: REDO CORONARY ARTERY BYPASS GRAFTING (CABG) x  three , using right internal mammary artery and bilateral radial arteries;  Surgeon: Dorise MARLA Fellers, MD;  Location: [facility] OR;  Service: Open Heart Surgery;  Laterality: N/A;   HERNIA REPAIR  2011   rt ing   KIDNEY STONE SURGERY Left    KNEE ARTHROSCOPY     rt x2, lft x3   LEFT HEART CATHETERIZATION WITH CORONARY/GRAFT ANGIOGRAM N/A 12/28/2013   Procedure: LEFT HEART CATHETERIZATION WITH EL BILE;  Surgeon: Ezra GORMAN Shuck, MD;  Location: [facility] CATH LAB;  Service: Cardiovascular;  Laterality: N/A;   LEFT HEART CATHETERIZATION WITH CORONARY/GRAFT ANGIOGRAM N/A 09/19/2014   Procedure: LEFT HEART CATHETERIZATION WITH EL BILE;  Surgeon: Deatrice DELENA Cage, MD;  Location: [facility] CATH LAB;  Service: Cardiovascular;  Laterality: N/A;   OTHER SURGICAL HISTORY     BIL.CARPAL TUNNEL SURG.   RADIAL ARTERY HARVEST Bilateral 01/27/2015   Procedure: BILATERAL RADIAL ARTERY HARVEST;  Surgeon: Dorise MARLA Fellers, MD;  Location: [facility] OR;  Service: Open Heart Surgery;  Laterality: Bilateral;  BILATERAL RADIAL HARVEST & RIGHT IMA   RT.KNEE REPLACEMENT     TEE WITHOUT CARDIOVERSION N/A 01/27/2015   Procedure: TRANSESOPHAGEAL ECHOCARDIOGRAM (TEE);  Surgeon: Dorise MARLA Fellers, MD;  Location: [facility] OR;  Service: Open Heart Surgery;  Laterality: N/A;   TOTAL KNEE ARTHROPLASTY  02/11/2012   Procedure: TOTAL KNEE ARTHROPLASTY;  Surgeon: Garnette JONETTA Raman, MD;  Location: [facility] OR;   Service: Orthopedics;  Laterality: Left;  left total knee replacement   VASECTOMY      Prior to Admission medications   Medication Sig Start Date End Date Taking? Authorizing Provider  acetaminophen  (TYLENOL ) 650 MG CR tablet Take 1,300 mg by mouth every morning.    Yes [provider]  aspirin  EC 81 MG tablet Take 81 mg by mouth daily.   Yes [provider]  atorvastatin  (LIPITOR ) 80 MG tablet TAKE 1 TABLET BY MOUTH EVERY DAY 01/04/23  Yes Strader, Brittany M, PA-C  diphenhydramine -acetaminophen  (TYLENOL  PM) 25-500 MG TABS tablet Take 1 tablet by mouth at bedtime as needed. For sleep   Yes [provider]  fenofibrate  54 MG tablet Take 1 tablet (54 mg total) by mouth daily. 09/14/19  Yes Charls Pearla LABOR, MD  gabapentin  (NEURONTIN ) 300 MG capsule Take 300-600 mg by mouth 2 (two) times daily. Take 300 mg in the morning and 600 mg at bedtime    Yes [provider]  glucosamine-chondroitin 500-400 MG tablet Take 1 tablet by mouth 2 (two) times daily.    Yes [provider]  metFORMIN (GLUCOPHAGE-XR) 500 MG 24 hr tablet Take 1,000 mg by mouth daily. 05/15/24  Yes [provider]  metoprolol  tartrate (LOPRESSOR ) 25 MG tablet Take 25 mg by mouth 2 (two) times daily.  12/15/11  Yes [provider]  omega-3 acid ethyl esters (LOVAZA) 1 g capsule Take 2 g by mouth 2 (two) times daily.   Yes [provider]  potassium citrate  (UROCIT-K ) 5 MEQ (540 MG) SR tablet Take 10 mEq by mouth 2 (two) times daily.    Yes [provider]  traMADol  (ULTRAM ) 50 MG tablet Take 50 mg by mouth as needed.    Yes [provider]  UNABLE TO FIND Med Name: Relaxium 10 mg tablet. 1 at bedtime   Yes [provider]  Multiple Vitamin (MULTIVITAMIN) tablet Take 1 tablet by mouth daily.      [provider]  nitroGLYCERIN  (NITROSTAT ) 0.4 MG SL tablet Place 1 tablet (0.4 mg total) under the tongue every 5 (five) minutes as  needed for chest pain. 12/16/12   Delford Maude BROCKS, MD  Sodium Sulfate-Mag Sulfate-KCl (SUTAB ) [phone] MG TABS 1 kit as directed 07/15/24   Cinderella Deatrice FALCON, MD    Allergies as of 07/15/2024   (No Known Allergies)    Family History  Problem Relation Age of Onset   COPD Father        AGE 28   Hypertension Mother     Social History   Socioeconomic History   Marital status: Married    Spouse name: Not on file   Number of children: Not on file   Years of education: Not on file   Highest education level: Not on file  Occupational History   Not on file  Tobacco Use   Smoking status: Never   Smokeless tobacco: Never  Vaping Use   Vaping status: Never Used  Substance and Sexual Activity   Alcohol  use: Yes  Alcohol /week: 0.0 standard drinks of alcohol     Comment: 07/22/14 Beer every now and then- AJ   Drug use: No   Sexual activity: Yes    Partners: Female  Other Topics Concern   Not on file  Social History Narrative   Married and lives in [place], [place] with his wife.  2 children.  Works as Teacher, Music.   Social Drivers of Health   Financial Resource Strain: Low Risk  (11/16/2022)   Received from [facility]   Overall Financial Resource Strain (CARDIA)    Difficulty of Paying Living Expenses: Not very hard  Food Insecurity: Not on file  Transportation Needs: Not on file  Physical Activity: Not on file  Stress: No Stress Concern Present (11/16/2022)   Received from [facility] - Occupational Stress Questionnaire    Feeling of Stress : Not at all  Social Connections: Unknown (11/16/2022)   Received from [facility]   Social Network    Social Network: Not on file  Intimate Partner Violence: Unknown (11/16/2022)   Received from [facility]   HITS    Physically Hurt: Not on file    Insult or Talk Down To: Not on file    Threaten Physical Harm: Not on file    Scream or Curse: Not on file    Review of  Systems: See HPI, otherwise negative ROS  Physical Exam: Vital signs in last 24 hours: Temp:  [98.4 F (36.9 C)] 98.4 F (36.9 C) (10/29 1140) Pulse Rate:  [65] 65 (10/29 1140) Resp:  [20] 20 (10/29 1140) BP: (145)/(86) 145/86 (10/29 1140) SpO2:  [95 %] 95 % (10/29 1140) Weight:  [104.3 kg] 104.3 kg (10/29 1140)   General:   Alert,  Well-developed, well-nourished, pleasant and cooperative in NAD Head:  Normocephalic and atraumatic. Eyes:  Sclera clear, no icterus.   Conjunctiva pink. Ears:  Normal auditory acuity. Nose:  No deformity, discharge,  or lesions. Msk:  Symmetrical without gross deformities. Normal posture. Extremities:  Without clubbing or edema. Neurologic:  Alert and  oriented x4;  grossly normal neurologically. Skin:  Intact without significant lesions or rashes. Psych:  Alert and cooperative. Normal mood and affect.  Impression/Plan: Ricardo Jimenez is a 73 y.o. male with coronary artery disease status post CABG, CKD, GERD, hypertension, hyperlipidemia who presents for evaluation of Positive Cologuard  Proceed with colonoscopy   The risks of the procedure including infection, bleed, or perforation as well as benefits, limitations, alternatives and imponderables have been reviewed with the patient. Questions have been answered. All parties agreeable.

## 2024-08-19 NOTE — Discharge Instructions (Signed)

## 2024-08-19 NOTE — Anesthesia Postprocedure Evaluation (Signed)
 Anesthesia Post Note  Patient: Ricardo Jimenez  Procedure(s) Performed: COLONOSCOPY  Patient location during evaluation: PACU Anesthesia Type: General Level of consciousness: awake and alert Pain management: pain level controlled Vital Signs Assessment: post-procedure vital signs reviewed and stable Respiratory status: spontaneous breathing, nonlabored ventilation, respiratory function stable and patient connected to nasal cannula oxygen Cardiovascular status: blood pressure returned to baseline and stable Postop Assessment: no apparent nausea or vomiting Anesthetic complications: no   No notable events documented.   Last Vitals:  Vitals:   08/19/24 1140 08/19/24 1328  BP: (!) 145/86 122/71  Pulse: 65 68  Resp: 20   Temp: 36.9 C 36.8 C  SpO2: 95% 96%    Last Pain:  Vitals:   08/19/24 1328  TempSrc: Oral  PainSc:                  Andrea Limes

## 2024-08-19 NOTE — Op Note (Signed)
 [facility] Patient Name: Ricardo Jimenez Procedure Date: 08/19/2024 12:31 PM MRN: [id number] Date of Birth: [birth year] Attending MD: Deatrice Dine , MD, [id number] CSN: [id number] Age: 73 Admit Type: Outpatient Procedure:                Colonoscopy Indications:              Positive Cologuard test Providers:                Deatrice Dine, MD, Devere Lodge, Dorcas Lenis,                            Technician Referring MD:              Medicines:                Monitored Anesthesia Care Complications:            No immediate complications. Estimated Blood Loss:     Estimated blood loss was minimal. Procedure:                Pre-Anesthesia Assessment:                           - Prior to the procedure, a History and Physical                            was performed, and patient medications and                            allergies were reviewed. The patient's tolerance of                            previous anesthesia was also reviewed. The risks                            and benefits of the procedure and the sedation                            options and risks were discussed with the patient.                            All questions were answered, and informed consent                            was obtained. Prior Anticoagulants: The patient has                            taken no anticoagulant or antiplatelet agents                            except for aspirin . ASA Grade Assessment: II - A                            patient with mild systemic disease. After reviewing  the risks and benefits, the patient was deemed in                            satisfactory condition to undergo the procedure.                           After obtaining informed consent, the colonoscope                            was passed under direct vision. Throughout the                            procedure, the patient's blood pressure, pulse, and                            oxygen  saturations were monitored continuously. The                            [id number]-[id number] ([id number]) Colon was introduced through                            the anus and advanced to the the cecum, identified                            by appendiceal orifice and ileocecal valve. The                            ileocecal valve, appendiceal orifice, and rectum                            were photographed. Scope In: 12:44:46 PM Scope Out: 1:21:25 PM Scope Withdrawal Time: 0 hours 23 minutes 53 seconds  Total Procedure Duration: 0 hours 36 minutes 39 seconds  Findings:      The perianal and digital rectal examinations were normal.      A 6 mm polyp was found in the transverse colon. The polyp was sessile.       The polyp was removed with a cold snare. Resection and retrieval were       complete.      Scattered large-mouthed diverticula were found in the left colon.      The left colon was moderately tortuous.      A diffuse area of severe melanosis was found in the entire colon.       Biopsies were taken with a cold forceps for histology.      Non-bleeding internal hemorrhoids were found during retroflexion. The       hemorrhoids were small. Impression:               - One 6 mm polyp in the transverse colon, removed                            with a cold snare. Resected and retrieved.                           - Diverticulosis in the left colon.                           -  Tortuous colon.                           - Melanosis in the colon. Biopsied.                           - Non-bleeding internal hemorrhoids. Moderate Sedation:      Per Anesthesia Care Recommendation:           - Patient has a contact number available for                            emergencies. The signs and symptoms of potential                            delayed complications were discussed with the                            patient. Return to normal activities tomorrow.                            Written discharge  instructions were provided to the                            patient.                           - Resume previous diet.                           - Continue present medications.                           - Await pathology results.                           - Repeat colonoscopy in 7-10 years for surveillance                            based on pathology results; if medically fit.                           - Return to primary care physician as previously                            scheduled. Procedure Code(s):        --- Professional ---                           [phone], Colonoscopy, flexible; with removal of                            tumor(s), polyp(s), or other lesion(s) by snare                            technique  [id number], 59, Colonoscopy, flexible; with biopsy,                            single or multiple Diagnosis Code(s):        --- Professional ---                           D12.3, Benign neoplasm of transverse colon (hepatic                            flexure or splenic flexure)                           K64.8, Other hemorrhoids                           K63.89, Other specified diseases of intestine                           R19.5, Other fecal abnormalities                           K57.30, Diverticulosis of large intestine without                            perforation or abscess without bleeding                           Q43.8, Other specified congenital malformations of                            intestine CPT copyright 2022 [employer]. All rights reserved. The codes documented in this report are preliminary and upon coder review may  be revised to meet current compliance requirements. Deatrice Dine, MD Deatrice Dine, MD 08/19/2024 1:29:10 PM This report has been signed electronically. Number of Addenda: 0

## 2024-08-19 NOTE — Transfer of Care (Signed)
 Immediate Anesthesia Transfer of Care Note  Patient: Ricardo Jimenez  Procedure(s) Performed: COLONOSCOPY  Patient Location: PACU  Anesthesia Type:General  Level of Consciousness: awake  Airway & Oxygen Therapy: Patient Spontanous Breathing  Post-op Assessment: Report given to RN  Post vital signs: Reviewed and stable  Last Vitals:  Vitals Value Taken Time  BP    Temp    Pulse    Resp    SpO2      Last Pain:  Vitals:   08/19/24 1236  TempSrc:   PainSc: 0-No pain      Patients Stated Pain Goal: 5 (08/19/24 1140)  Complications: No notable events documented.

## 2024-08-21 ENCOUNTER — Encounter (HOSPITAL_COMMUNITY): Payer: Self-pay | Admitting: Gastroenterology

## 2024-08-21 DIAGNOSIS — E782 Mixed hyperlipidemia: Secondary | ICD-10-CM | POA: Diagnosis not present

## 2024-08-21 DIAGNOSIS — K259 Gastric ulcer, unspecified as acute or chronic, without hemorrhage or perforation: Secondary | ICD-10-CM | POA: Diagnosis not present

## 2024-08-21 DIAGNOSIS — E1122 Type 2 diabetes mellitus with diabetic chronic kidney disease: Secondary | ICD-10-CM | POA: Diagnosis not present

## 2024-08-21 DIAGNOSIS — I25709 Atherosclerosis of coronary artery bypass graft(s), unspecified, with unspecified angina pectoris: Secondary | ICD-10-CM | POA: Diagnosis not present

## 2024-08-21 LAB — SURGICAL PATHOLOGY

## 2024-08-24 ENCOUNTER — Ambulatory Visit (INDEPENDENT_AMBULATORY_CARE_PROVIDER_SITE_OTHER): Payer: Self-pay | Admitting: Gastroenterology

## 2024-08-24 NOTE — Progress Notes (Signed)
 7 yr TCS noted in recall Patient result letter mailed procedure note and pathology result faxed to PCP

## 2024-09-18 DIAGNOSIS — E1122 Type 2 diabetes mellitus with diabetic chronic kidney disease: Secondary | ICD-10-CM | POA: Diagnosis not present

## 2024-09-18 DIAGNOSIS — I25709 Atherosclerosis of coronary artery bypass graft(s), unspecified, with unspecified angina pectoris: Secondary | ICD-10-CM | POA: Diagnosis not present

## 2024-09-18 DIAGNOSIS — K259 Gastric ulcer, unspecified as acute or chronic, without hemorrhage or perforation: Secondary | ICD-10-CM | POA: Diagnosis not present

## 2024-09-18 DIAGNOSIS — E782 Mixed hyperlipidemia: Secondary | ICD-10-CM | POA: Diagnosis not present
